# Patient Record
Sex: Male | Born: 1955 | Race: White | Hispanic: No | Smoking: Current every day smoker
Health system: Southern US, Community
[De-identification: ages and names within clinical notes are randomized; demographics above are authoritative.]

## PROBLEM LIST (undated history)

## (undated) DIAGNOSIS — N179 Acute kidney failure, unspecified: Secondary | ICD-10-CM

## (undated) DIAGNOSIS — R188 Other ascites: Secondary | ICD-10-CM

## (undated) DIAGNOSIS — F1020 Alcohol dependence, uncomplicated: Secondary | ICD-10-CM

## (undated) DIAGNOSIS — I313 Pericardial effusion (noninflammatory): Secondary | ICD-10-CM

## (undated) DIAGNOSIS — N390 Urinary tract infection, site not specified: Secondary | ICD-10-CM

## (undated) DIAGNOSIS — C3490 Malignant neoplasm of unspecified part of unspecified bronchus or lung: Secondary | ICD-10-CM

## (undated) DIAGNOSIS — B1920 Unspecified viral hepatitis C without hepatic coma: Secondary | ICD-10-CM

## (undated) DIAGNOSIS — M899 Disorder of bone, unspecified: Secondary | ICD-10-CM

## (undated) DIAGNOSIS — C229 Malignant neoplasm of liver, not specified as primary or secondary: Secondary | ICD-10-CM

## (undated) DIAGNOSIS — K7460 Unspecified cirrhosis of liver: Secondary | ICD-10-CM

## (undated) HISTORY — DX: Unspecified cirrhosis of liver: K74.60

## (undated) HISTORY — DX: Other ascites: R18.8

## (undated) HISTORY — DX: Malignant neoplasm of liver, not specified as primary or secondary: C22.9

## (undated) HISTORY — DX: Malignant neoplasm of unspecified part of unspecified bronchus or lung: C34.90

---

## 2013-02-07 ENCOUNTER — Inpatient Hospital Stay (HOSPITAL_COMMUNITY)
Admission: EM | Admit: 2013-02-07 | Discharge: 2013-02-13 | DRG: 315 | Disposition: A | Discharge status: Home / Self Care | Payer: BC Managed Care – PPO | Attending: Internal Medicine | Admitting: Internal Medicine

## 2013-02-07 ENCOUNTER — Inpatient Hospital Stay (HOSPITAL_COMMUNITY): Payer: BC Managed Care – PPO

## 2013-02-07 ENCOUNTER — Emergency Department (HOSPITAL_COMMUNITY): Payer: BC Managed Care – PPO

## 2013-02-07 ENCOUNTER — Encounter (HOSPITAL_COMMUNITY): Payer: Self-pay | Admitting: Emergency Medicine

## 2013-02-07 DIAGNOSIS — F1010 Alcohol abuse, uncomplicated: Secondary | ICD-10-CM

## 2013-02-07 DIAGNOSIS — N179 Acute kidney failure, unspecified: Secondary | ICD-10-CM

## 2013-02-07 DIAGNOSIS — K529 Noninfective gastroenteritis and colitis, unspecified: Secondary | ICD-10-CM

## 2013-02-07 DIAGNOSIS — M899 Disorder of bone, unspecified: Secondary | ICD-10-CM

## 2013-02-07 DIAGNOSIS — I313 Pericardial effusion (noninflammatory): Secondary | ICD-10-CM

## 2013-02-07 DIAGNOSIS — K920 Hematemesis: Secondary | ICD-10-CM | POA: Diagnosis present

## 2013-02-07 DIAGNOSIS — R188 Other ascites: Secondary | ICD-10-CM | POA: Diagnosis present

## 2013-02-07 DIAGNOSIS — I318 Other specified diseases of pericardium: Secondary | ICD-10-CM

## 2013-02-07 DIAGNOSIS — N390 Urinary tract infection, site not specified: Secondary | ICD-10-CM

## 2013-02-07 DIAGNOSIS — F10239 Alcohol dependence with withdrawal, unspecified: Secondary | ICD-10-CM | POA: Diagnosis present

## 2013-02-07 DIAGNOSIS — D696 Thrombocytopenia, unspecified: Secondary | ICD-10-CM | POA: Diagnosis present

## 2013-02-07 DIAGNOSIS — F17200 Nicotine dependence, unspecified, uncomplicated: Secondary | ICD-10-CM | POA: Diagnosis present

## 2013-02-07 DIAGNOSIS — R079 Chest pain, unspecified: Secondary | ICD-10-CM

## 2013-02-07 DIAGNOSIS — M898X9 Other specified disorders of bone, unspecified site: Secondary | ICD-10-CM

## 2013-02-07 DIAGNOSIS — I3139 Other pericardial effusion (noninflammatory): Secondary | ICD-10-CM

## 2013-02-07 DIAGNOSIS — B1920 Unspecified viral hepatitis C without hepatic coma: Secondary | ICD-10-CM | POA: Diagnosis present

## 2013-02-07 DIAGNOSIS — K7030 Alcoholic cirrhosis of liver without ascites: Secondary | ICD-10-CM | POA: Diagnosis present

## 2013-02-07 DIAGNOSIS — R791 Abnormal coagulation profile: Secondary | ICD-10-CM | POA: Diagnosis not present

## 2013-02-07 DIAGNOSIS — J9819 Other pulmonary collapse: Secondary | ICD-10-CM | POA: Diagnosis present

## 2013-02-07 DIAGNOSIS — R7989 Other specified abnormal findings of blood chemistry: Secondary | ICD-10-CM

## 2013-02-07 DIAGNOSIS — F1020 Alcohol dependence, uncomplicated: Secondary | ICD-10-CM | POA: Diagnosis present

## 2013-02-07 DIAGNOSIS — J90 Pleural effusion, not elsewhere classified: Secondary | ICD-10-CM | POA: Diagnosis present

## 2013-02-07 DIAGNOSIS — R700 Elevated erythrocyte sedimentation rate: Secondary | ICD-10-CM

## 2013-02-07 DIAGNOSIS — I319 Disease of pericardium, unspecified: Principal | ICD-10-CM | POA: Diagnosis present

## 2013-02-07 DIAGNOSIS — F10939 Alcohol use, unspecified with withdrawal, unspecified: Secondary | ICD-10-CM | POA: Diagnosis present

## 2013-02-07 HISTORY — DX: Disorder of bone, unspecified: M89.9

## 2013-02-07 HISTORY — DX: Pericardial effusion (noninflammatory): I31.3

## 2013-02-07 HISTORY — DX: Acute kidney failure, unspecified: N17.9

## 2013-02-07 HISTORY — DX: Other pericardial effusion (noninflammatory): I31.39

## 2013-02-07 HISTORY — DX: Other specified disorders of bone, unspecified site: M89.8X9

## 2013-02-07 HISTORY — DX: Urinary tract infection, site not specified: N39.0

## 2013-02-07 HISTORY — DX: Unspecified viral hepatitis C without hepatic coma: B19.20

## 2013-02-07 HISTORY — DX: Alcohol dependence, uncomplicated: F10.20

## 2013-02-07 LAB — CBC
MCHC: 35.1 g/dL (ref 30.0–36.0)
Platelets: 183 10*3/uL (ref 150–400)
RBC: 4.06 MIL/uL — ABNORMAL LOW (ref 4.22–5.81)
RDW: 12.7 % (ref 11.5–15.5)

## 2013-02-07 LAB — CBC WITH DIFFERENTIAL/PLATELET
Eosinophils Absolute: 0 10*3/uL (ref 0.0–0.7)
Eosinophils Relative: 0 % (ref 0–5)
HCT: 46.4 % (ref 39.0–52.0)
Hemoglobin: 15.6 g/dL (ref 13.0–17.0)
Lymphocytes Relative: 5 % — ABNORMAL LOW (ref 12–46)
Lymphs Abs: 1.1 10*3/uL (ref 0.7–4.0)
MCH: 33.8 pg (ref 26.0–34.0)
MCV: 100.7 fL — ABNORMAL HIGH (ref 78.0–100.0)
Monocytes Absolute: 1.3 10*3/uL — ABNORMAL HIGH (ref 0.1–1.0)
Platelets: 229 10*3/uL (ref 150–400)
RDW: 12.6 % (ref 11.5–15.5)
WBC: 24.2 10*3/uL — ABNORMAL HIGH (ref 4.0–10.5)

## 2013-02-07 LAB — TROPONIN I
Troponin I: <0.3 ng/mL (ref <0.30)
Troponin I: <0.3 ng/mL (ref <0.30)
Troponin I: <0.3 ng/mL (ref <0.30)

## 2013-02-07 LAB — COMPREHENSIVE METABOLIC PANEL
ALT: 152 U/L — ABNORMAL HIGH (ref 0–53)
Calcium: 9.1 mg/dL (ref 8.4–10.5)
Creatinine, Ser: 1.95 mg/dL — ABNORMAL HIGH (ref 0.50–1.35)
GFR calc Af Amer: 43 mL/min — ABNORMAL LOW (ref >90)
GFR calc non Af Amer: 37 mL/min — ABNORMAL LOW (ref >90)
Glucose, Bld: 196 mg/dL — ABNORMAL HIGH (ref 70–99)
Sodium: 133 mEq/L — ABNORMAL LOW (ref 135–145)
Total Protein: 8.2 g/dL (ref 6.0–8.3)

## 2013-02-07 LAB — LIPASE, BLOOD: Lipase: 15 U/L (ref 11–59)

## 2013-02-07 LAB — URINALYSIS, ROUTINE W REFLEX MICROSCOPIC
Bilirubin Urine: NEGATIVE
Leukocytes, UA: NEGATIVE
Nitrite: POSITIVE — AB
Protein, ur: 30 mg/dL — AB
Urobilinogen, UA: 4 mg/dL — ABNORMAL HIGH (ref 0.0–1.0)
pH: 5 (ref 5.0–8.0)

## 2013-02-07 LAB — URINE MICROSCOPIC-ADD ON

## 2013-02-07 MED ORDER — ONDANSETRON HCL 4 MG PO TABS: 4.0000 mg | ORAL_TABLET | Freq: Four times a day (QID) | ORAL | Status: DC | PRN

## 2013-02-07 MED ORDER — SODIUM CHLORIDE 0.9 % IJ SOLN
3.0000 mL | Freq: Two times a day (BID) | INTRAMUSCULAR | Status: DC
  Administered 2013-02-07: 3 mL via INTRAVENOUS
  Administered 2013-02-08: 11:00:00 via INTRAVENOUS
  Administered 2013-02-08 – 2013-02-12 (×4): 3 mL via INTRAVENOUS

## 2013-02-07 MED ORDER — HYDROCODONE-ACETAMINOPHEN 5-325 MG PO TABS: 1.0000 | ORAL_TABLET | ORAL | Status: DC | PRN

## 2013-02-07 MED ORDER — SODIUM CHLORIDE 0.9 % IV SOLN: INTRAVENOUS | Status: DC

## 2013-02-07 MED ORDER — SODIUM CHLORIDE 0.9 % IV SOLN
250.0000 mL | INTRAVENOUS | Status: DC | PRN
  Administered 2013-02-07: 250 mL via INTRAVENOUS

## 2013-02-07 MED ORDER — SODIUM CHLORIDE 0.9 % IV BOLUS (SEPSIS)
1000.0000 mL | Freq: Once | INTRAVENOUS | Status: AC
  Administered 2013-02-07: 1000 mL via INTRAVENOUS

## 2013-02-07 MED ORDER — FOLIC ACID 1 MG PO TABS
1.0000 mg | ORAL_TABLET | Freq: Every day | ORAL | Status: DC
  Administered 2013-02-07 – 2013-02-13 (×7): 1 mg via ORAL
  Filled 2013-02-07 (×7): qty 1

## 2013-02-07 MED ORDER — SODIUM CHLORIDE 0.9 % IJ SOLN
3.0000 mL | Freq: Two times a day (BID) | INTRAMUSCULAR | Status: DC
  Administered 2013-02-09 – 2013-02-12 (×3): 3 mL via INTRAVENOUS

## 2013-02-07 MED ORDER — PANTOPRAZOLE SODIUM 40 MG IV SOLR
40.0000 mg | Freq: Once | INTRAVENOUS | Status: AC
  Administered 2013-02-07: 40 mg via INTRAVENOUS
  Filled 2013-02-07: qty 40

## 2013-02-07 MED ORDER — ONDANSETRON HCL 4 MG/2ML IJ SOLN: 4.0000 mg | Freq: Four times a day (QID) | INTRAMUSCULAR | Status: DC | PRN

## 2013-02-07 MED ORDER — PANTOPRAZOLE SODIUM 40 MG IV SOLR
40.0000 mg | Freq: Two times a day (BID) | INTRAVENOUS | Status: DC
  Administered 2013-02-07 – 2013-02-09 (×4): 40 mg via INTRAVENOUS
  Filled 2013-02-07 (×5): qty 40

## 2013-02-07 MED ORDER — VITAMIN B-1 100 MG PO TABS
100.0000 mg | ORAL_TABLET | Freq: Every day | ORAL | Status: DC
  Administered 2013-02-07 – 2013-02-13 (×7): 100 mg via ORAL
  Filled 2013-02-07 (×7): qty 1

## 2013-02-07 MED ORDER — IOHEXOL 300 MG/ML  SOLN
50.0000 mL | Freq: Once | INTRAMUSCULAR | Status: AC | PRN
  Administered 2013-02-07: 50 mL via ORAL

## 2013-02-07 MED ORDER — DEXTROSE 5 % IV SOLN
1.0000 g | INTRAVENOUS | Status: DC
  Administered 2013-02-07 – 2013-02-12 (×6): 1 g via INTRAVENOUS
  Filled 2013-02-07 (×8): qty 10

## 2013-02-07 MED ORDER — HYDROMORPHONE HCL PF 1 MG/ML IJ SOLN
1.0000 mg | Freq: Once | INTRAMUSCULAR | Status: AC
  Administered 2013-02-07: 1 mg via INTRAVENOUS
  Filled 2013-02-07: qty 1

## 2013-02-07 MED ORDER — ONDANSETRON HCL 4 MG/2ML IJ SOLN
4.0000 mg | Freq: Once | INTRAMUSCULAR | Status: AC
  Administered 2013-02-07: 4 mg via INTRAVENOUS
  Filled 2013-02-07: qty 2

## 2013-02-07 MED ORDER — LORAZEPAM 2 MG/ML IJ SOLN
2.0000 mg | INTRAMUSCULAR | Status: DC | PRN
  Administered 2013-02-08: 2 mg via INTRAVENOUS
  Filled 2013-02-07: qty 1

## 2013-02-07 MED ORDER — SODIUM CHLORIDE 0.9 % IJ SOLN: 3.0000 mL | INTRAMUSCULAR | Status: DC | PRN

## 2013-02-07 MED ORDER — DOCUSATE SODIUM 100 MG PO CAPS
100.0000 mg | ORAL_CAPSULE | Freq: Two times a day (BID) | ORAL | Status: DC
  Administered 2013-02-07 – 2013-02-13 (×5): 100 mg via ORAL
  Filled 2013-02-07 (×13): qty 1

## 2013-02-07 NOTE — ED Notes (Addendum)
**Note De-Identified Strupp Obfuscation** Has had upper chest rash x 8-10 years.  Last night at about 2100 had sudden onset of sharp pain in upper chest and nausea. Saw some dark brown particles in emesis. States "feels like a bear is inside of me". Has been unable to keep anything down.  Diaphoresis and lightheadedness.

## 2013-02-07 NOTE — ED Notes (Signed)
**Note De-identified [place]an Obfuscation** Patient left with [facility] at this time 

## 2013-02-07 NOTE — ED Notes (Signed)
**Note De-identified [place] Obfuscation** [facility] at bedside 

## 2013-02-07 NOTE — H&P (Signed)
**Note De-Identified [place] Obfuscation** PCP:  No PCP Per Patient    Chief Complaint:  Chest pain  HPI: Marc Wilson is a 57 y.o. male   has a past medical history of Alcoholism.   Presented with  For the past 3-4 months he has not been feeling well. He does not go to the doctor. Reports drinking 12 beers a day but ready to quit. Stopped smoking yesterday. Yesterday his nausea got worse he attributes that to increased cough. When he vomitted he noted vomit to be black. Denies frank blood. Denies melena. This was an isolated event.  He started to have severe chest pain at night after he was doing a lot of strenuous activity at work. This was associated with some shortness of breath. Pain was non-positional. He presented to [facility] and had a CT scan done that showed no PE but was worrisome for pericardial effusion. Pain is now greatly improved. Pateint was transferred to [facility] stepdown.  He reports occasional shakes he he has not had enough alcohol. He never tried to quit. He reports last etOH was 3 days ago.   Review of Systems:     Pertinent positives include:  chest pain, shortness of breath at rest. nausea, vomiting,  Constitutional:  No weight loss, night sweats, Fevers, chills, fatigue, weight loss  HEENT:  No headaches, Difficulty swallowing,Tooth/dental problems,Sore throat,  No sneezing, itching, ear ache, nasal congestion, post nasal drip,  Cardio-vascular:  No Orthopnea, PND, anasarca, dizziness, palpitations.no Bilateral lower extremity swelling  GI:  No heartburn, indigestion, abdominal pain,  diarrhea, change in bowel habits, loss of appetite, melena, blood in stool, hematemesis Resp:  no  No dyspnea on exertion, No excess mucus, no productive cough, No non-productive cough, No coughing up of blood.No change in color of mucus.No wheezing. Skin:  no rash or lesions. No jaundice GU:  no dysuria, change in color of urine, no urgency or frequency. No straining to urinate.  No flank pain.  Musculoskeletal:  No joint pain  or no joint swelling. No decreased range of motion. No back pain.  Psych:  No change in mood or affect. No depression or anxiety. No memory loss.  Neuro: no localizing neurological complaints, no tingling, no weakness, no double vision, no gait abnormality, no slurred speech, no confusion  Otherwise ROS are negative except for above, 10 systems were reviewed  Past Medical History: Past Medical History  Diagnosis Date  . Alcoholism    History reviewed. No pertinent past surgical history.   Medications: Prior to Admission medications   Medication Sig Start Date End Date Taking? Authorizing Provider  Aspirin-Caffeine (BAYER BACK & BODY PAIN EX ST PO) Take 2 tablets by mouth 2 (two) times daily as needed (pain).   Yes Historical Provider, MD    Allergies:  No Known Allergies  Social History:  Ambulatory independently   Lives at   Home with family   reports that he quit smoking yesterday. His smoking use included Cigarettes. He smoked 2.00 packs per day. He does not have any smokeless tobacco history on file. He reports that he drinks about 7.2 ounces of alcohol per week. He reports that he uses illicit drugs (Marijuana).   Family History: family history includes Alcoholism in his father; Diabetes type II in his father and mother.    Physical Exam: Patient Vitals for the past 24 hrs:  BP Temp Temp src Pulse Resp SpO2 Height Weight  02/07/13 2000 126/94 mmHg - - 97 24 94 % - -  02/07/13 1955 - **Note De-Identified Memmott Obfuscation** 99 F (37.2 C) Oral - - - - -  02/07/13 1900 124/83 mmHg 98.8 F (37.1 C) Oral 103 19 89 % 5\' 9"  (1.753 m) 83 kg (182 lb 15.7 oz)  02/07/13 1800 129/84 mmHg - - 97 25 88 % - -  02/07/13 1700 103/90 mmHg - - 99 24 91 % - -  02/07/13 1617 116/94 mmHg - - 101 22 93 % - -  02/07/13 1303 106/90 mmHg - - 105 22 93 % - -  02/07/13 1300 110/84 mmHg - - 101 25 92 % - -  02/07/13 1027 102/77 mmHg 97.7 F (36.5 C) Oral 113 17 98 % 5\' 9"  (1.753 m) 77.111 kg (170 lb)    1. General:  in No Acute  distress 2. Psychological: Alert and Oriented 3. Head/ENT:   Moist  Mucous Membranes                          Head Non traumatic, neck supple                           Poor Dentition 4. SKIN:   decreased Skin turgor,  Skin clean Dry and intact, areas of white plaques over chest head and neck 5. Heart: Regular rate and rhythm no Murmur, Rub or gallop 6. Lungs:   no wheezes occasional crackles, somewhat distant   7. Abdomen: Soft, non-tender, Non distended 8. Lower extremities: no clubbing, cyanosis, or edema 9. Neurologically Grossly intact, moving all 4 extremities equally, no tremor 10. MSK: Normal range of motion  body mass index is 27.01 kg/(m^2).   Labs on Admission:   Recent Labs  02/07/13 1051  NA 133*  K 4.4  CL 91*  CO2 21  GLUCOSE 196*  BUN 19  CREATININE 1.95*  CALCIUM 9.1    Recent Labs  02/07/13 1051  AST 337*  ALT 152*  ALKPHOS 99  BILITOT 0.9  PROT 8.2  ALBUMIN 3.4*    Recent Labs  02/07/13 1051  LIPASE 15    Recent Labs  02/07/13 1051  WBC 24.2*  NEUTROABS 21.8*  HGB 15.6  HCT 46.4  MCV 100.7*  PLT 229    Recent Labs  02/07/13 1051 02/07/13 1457  TROPONINI <0.30 <0.30   No results found for this basename: TSH, T4TOTAL, FREET3, T3FREE, THYROIDAB,  in the last 72 hours No results found for this basename: VITAMINB12, FOLATE, FERRITIN, TIBC, IRON, RETICCTPCT,  in the last 72 hours No results found for this basename: HGBA1C    Estimated Creatinine Clearance: 43.3 ml/min (by C-G formula based on Cr of 1.95). ABG No results found for this basename: phart, pco2, po2, hco3, tco2, acidbasedef, o2sat     No results found for this basename: DDIMER     Other results:  I have pearsonaly reviewed this: ECG REPORT  Rate: 108  Rhythm: sinus tachycardia ST&T Change: flattened t waves  UA evidence of UTI   Cultures: No results found for this basename: sdes, specrequest, cult, reptstatus       Radiological Exams on  Admission: Ct Abdomen Pelvis Wo Contrast  02/07/2013   CLINICAL DATA:  Epigastric pain  EXAM: CT ABDOMEN AND PELVIS WITHOUT CONTRAST  TECHNIQUE: Multidetector CT imaging of the abdomen and pelvis was performed following the standard protocol without intravenous contrast.  COMPARISON:  None.  FINDINGS: Study is limited without IV contrast. Bilateral small pleural effusion left greater than right **Note De-Identified [place]ift Obfuscation** with bilateral lower lobe posterior atelectasis. There is mild thickening of distal esophageal wall. Gastroesophageal reflux cannot be excluded.  There is moderate pericardial effusion measures up to 2 cm in thickness anteriorly. There is some thickening of pericardial wall. Inflammatory changes cannot be excluded. Clinical correlation is necessary.  The liver shows a micronodular contour suspicious for cirrhosis. Small perihepatic and perisplenic ascites. There is significant thickening of gallbladder wall up to 8 mm. No pericholecystic fluid is noted. Further evaluation with gallbladder ultrasound is recommended.  Nonspecific bilateral mild perinephric stranding. No nephrolithiasis. No hydronephrosis or hydroureter. Small amount of stranding and fluid noted bilateral paracolic gutter. Moderate stool noted in right colon. There is no pericecal inflammation. Some ascites is noted in right lower quadrant. Normal appendix partially visualized in axial image 53. There is moderate pelvic ascites. No small bowel obstruction. Atherosclerotic calcifications of abdominal aorta and iliac arteries. No free abdominal air. Bilateral inguinal scrotal carinal small hernia containing fat without evidence of acute complication. There is thickening of urinary bladder wall. Chronic inflammation or mild cystitis cannot be excluded. Prostate gland measures 4 x 3.2 cm. No destructive bony lesions are noted within pelvis.  Sagittal images of the spine shows degenerative changes lumbar spine. There is partial bony fusion T12-L1 vertebral body.  Mild compression deformity upper endplate of L1 of indeterminate age. Clinical correlation is necessary. There is subtle lytic lesion left aspect of L3 vertebral body measures about 1.7 cm. Best visualized on sagittal image 54. Degenerative changes are noted right anterior superior acetabulum.  IMPRESSION: 1. Bilateral small pleural effusion left greater than right with bilateral lower lobe posterior atelectasis. Moderate pericardial effusion measures up to 2 cm anteriorly. There is some thickening of pericardial wall. Inflammatory changes cannot be excluded. Clinical correlation is necessary. 2. Bilateral perihepatic and perisplenic ascites. Micro nodular liver contour suspicious for cirrhosis. 3. Thickening of gallbladder wall up to 8 mm. Further correlation with gallbladder ultrasound is recommended. No calcified gallstones are noted within gallbladder. 4. Small ascites noted bilateral paracolic gutters. Small ascites noted right lower quadrant of the abdomen. Normal appendix partially visualized. 5. Moderate pelvic ascites. No small bowel or colonic obstruction. 6. Mild thickening of urinary bladder wall. Cystitis or chronic inflammatory changes cannot be excluded. 7. Nonspecific lytic appearance of L3 vertebral body. Further correlation with bone scan is suggested to exclude metastatic disease.   Electronically Signed   By: Natasha Mead M.D.   On: 02/07/2013 14:14   Dg Chest 2 View  02/07/2013   CLINICAL DATA:  Chest pain  EXAM: CHEST  2 VIEW  COMPARISON:  None.  FINDINGS: Cardiac shadow is within normal limits. Bilateral small pleural effusions and atelectatic changes are noted. No other focal abnormality is seen.  IMPRESSION: Bibasilar changes as described.   Electronically Signed   By: Alcide Clever M.D.   On: 02/07/2013 13:58   Ct Chest Wo Contrast  02/07/2013   CLINICAL DATA:  Complaints of chest pain times 15 hr, and shortness of breath, and leg swelling  EXAM: CT CHEST WITHOUT CONTRAST  TECHNIQUE:  Multidetector CT imaging of the chest was performed following the standard protocol without IV contrast.  COMPARISON:  Abdominal CT dated 02/07/2013  FINDINGS: The thoracic inlet is unremarkable. A moderate sized pericardial effusion is appreciated as described previously measuring up to 2 cm in thickness. There is no evidence of a thoracic aortic aneurysm. Very mild mural calcifications identified within the proximal descending aorta. Small bilateral pleural effusions are identified left-greater-than-right. The **Note De-Identified Hevener Obfuscation** lung parenchyma demonstrates areas of mild ground-glass density within the dependent portions of the lung bases. A focal area of bullous change projects within the posterior lateral aspect of the superior segment right lower lobe. No further focal regions of consolidation or focal infiltrates are appreciated.  The visualized upper abdominal viscera as described on recent CT abdomen and pelvis.  IMPRESSION: 1. Moderate-sized pericardial effusion. 2. Small bilateral pleural effusions left greater than right. 3. Likely dependent atelectasis within the lung bases.   Electronically Signed   By: Salome Holmes M.D.   On: 02/07/2013 15:06    Chart has been reviewed  Assessment/Plan 57 yo m w hx of ETOH abuse here with chest pain and pericardial effusion and episode of possible coffee ground emmesis.   Present on Admission:  . Chest pain - in the setting of emesis and pericardial effusion, ECG not consistent with pericarditis, will monitor on tele in step down, cycle CE, 2D echo in AM to eval the effusion, Cardiology consult has been called . Pericardial effusion - etiology unclear, hx of ETOH, given possible gi bleed will hold off on NSAIDS . Acute renal failure - not sure what is his baseline, ascites and pulmonary effusions present but peripherally fluid down and hx of nausea vomiting poor po intake. Will obtain urine electrolytes.  Renal US. Check orthostatics and if postiive can give gentle IV .  Alcohol abuse - CIWA protocol interested in quitting. Social work consult . Lytic lesion of bone on x-ray - spoke to radiology who is not very impressed, this can be followed up in the future . Coffee ground emesis - one episode with hx of EtOH abuse and vomiting. hemodynamically stable. INR 1.36, GI consult has been called [facility] will see in AM.  . Liver cirrhosis, alcoholic - interested in quitting.  UTI -  Treat with rocephin, await results of urine culture  Prophylaxis: SCD   Protonix  CODE STATUS: FULL CODE  Other plan as per orders.  I have spent a total of 70 min on this admission time taken to discuss care of with [facility] as well as [facility]  Lorielle Boehning 02/07/2013, 10:23 PM

## 2013-02-07 NOTE — ED Provider Notes (Addendum)
**Note De-Identified [place] [facility]bfuscation** CSN: [id number]     Arrival date & time 02/07/13  1021 History  This chart was scribed for Shelda Jakes, MD by Leone Payor, ED Scribe. This patient was seen in room APA01/[facility]01 and the patient's care was started 11:33 AM.    Chief Complaint  Patient presents with  . Chest Pain  . Rash    The history is provided by the patient. No language interpreter was used.    HPI Comments: Marc Wilson is a 57 y.o. male who presents to the Emergency Department complaining of constant, unchanged, chest pain that began 15 hours ago. Pt states he had the chest pain for about 3 hours before he began to have abdominal pain, vomiting and diarrhea. Pt states he had 5-6 episodes of vomiting yesterday and a couple of episodes today. He rates the chest pain as 8/10 currently, 10/10 at max. He also has associated SOB and lightheadedness last night. Pt states he had a strenuous work shift last night. He denies cough, leg swelling, HAs.   Pt also complains of a chronic rash to the chest for many years. He describes this rash as white spots that do not itch or have drainage.    History reviewed. No pertinent past medical history. History reviewed. No pertinent past surgical history. History reviewed. No pertinent family history. History  Substance Use Topics  . Smoking status: Former Smoker -- 2.00 packs/day    Types: Cigarettes    Quit date: 02/06/2013  . Smokeless tobacco: Not on file  . Alcohol Use: 7.2 oz/week    12 Cans of beer per week     Comment: beers daily x 40 years    Review of Systems  Constitutional: Negative for fever and chills.  HENT: Negative for rhinorrhea and sore throat.   Eyes: Positive for visual disturbance.  Respiratory: Positive for shortness of breath. Negative for cough.   Cardiovascular: Positive for chest pain. Negative for leg swelling.  Gastrointestinal: Positive for nausea, vomiting, abdominal pain and diarrhea.  Genitourinary: Negative for dysuria.   Musculoskeletal: Negative for back pain and neck pain.  Skin: Positive for rash.  Neurological: Positive for light-headedness. Negative for headaches.  Hematological: Does not bruise/bleed easily.  Psychiatric/Behavioral: Negative for confusion.    Allergies  Review of patient's allergies indicates no known allergies.  Home Medications   Current Outpatient Rx  Name  Route  Sig  Dispense  Refill  . Aspirin-Caffeine ([facility] BACK & BODY PAIN EX ST PO)   Oral   Take 2 tablets by mouth 2 (two) times daily as needed (pain).          BP 106/90  Pulse 105  Temp(Src) 97.7 F (36.5 C) (Oral)  Resp 22  Ht 5\' 9"  (1.753 m)  Wt 170 lb (77.111 kg)  BMI 25.09 kg/m2  SpO2 93% Physical Exam  Nursing note and vitals reviewed. Constitutional: He is oriented to person, place, and time. He appears well-developed and well-nourished.  HENT:  Head: Normocephalic and atraumatic.  Cardiovascular: Regular rhythm and normal heart sounds.  Tachycardia present.   Pulmonary/Chest: Effort normal and breath sounds normal. No respiratory distress. He has no wheezes. He has no rales. He exhibits no tenderness.  Abdominal: Soft. Bowel sounds are normal. He exhibits no distension. There is no tenderness.  Neurological: He is alert and oriented to person, place, and time. No cranial nerve deficit.  Skin: Skin is warm and dry.  Nodules measuring anywhere from  0.5 cm to 1 cm. White in **Note De-Identified [place] Obfuscation** nature. Non-erythematous and non-tender.   Psychiatric: He has a normal mood and affect.    ED Course  Procedures   DIAGNOSTIC STUDIES: Oxygen Saturation is 98% on RA, normal by my interpretation.    COORDINATION OF CARE: 11:33 AM Will order CBC, CMP, lipase, troponin. Discussed treatment plan with pt at bedside and pt agreed to plan.    Labs Review Labs Reviewed  CBC WITH DIFFERENTIAL - Abnormal; Notable for the following:    WBC 24.2 (*)    MCV 100.7 (*)    Neutrophils Relative % 90 (*)    Neutro Abs 21.8 (*)     Lymphocytes Relative 5 (*)    Monocytes Absolute 1.3 (*)    All other components within normal limits  COMPREHENSIVE METABOLIC PANEL - Abnormal; Notable for the following:    Sodium 133 (*)    Chloride 91 (*)    Glucose, Bld 196 (*)    Creatinine, Ser 1.95 (*)    Albumin 3.4 (*)    AST 337 (*)    ALT 152 (*)    GFR calc non Af Amer 37 (*)    GFR calc Af Amer 43 (*)    All other components within normal limits  PROTIME-INR - Abnormal; Notable for the following:    Prothrombin Time 16.4 (*)    All other components within normal limits  LIPASE, BLOOD  TROPONIN I  TROPONIN I  URINALYSIS, ROUTINE W REFLEX MICROSCOPIC  AMMONIA   Results for orders placed during the hospital encounter of 02/07/13  CBC WITH DIFFERENTIAL      Result Value Range   WBC 24.2 (*) 4.0 - 10.5 K/uL   RBC 4.61  4.22 - 5.81 MIL/uL   Hemoglobin 15.6  13.0 - 17.0 g/dL   HCT 16.1  09.6 - 04.5 %   MCV 100.7 (*) 78.0 - 100.0 fL   MCH 33.8  26.0 - 34.0 pg   MCHC 33.6  30.0 - 36.0 g/dL   RDW 40.9  81.1 - 91.4 %   Platelets 229  150 - 400 K/uL   Neutrophils Relative % 90 (*) 43 - 77 %   Neutro Abs 21.8 (*) 1.7 - 7.7 K/uL   Lymphocytes Relative 5 (*) 12 - 46 %   Lymphs Abs 1.1  0.7 - 4.0 K/uL   Monocytes Relative 5  3 - 12 %   Monocytes Absolute 1.3 (*) 0.1 - 1.0 K/uL   Eosinophils Relative 0  0 - 5 %   Eosinophils Absolute 0.0  0.0 - 0.7 K/uL   Basophils Relative 0  0 - 1 %   Basophils Absolute 0.0  0.0 - 0.1 K/uL  COMPREHENSIVE METABOLIC PANEL      Result Value Range   Sodium 133 (*) 135 - 145 mEq/L   Potassium 4.4  3.5 - 5.1 mEq/L   Chloride 91 (*) 96 - 112 mEq/L   CO2 21  19 - 32 mEq/L   Glucose, Bld 196 (*) 70 - 99 mg/dL   BUN 19  6 - 23 mg/dL   Creatinine, Ser 7.82 (*) 0.50 - 1.35 mg/dL   Calcium 9.1  8.4 - 95.6 mg/dL   Total Protein 8.2  6.0 - 8.3 g/dL   Albumin 3.4 (*) 3.5 - 5.2 g/dL   AST 213 (*) 0 - 37 U/L   ALT 152 (*) 0 - 53 U/L   Alkaline Phosphatase 99  39 - 117 U/L   Total Bilirubin  0.9  0.3 - **Note De-Identified [place] Obfuscation** 1.2 mg/dL   GFR calc non Af Amer 37 (*) >90 mL/min   GFR calc Af Amer 43 (*) >90 mL/min  LIPASE, BLOOD      Result Value Range   Lipase 15  11 - 59 U/L  TROPONIN I      Result Value Range   Troponin I <0.30  <0.30 ng/mL  PROTIME-INR      Result Value Range   Prothrombin Time 16.4 (*) 11.6 - 15.2 seconds   INR 1.36  0.00 - 1.49    Imaging Review Ct Abdomen Pelvis Wo Contrast  02/07/2013   CLINICAL DATA:  Epigastric pain  EXAM: CT ABDOMEN AND PELVIS WITHOUT CONTRAST  TECHNIQUE: Multidetector CT imaging of the abdomen and pelvis was performed following the standard protocol without intravenous contrast.  COMPARISON:  None.  FINDINGS: Study is limited without IV contrast. Bilateral small pleural effusion left greater than right with bilateral lower lobe posterior atelectasis. There is mild thickening of distal esophageal wall. Gastroesophageal reflux cannot be excluded.  There is moderate pericardial effusion measures up to 2 cm in thickness anteriorly. There is some thickening of pericardial wall. Inflammatory changes cannot be excluded. Clinical correlation is necessary.  The liver shows a micronodular contour suspicious for cirrhosis. Small perihepatic and perisplenic ascites. There is significant thickening of gallbladder wall up to 8 mm. No pericholecystic fluid is noted. Further evaluation with gallbladder ultrasound is recommended.  Nonspecific bilateral mild perinephric stranding. No nephrolithiasis. No hydronephrosis or hydroureter. Small amount of stranding and fluid noted bilateral paracolic gutter. Moderate stool noted in right colon. There is no pericecal inflammation. Some ascites is noted in right lower quadrant. Normal appendix partially visualized in axial image 53. There is moderate pelvic ascites. No small bowel obstruction. Atherosclerotic calcifications of abdominal aorta and iliac arteries. No free abdominal air. Bilateral inguinal scrotal carinal small hernia containing  fat without evidence of acute complication. There is thickening of urinary bladder wall. Chronic inflammation or mild cystitis cannot be excluded. Prostate gland measures 4 x 3.2 cm. No destructive bony lesions are noted within pelvis.  Sagittal images of the spine shows degenerative changes lumbar spine. There is partial bony fusion T12-L1 vertebral body. Mild compression deformity upper endplate of L1 of indeterminate age. Clinical correlation is necessary. There is subtle lytic lesion left aspect of L3 vertebral body measures about 1.7 cm. Best visualized on sagittal image 54. Degenerative changes are noted right anterior superior acetabulum.  IMPRESSION: 1. Bilateral small pleural effusion left greater than right with bilateral lower lobe posterior atelectasis. Moderate pericardial effusion measures up to 2 cm anteriorly. There is some thickening of pericardial wall. Inflammatory changes cannot be excluded. Clinical correlation is necessary. 2. Bilateral perihepatic and perisplenic ascites. Micro nodular liver contour suspicious for cirrhosis. 3. Thickening of gallbladder wall up to 8 mm. Further correlation with gallbladder ultrasound is recommended. No calcified gallstones are noted within gallbladder. 4. Small ascites noted bilateral paracolic gutters. Small ascites noted right lower quadrant of the abdomen. Normal appendix partially visualized. 5. Moderate pelvic ascites. No small bowel or colonic obstruction. 6. Mild thickening of urinary bladder wall. Cystitis or chronic inflammatory changes cannot be excluded. 7. Nonspecific lytic appearance of L3 vertebral body. Further correlation with bone scan is suggested to exclude metastatic disease.   Electronically Signed   By: Natasha Mead M.D.   On: 02/07/2013 14:14   Dg Chest 2 View  02/07/2013   CLINICAL DATA:  Chest pain  EXAM: CHEST  2 VIEW  COMPARISON: **Note De-Identified [place] Obfuscation** None.  FINDINGS: Cardiac shadow is within normal limits. Bilateral small pleural effusions and  atelectatic changes are noted. No other focal abnormality is seen.  IMPRESSION: Bibasilar changes as described.   Electronically Signed   By: Alcide Clever M.D.   On: 02/07/2013 13:58   Ct Chest Wo Contrast  02/07/2013   CLINICAL DATA:  Complaints of chest pain times 15 hr, and shortness of breath, and leg swelling  EXAM: CT CHEST WITHOUT CONTRAST  TECHNIQUE: Multidetector CT imaging of the chest was performed following the standard protocol without IV contrast.  COMPARISON:  Abdominal CT dated 02/07/2013  FINDINGS: The thoracic inlet is unremarkable. A moderate sized pericardial effusion is appreciated as described previously measuring up to 2 cm in thickness. There is no evidence of a thoracic aortic aneurysm. Very mild mural calcifications identified within the proximal descending aorta. Small bilateral pleural effusions are identified left-greater-than-right. The lung parenchyma demonstrates areas of mild ground-glass density within the dependent portions of the lung bases. A focal area of bullous change projects within the posterior lateral aspect of the superior segment right lower lobe. No further focal regions of consolidation or focal infiltrates are appreciated.  The visualized upper abdominal viscera as described on recent CT abdomen and pelvis.  IMPRESSION: 1. Moderate-sized pericardial effusion. 2. Small bilateral pleural effusions left greater than right. 3. Likely dependent atelectasis within the lung bases.   Electronically Signed   By: Salome Holmes M.D.   On: 02/07/2013 15:06    EKG Interpretation     Ventricular Rate:  108 PR Interval:  116 QRS Duration: 90 QT Interval:  354 QTC Calculation: 474 R Axis:   71 Text Interpretation:  Sinus tachycardia Nonspecific T wave abnormality Abnormal ECG No previous ECGs available           CRITICAL CARE Performed by: Shelda Jakes. Total critical care time: 35 Critical care time was exclusive of separately billable procedures and  treating other patients. Critical care was necessary to treat or prevent imminent or life-threatening deterioration. Critical care was time spent personally by me on the following activities: development of treatment plan with patient and/or surrogate as well as nursing, discussions with consultants, evaluation of patient's response to treatment, examination of patient, obtaining history from patient or surrogate, ordering and performing treatments and interventions, ordering and review of laboratory studies, ordering and review of radiographic studies, pulse oximetry and re-evaluation of patient's condition.   MDM   1. Chest pain   2. Gastroenteritis    Patient improved steadily here in the emergency department. Patient most likely probably has cirrhosis concern is for the pleural effusions and the pericardial effusion patient in no acute distress even though heart rate is around 113. Initial troponin was negative chest pain started 15 hours ago not likely an acute cardiac event. And no evidence of significant pericarditis could expect the troponin perhaps to be elevated. We'll discuss with the hospitalist team to see their recommendation.  Patient's other symptoms with the vomiting and some mild loose, but seems to be consistent with a gastroenteritis that occurred overnight. Patient does not have a primary care Dr. There is a marked leukocytosis. Specific etiology of that is not clear.  I personally performed the services described in this documentation, which was scribed in my presence. The recorded information has been reviewed and is accurate.    Shelda Jakes, MD 02/07/13 1526  Discussed with hospitalist service here they recommend admission at [facility] due to the moderate pericardial effusion. Will **Note De-Identified [place] Obfuscation** contact the hospitalist service there and arrange for transfer. Patient's second troponin was negative so no evidence of acute cardiac event. However this could be a viral  pericarditis.  Shelda Jakes, MD 02/07/13 [phone]  Discussed with hospitalist team at [facility] they will accept a step down unit. Concern is the patient's moderate pericardial effusion. Patient's second troponin was negative no evidence of acute cardiac event. Admitting team requested that we contact cardiothoracic consult put in to them awaiting their call back. Transfer paperwork and temporary middle orders completed.  Shelda Jakes, MD 02/07/13 [phone]

## 2013-02-08 DIAGNOSIS — R700 Elevated erythrocyte sedimentation rate: Secondary | ICD-10-CM

## 2013-02-08 DIAGNOSIS — K920 Hematemesis: Secondary | ICD-10-CM

## 2013-02-08 DIAGNOSIS — I319 Disease of pericardium, unspecified: Secondary | ICD-10-CM

## 2013-02-08 DIAGNOSIS — F1010 Alcohol abuse, uncomplicated: Secondary | ICD-10-CM

## 2013-02-08 DIAGNOSIS — R079 Chest pain, unspecified: Secondary | ICD-10-CM

## 2013-02-08 DIAGNOSIS — R7989 Other specified abnormal findings of blood chemistry: Secondary | ICD-10-CM

## 2013-02-08 DIAGNOSIS — K7030 Alcoholic cirrhosis of liver without ascites: Secondary | ICD-10-CM

## 2013-02-08 LAB — HEPATITIS PANEL, ACUTE
Hep A IgM: NONREACTIVE
Hep B C IgM: NONREACTIVE
Hepatitis B Surface Ag: NEGATIVE

## 2013-02-08 LAB — COMPREHENSIVE METABOLIC PANEL
ALT: 165 U/L — ABNORMAL HIGH (ref 0–53)
AST: 277 U/L — ABNORMAL HIGH (ref 0–37)
Albumin: 2.9 g/dL — ABNORMAL LOW (ref 3.5–5.2)
Alkaline Phosphatase: 92 U/L (ref 39–117)
BUN: 34 mg/dL — ABNORMAL HIGH (ref 6–23)
CO2: 23 mEq/L (ref 19–32)
Chloride: 93 mEq/L — ABNORMAL LOW (ref 96–112)
GFR calc non Af Amer: 30 mL/min — ABNORMAL LOW (ref >90)
Potassium: 4.2 mEq/L (ref 3.5–5.1)
Total Bilirubin: 0.6 mg/dL (ref 0.3–1.2)

## 2013-02-08 LAB — RAPID URINE DRUG SCREEN, HOSP PERFORMED
Amphetamines: NOT DETECTED
Barbiturates: NOT DETECTED
Tetrahydrocannabinol: NOT DETECTED

## 2013-02-08 LAB — CBC
HCT: 35.3 % — ABNORMAL LOW (ref 39.0–52.0)
Hemoglobin: 14 g/dL (ref 13.0–17.0)
Hemoglobin: 12.2 g/dL — ABNORMAL LOW (ref 13.0–17.0)
MCH: 34.3 pg — ABNORMAL HIGH (ref 26.0–34.0)
MCH: 33.9 pg (ref 26.0–34.0)
MCHC: 34.6 g/dL (ref 30.0–36.0)
MCV: 98.8 fL (ref 78.0–100.0)
Platelets: 181 10*3/uL (ref 150–400)
Platelets: 158 10*3/uL (ref 150–400)
RBC: 4.13 MIL/uL — ABNORMAL LOW (ref 4.22–5.81)
RBC: 3.64 MIL/uL — ABNORMAL LOW (ref 4.22–5.81)
RDW: 12.9 % (ref 11.5–15.5)
RDW: 12.5 % (ref 11.5–15.5)
WBC: 17 10*3/uL — ABNORMAL HIGH (ref 4.0–10.5)
WBC: 12.4 10*3/uL — ABNORMAL HIGH (ref 4.0–10.5)

## 2013-02-08 LAB — TSH: TSH: 2.493 u[IU]/mL (ref 0.350–4.500)

## 2013-02-08 LAB — SEDIMENTATION RATE: Sed Rate: 38 mm/hr — ABNORMAL HIGH (ref 0–16)

## 2013-02-08 LAB — CREATININE, URINE, RANDOM: Creatinine, Urine: 181.57 mg/dL

## 2013-02-08 LAB — SODIUM, URINE, RANDOM: Sodium, Ur: 19 mEq/L

## 2013-02-08 LAB — PHOSPHORUS: Phosphorus: 4.8 mg/dL — ABNORMAL HIGH (ref 2.3–4.6)

## 2013-02-08 MED ORDER — SODIUM CHLORIDE 0.9 % IV SOLN: INTRAVENOUS | Status: AC

## 2013-02-08 MED ORDER — SODIUM CHLORIDE 0.9 % IV SOLN
INTRAVENOUS | Status: DC
  Administered 2013-02-09: 20:00:00 via INTRAVENOUS

## 2013-02-08 NOTE — Progress Notes (Signed)
**Note De-Identified [place] Obfuscation** SUBJECTIVE: Pt denies any chest pain and shortness of breath. Says he feels a lot better than 2 nights ago when he had significant chest pain and shortness of breath. Echocardiogram hasn't been performed yet.   Intake/Output Summary (Last 24 hours) at 02/08/13 0849 Last data filed at 02/08/13 0800  Gross per 24 hour  Intake  807.5 ml  Output    200 ml  Net  607.5 ml    Current Facility-Administered Medications  Medication Dose Route Frequency Provider Last Rate Last Dose  . 0.9 %  sodium chloride infusion  250 mL Intravenous PRN Therisa Doyne, MD   250 mL at 02/07/13 2300  . cefTRIAXone (ROCEPHIN) 1 g in dextrose 5 % 50 mL IVPB  1 g Intravenous Q24H Therisa Doyne, MD   1 g at 02/07/13 2300  . docusate sodium (COLACE) capsule 100 mg  100 mg Oral BID Therisa Doyne, MD   100 mg at 02/07/13 2259  . folic acid (FOLVITE) tablet 1 mg  1 mg Oral Daily Therisa Doyne, MD   1 mg at 02/07/13 2259  . HYDROcodone-acetaminophen (NORCO/VICODIN) 5-325 MG per tablet 1-2 tablet  1-2 tablet Oral Q4H PRN Therisa Doyne, MD      . LORazepam (ATIVAN) injection 2-3 mg  2-3 mg Intravenous Q1H PRN Therisa Doyne, MD      . ondansetron (ZOFRAN) tablet 4 mg  4 mg Oral Q6H PRN Therisa Doyne, MD       Or  . ondansetron (ZOFRAN) injection 4 mg  4 mg Intravenous Q6H PRN Therisa Doyne, MD      . pantoprazole (PROTONIX) injection 40 mg  40 mg Intravenous Q12H Therisa Doyne, MD   40 mg at 02/07/13 2300  . sodium chloride 0.9 % injection 3 mL  3 mL Intravenous Q12H Anastassia Doutova, MD      . sodium chloride 0.9 % injection 3 mL  3 mL Intravenous Q12H Therisa Doyne, MD   3 mL at 02/07/13 2300  . sodium chloride 0.9 % injection 3 mL  3 mL Intravenous PRN Therisa Doyne, MD      . thiamine (VITAMIN B-1) tablet 100 mg  100 mg Oral Daily Therisa Doyne, MD   100 mg at 02/07/13 2259    Filed Vitals:   02/08/13 0200 02/08/13 0400 02/08/13 0600 02/08/13 0800    BP: 125/80 121/83 131/87 136/91  Pulse: 87 90 94 95  Temp:  98 F (36.7 C)    TempSrc:  Oral    Resp: 20 21 24 18   Height:      Weight:      SpO2: 95% 94% 96% 95%    PHYSICAL EXAM General: NAD Neck: +JVD to angle of jaw, no thyromegaly or thyroid nodule.  Lungs: Clear to auscultation bilaterally with normal respiratory effort. CV: Nondisplaced PMI.  Regular rhythm, normal S1/S2, no S3/S4, no murmur.  No pretibial edema.  No carotid bruit.  Normal pedal pulses.  Abdomen: Soft, nontender, no hepatosplenomegaly, no distention.  Neurologic: Alert and oriented x 3.  Psych: Normal affect. Extremities: No clubbing or cyanosis.   TELEMETRY: Reviewed telemetry pt in sinus rhythm, 94 bpm.  LABS: Basic Metabolic Panel:  Recent Labs  16/10/96 1051 02/08/13 0435  NA 133* 130*  K 4.4 4.2  CL 91* 93*  CO2 21 23  GLUCOSE 196* 91  BUN 19 34*  CREATININE 1.95* 2.32*  CALCIUM 9.1 8.3*  MG  --  2.0  PHOS  --  4.8* **Note De-Identified [place] Obfuscation** Liver Function Tests:  Recent Labs  02/07/13 1051 02/08/13 0435  AST 337* 277*  ALT 152* 165*  ALKPHOS 99 92  BILITOT 0.9 0.6  PROT 8.2 7.1  ALBUMIN 3.4* 2.9*    Recent Labs  02/07/13 1051  LIPASE 15   CBC:  Recent Labs  02/07/13 1051 02/07/13 2106 02/08/13 0435  WBC 24.2* 22.8* 19.7*  NEUTROABS 21.8*  --   --   HGB 15.6 14.0 14.1  HCT 46.4 39.9 40.6  MCV 100.7* 98.3 98.8  PLT 229 183 181   Cardiac Enzymes:  Recent Labs  02/07/13 1457 02/07/13 2101 02/08/13 0435  TROPONINI <0.30 <0.30 <0.30   BNP: No components found with this basename: POCBNP,  D-Dimer: No results found for this basename: DDIMER,  in the last 72 hours Hemoglobin A1C: No results found for this basename: HGBA1C,  in the last 72 hours Fasting Lipid Panel: No results found for this basename: CHOL, HDL, LDLCALC, TRIG, CHOLHDL, LDLDIRECT,  in the last 72 hours Thyroid Function Tests: No results found for this basename: TSH, T4TOTAL, FREET3, T3FREE, THYROIDAB,  in the  last 72 hours Anemia Panel: No results found for this basename: VITAMINB12, FOLATE, FERRITIN, TIBC, IRON, RETICCTPCT,  in the last 72 hours  RADIOLOGY: Ct Abdomen Pelvis Wo Contrast  02/07/2013   CLINICAL DATA:  Epigastric pain  EXAM: CT ABDOMEN AND PELVIS WITHOUT CONTRAST  TECHNIQUE: Multidetector CT imaging of the abdomen and pelvis was performed following the standard protocol without intravenous contrast.  COMPARISON:  None.  FINDINGS: Study is limited without IV contrast. Bilateral small pleural effusion left greater than right with bilateral lower lobe posterior atelectasis. There is mild thickening of distal esophageal wall. Gastroesophageal reflux cannot be excluded.  There is moderate pericardial effusion measures up to 2 cm in thickness anteriorly. There is some thickening of pericardial wall. Inflammatory changes cannot be excluded. Clinical correlation is necessary.  The liver shows a micronodular contour suspicious for cirrhosis. Small perihepatic and perisplenic ascites. There is significant thickening of gallbladder wall up to 8 mm. No pericholecystic fluid is noted. Further evaluation with gallbladder ultrasound is recommended.  Nonspecific bilateral mild perinephric stranding. No nephrolithiasis. No hydronephrosis or hydroureter. Small amount of stranding and fluid noted bilateral paracolic gutter. Moderate stool noted in right colon. There is no pericecal inflammation. Some ascites is noted in right lower quadrant. Normal appendix partially visualized in axial image 53. There is moderate pelvic ascites. No small bowel obstruction. Atherosclerotic calcifications of abdominal aorta and iliac arteries. No free abdominal air. Bilateral inguinal scrotal carinal small hernia containing fat without evidence of acute complication. There is thickening of urinary bladder wall. Chronic inflammation or mild cystitis cannot be excluded. Prostate gland measures 4 x 3.2 cm. No destructive bony lesions are  noted within pelvis.  Sagittal images of the spine shows degenerative changes lumbar spine. There is partial bony fusion T12-L1 vertebral body. Mild compression deformity upper endplate of L1 of indeterminate age. Clinical correlation is necessary. There is subtle lytic lesion left aspect of L3 vertebral body measures about 1.7 cm. Best visualized on sagittal image 54. Degenerative changes are noted right anterior superior acetabulum.  IMPRESSION: 1. Bilateral small pleural effusion left greater than right with bilateral lower lobe posterior atelectasis. Moderate pericardial effusion measures up to 2 cm anteriorly. There is some thickening of pericardial wall. Inflammatory changes cannot be excluded. Clinical correlation is necessary. 2. Bilateral perihepatic and perisplenic ascites. Micro nodular liver contour suspicious for cirrhosis. 3. Thickening of gallbladder wall **Note De-Identified [place] Obfuscation** up to 8 mm. Further correlation with gallbladder ultrasound is recommended. No calcified gallstones are noted within gallbladder. 4. Small ascites noted bilateral paracolic gutters. Small ascites noted right lower quadrant of the abdomen. Normal appendix partially visualized. 5. Moderate pelvic ascites. No small bowel or colonic obstruction. 6. Mild thickening of urinary bladder wall. Cystitis or chronic inflammatory changes cannot be excluded. 7. Nonspecific lytic appearance of L3 vertebral body. Further correlation with bone scan is suggested to exclude metastatic disease.   Electronically Signed   By: Natasha Mead M.D.   On: 02/07/2013 14:14   Dg Chest 2 View  02/07/2013   CLINICAL DATA:  Chest pain  EXAM: CHEST  2 VIEW  COMPARISON:  None.  FINDINGS: Cardiac shadow is within normal limits. Bilateral small pleural effusions and atelectatic changes are noted. No other focal abnormality is seen.  IMPRESSION: Bibasilar changes as described.   Electronically Signed   By: Alcide Clever M.D.   On: 02/07/2013 13:58   Ct Chest Wo Contrast  02/07/2013    CLINICAL DATA:  Complaints of chest pain times 15 hr, and shortness of breath, and leg swelling  EXAM: CT CHEST WITHOUT CONTRAST  TECHNIQUE: Multidetector CT imaging of the chest was performed following the standard protocol without IV contrast.  COMPARISON:  Abdominal CT dated 02/07/2013  FINDINGS: The thoracic inlet is unremarkable. A moderate sized pericardial effusion is appreciated as described previously measuring up to 2 cm in thickness. There is no evidence of a thoracic aortic aneurysm. Very mild mural calcifications identified within the proximal descending aorta. Small bilateral pleural effusions are identified left-greater-than-right. The lung parenchyma demonstrates areas of mild ground-glass density within the dependent portions of the lung bases. A focal area of bullous change projects within the posterior lateral aspect of the superior segment right lower lobe. No further focal regions of consolidation or focal infiltrates are appreciated.  The visualized upper abdominal viscera as described on recent CT abdomen and pelvis.  IMPRESSION: 1. Moderate-sized pericardial effusion. 2. Small bilateral pleural effusions left greater than right. 3. Likely dependent atelectasis within the lung bases.   Electronically Signed   By: Salome Holmes M.D.   On: 02/07/2013 15:06   US Renal  02/08/2013   CLINICAL DATA:  Renal failure; elevated creatinine.  EXAM: RENAL/URINARY TRACT ULTRASOUND COMPLETE  COMPARISON:  CT of the abdomen and pelvis performed earlier today at 1:57 p.m.  FINDINGS: Right Kidney  Length: 12.4 cm. Mildly increased parenchymal echogenicity. No mass or hydronephrosis visualized.  Left Kidney  Length: 13.2 cm. Mildly increased parenchymal echogenicity. No mass or hydronephrosis visualized.  Bladder  Appears normal for degree of bladder distention.  Note is made of small volume abdominopelvic ascites. Small bilateral pleural effusions are seen, left greater than right.  IMPRESSION: 1. Mildly  increased renal parenchymal echogenicity likely reflects medical renal disease. No evidence of hydronephrosis. 2. Small volume abdominopelvic ascites again noted; small bilateral pleural effusions, left greater than right.   Electronically Signed   By: Roanna Raider M.D.   On: 02/08/2013 04:14      ASSESSMENT AND PLAN: 57 y.o. male w/ PMHx significant for alcoholism, possibly hep C who presented to [facility] on 02/08/2013 with complaints of chest discomfort, and found to have large pericardial effusion by CT and increased pulsus by exam.   1. Pericardial effusion: echocardiogram is pending and will be reviewed to determine if a pericardial drain vs window is needed. He is hemodynamically stable at present, and asymptomatic. Continue **Note De-Identified [place] Obfuscation** to keep NPO (reinforced with patient and nurse). With regards to the etiology, history and workup suggestive of several possibilities including infectious (HIV, hep C). CT imaging mentioned lytic lesion in L3 raising the possibility of malignancy. Screening for inflammatory causes and metabolic causes is ongoing with TSH and CRP, and ESR is found to be elevated. Idiopathic is a final diagnosis of exclusion. If tap performed, further analysis of the fluid will also help with narrowing down etiology.     Prentice Docker, M.D., F.A.C.C.

## 2013-02-08 NOTE — Consult Note (Signed)
**Note De-Identified [place] Obfuscation** CARDIOLOGY CONSULT NOTE  Patient ID: Marc Wilson, MRN: [id number], DOB/AGE: 1955-12-14 57 y.o. Admit date: 02/07/2013 Date of Consult: 02/08/2013  Primary Physician: No PCP Per Patient Primary Cardiologist: none  Chief Complaint: chest discomfort Reason for Consultation: pericardial effusion  HPI: 57 y.o. male w/ PMHx significant for alcoholism, possibly hep C who presented to [facility] on 02/08/2013 with complaints of chest discomfort. Describes it as a sharp pain in his upper chest. Associated with nausea and had  emesis that had flecks of brown. Felt sweaty and lightheaded. Also with abdominal pain and diarrhea. Pain worse with strenous activity.  Workup by the ER included a noncontrast chest CT which demonstrated a 2 cm anterior pericardial effusion, small pleural effusions, cirrhotic liver, gallbladder thickening, mod pelvic ascites, ?lytic lesion of L3?  Admitted to the ICU.   Currently, he is chest pain free. He denies any cardiac history previously. Denies any recent illness or fevers. Unsure about positional nature of symptoms. No syncope.  Past Medical History  Diagnosis Date  . Alcoholism     H/o back fracture  Surgical History: History reviewed. No pertinent past surgical history.   Home Meds: Prior to Admission medications   Medication Sig Start Date End Date Taking? Authorizing Provider  Aspirin-Caffeine (BAYER BACK & BODY PAIN EX ST PO) Take 2 tablets by mouth 2 (two) times daily as needed (pain).   Yes Historical Provider, MD    Inpatient Medications:  . cefTRIAXone (ROCEPHIN) IVPB 1 gram/50 mL D5W  1 g Intravenous Q24H  . docusate sodium  100 mg Oral BID  . folic acid  1 mg Oral Daily  . pantoprazole (PROTONIX) IV  40 mg Intravenous Q12H  . sodium chloride  3 mL Intravenous Q12H  . sodium chloride  3 mL Intravenous Q12H  . thiamine  100 mg Oral Daily   . sodium chloride      Allergies: No Known Allergies  History   Social History  . Marital  Status: Legally Separated    Spouse Name: N/A    Number of Children: N/A  . Years of Education: N/A   Occupational History  . Not on file.   Social History Main Topics  . Smoking status: Former Smoker -- 2.00 packs/day    Types: Cigarettes    Quit date: 02/06/2013  . Smokeless tobacco: Not on file  . Alcohol Use: 7.2 oz/week    12 Cans of beer per week     Comment: 12 beers daily x 40 years  . Drug Use: Yes    Special: Marijuana  . Sexual Activity: Not on file   Other Topics Concern  . Not on file   Social History Narrative  . No narrative on file     Family History  Problem Relation Age of Onset  . Diabetes type II Mother   . Diabetes type II Father   . Alcoholism Father      Review of Systems: General: negative for chills, fever, night sweats or weight changes.  Cardiovascular: see HPI Dermatological: negative for rash Respiratory: negative for cough or wheezing Urologic: negative for hematuria Abdominal: negative for nausea, vomiting, diarrhea, bright red blood per rectum, melena, or hematemesis Neurologic: negative for visual changes, syncope, or dizziness All other systems reviewed and are otherwise negative except as noted above.  Labs:  Recent Labs  02/07/13 1051 02/07/13 1457 02/07/13 2101  TROPONINI <0.30 <0.30 <0.30   Lab Results  Component Value Date   WBC 22.8* 02/07/2013 **Note De-Identified [place] Obfuscation** HGB 14.0 02/07/2013   HCT 39.9 02/07/2013   MCV 98.3 02/07/2013   PLT 183 02/07/2013    Recent Labs Lab 02/07/13 1051  NA 133*  K 4.4  CL 91*  CO2 21  BUN 19  CREATININE 1.95*  CALCIUM 9.1  PROT 8.2  BILITOT 0.9  ALKPHOS 99  ALT 152*  AST 337*  GLUCOSE 196*   No results found for this basename: CHOL, HDL, LDLCALC, TRIG   No results found for this basename: DDIMER    Radiology/Studies:  Ct Abdomen Pelvis Wo Contrast  02/07/2013   CLINICAL DATA:  Epigastric pain  EXAM: CT ABDOMEN AND PELVIS WITHOUT CONTRAST  TECHNIQUE: Multidetector CT imaging of the abdomen  and pelvis was performed following the standard protocol without intravenous contrast.  COMPARISON:  None.  FINDINGS: Study is limited without IV contrast. Bilateral small pleural effusion left greater than right with bilateral lower lobe posterior atelectasis. There is mild thickening of distal esophageal wall. Gastroesophageal reflux cannot be excluded.  There is moderate pericardial effusion measures up to 2 cm in thickness anteriorly. There is some thickening of pericardial wall. Inflammatory changes cannot be excluded. Clinical correlation is necessary.  The liver shows a micronodular contour suspicious for cirrhosis. Small perihepatic and perisplenic ascites. There is significant thickening of gallbladder wall up to 8 mm. No pericholecystic fluid is noted. Further evaluation with gallbladder ultrasound is recommended.  Nonspecific bilateral mild perinephric stranding. No nephrolithiasis. No hydronephrosis or hydroureter. Small amount of stranding and fluid noted bilateral paracolic gutter. Moderate stool noted in right colon. There is no pericecal inflammation. Some ascites is noted in right lower quadrant. Normal appendix partially visualized in axial image 53. There is moderate pelvic ascites. No small bowel obstruction. Atherosclerotic calcifications of abdominal aorta and iliac arteries. No free abdominal air. Bilateral inguinal scrotal carinal small hernia containing fat without evidence of acute complication. There is thickening of urinary bladder wall. Chronic inflammation or mild cystitis cannot be excluded. Prostate gland measures 4 x 3.2 cm. No destructive bony lesions are noted within pelvis.  Sagittal images of the spine shows degenerative changes lumbar spine. There is partial bony fusion T12-L1 vertebral body. Mild compression deformity upper endplate of L1 of indeterminate age. Clinical correlation is necessary. There is subtle lytic lesion left aspect of L3 vertebral body measures about 1.7 cm.  Best visualized on sagittal image 54. Degenerative changes are noted right anterior superior acetabulum.  IMPRESSION: 1. Bilateral small pleural effusion left greater than right with bilateral lower lobe posterior atelectasis. Moderate pericardial effusion measures up to 2 cm anteriorly. There is some thickening of pericardial wall. Inflammatory changes cannot be excluded. Clinical correlation is necessary. 2. Bilateral perihepatic and perisplenic ascites. Micro nodular liver contour suspicious for cirrhosis. 3. Thickening of gallbladder wall up to 8 mm. Further correlation with gallbladder ultrasound is recommended. No calcified gallstones are noted within gallbladder. 4. Small ascites noted bilateral paracolic gutters. Small ascites noted right lower quadrant of the abdomen. Normal appendix partially visualized. 5. Moderate pelvic ascites. No small bowel or colonic obstruction. 6. Mild thickening of urinary bladder wall. Cystitis or chronic inflammatory changes cannot be excluded. 7. Nonspecific lytic appearance of L3 vertebral body. Further correlation with bone scan is suggested to exclude metastatic disease.   Electronically Signed   By: Natasha Mead M.D.   On: 02/07/2013 14:14   Dg Chest 2 View  02/07/2013   CLINICAL DATA:  Chest pain  EXAM: CHEST  2 VIEW  COMPARISON:  None. **Note De-Identified [place] Obfuscation** FINDINGS: Cardiac shadow is within normal limits. Bilateral small pleural effusions and atelectatic changes are noted. No other focal abnormality is seen.  IMPRESSION: Bibasilar changes as described.   Electronically Signed   By: Alcide Clever M.D.   On: 02/07/2013 13:58   Ct Chest Wo Contrast  02/07/2013   CLINICAL DATA:  Complaints of chest pain times 15 hr, and shortness of breath, and leg swelling  EXAM: CT CHEST WITHOUT CONTRAST  TECHNIQUE: Multidetector CT imaging of the chest was performed following the standard protocol without IV contrast.  COMPARISON:  Abdominal CT dated 02/07/2013  FINDINGS: The thoracic inlet is  unremarkable. A moderate sized pericardial effusion is appreciated as described previously measuring up to 2 cm in thickness. There is no evidence of a thoracic aortic aneurysm. Very mild mural calcifications identified within the proximal descending aorta. Small bilateral pleural effusions are identified left-greater-than-right. The lung parenchyma demonstrates areas of mild ground-glass density within the dependent portions of the lung bases. A focal area of bullous change projects within the posterior lateral aspect of the superior segment right lower lobe. No further focal regions of consolidation or focal infiltrates are appreciated.  The visualized upper abdominal viscera as described on recent CT abdomen and pelvis.  IMPRESSION: 1. Moderate-sized pericardial effusion. 2. Small bilateral pleural effusions left greater than right. 3. Likely dependent atelectasis within the lung bases.   Electronically Signed   By: Salome Holmes M.D.   On: 02/07/2013 15:06    EKG: sinus tach with nonspec TW changes, no comparison  Physical Exam: Blood pressure 125/80, pulse 87, temperature 97.8 F (36.6 C), temperature source Oral, resp. rate 20, height 5\' 9"  (1.753 m), weight 83 kg (182 lb 15.7 oz), SpO2 95.00%. pulsus paradoxus of approx 10-15 mmHg, possible palpable pulsus? General: no acute distress, chronically ill appearing Head: Normocephalic, atraumatic, , no xanthomas, nares are without discharge.  Neck: Supple. Negative for carotid bruits. JVD to jawline Lungs: crackles at bases Heart: RRR with S1 S2. No murmurs, rubs, or gallops appreciated. Abdomen: Soft, non-tender, non-distended with normoactive bowel sounds. No hepatomegaly. No rebound/guarding. No obvious abdominal masses. Msk:  Strength and tone appear normal for age. Extremities: No clubbing or cyanosis. No edema.  Distal pedal pulses are 2+ and equal bilaterally. Neuro: Alert and oriented X 3. Moves all extremities spontaneously. Psych:   Responds to questions appropriately with a normal affect.   Problem List 1. Pericardial effusion with possible impending tamponade physiology, pulsus paradoxus of 10-15 2. Alcoholism 3. Liver disease 4. Possible h/o hep C, testing pending 5. Renal failure, chronicity unknown 6. Possible upper GI bleed 7. Remote history of IV drug use  Assessment and Plan:  57 y.o. male w/ PMHx significant for alcoholism, possibly hep C who presented to [facility] on 02/08/2013 with complaints of chest discomfort --> found to have large pericardial effusion by CT and increased pulsus by exam.  Physical exam is suggestive that the effusion is hemodynamically significant and further evaluation with echo to determine if pericardial drainage is needed. Encouragingly, his blood pressure and pulse are reasonable and he is asymptomatic. In anticipation of possible urgent procedure would keep NPO. Furthermore, would give IV fluids to maintain RV volume though his elevated JVP suggests that he already has significant volume on board- would limit to 500 ml at 100 ml/hr.  Regarding the etiology, history and workup suggestive of several possibilities including infectious (HIV, hep C). CT imaging mentioned lytic lesion in L3 raising the possibility of malignancy. **Note De-Identified [place]isen Obfuscation** Screening for inflammatory causes and metabolic causes reasonable with TSH, CRP and sed rate. Idiopathic is a final diagnosis of exclusion. If tap performed, further analysis of the fluid will also help with narrowing down etiology.  Remo Summary of recs: -echocardiogram to assess size and hemodynamics of effusion (will assist with expediting in the AM). -keep NPO -500 ml IV fluid -avoid heparin -pericarditis screening labs of TSH, HIV, CRP, sed rate, hepatitis panel -consider malignancy workup due to ?lytic L3 lesion  Thank you for this interesting consult. We will follow up. Please call with questions. Signed, Adolm Joseph, Jarrod Bodkins C. MD 02/08/2013,  3:11 AM

## 2013-02-08 NOTE — Consult Note (Signed)
**Note De-Identified [place] Obfuscation** Patient seen, examined, and I agree with the above documentation, including the assessment and plan Pericardial effusion with dilation of IVC indicating elevated central pressures, no evidence for tamponade at present. Cards following closely should a pericardial window be necessary Unclear if he had hematemesis, but with hx (liver disease, chronic ETOH) EGD before d/c should be considered Elevated LFTs, pattern of AST:ALT could be consistent with alc hepatitis, but passive congestion could also be contributing. R/O viral hepatitis. Cover for ETOH withdrawal

## 2013-02-08 NOTE — Progress Notes (Signed)
**Note De-Identified [place] Obfuscation** Echocardiogram 2D Echocardiogram has been performed.  Marc Wilson 02/08/2013, 8:06 AM

## 2013-02-08 NOTE — Progress Notes (Signed)
**Note De-Identified Flavell Obfuscation** [facility] Progress Note [place] TEAM 1 - Stepdown/ICU TEAM   Marc Wilson ZOX:[id number] DOB: 15-May-1955 DOA: 02/07/2013 PCP: No PCP Per Patient  Brief narrative: 57 y/o alcoholic presenting to the ER for chest pain described as continuous for 15 yrs. He also had abd pain, vomiting (once episode was black) and diarrhea. Work up revealed a moderate sized pericardial effusion. A cardio consult was requested for both chest discomfort and the pericardial effusion. He is currently not in tamponade and chest pain has since resolved. He is an alcoholic who cut back on drinking 3-4 days ago.   Assessment/Plan: Principal Problem:   Chest pain - likely related to vomiting rather than a cardiac cause - cont Protonix -GI following and has placed him on a regular diet today  Active Problems:     Coffee ground emesis - Gi eval adn Protonix as above    Pericardial effusion - moderate - will order ANA -ESR pending     Acute renal failure - Cr rising- cont aggressive dehydration- sp gravity significantly elevated - renal ultrasound is not remarkable  UTI - cont Rocephin and f/u cultures    Alcohol abuse - follow for withdrawal    Lytic lesion of bone on x-ray - check urine and plasma electrophoresis for myeloma - bone scan ordered  - GI eval as above    Liver cirrhosis, alcoholic   Code Status: full code Family Communication: with wife Disposition Plan: follow in sDU today  Consultants: GI  CArdio  Procedures: none  Antibiotics: Rocephin  DVT prophylaxis: sCDs  HPI/Subjective: Pt feels well- no nausea or chest pain. No symptoms of withdrawal.    Objective: Blood pressure 130/81, pulse 95, temperature 99 F (37.2 C), temperature source Oral, resp. rate 23, height 5\' 9"  (1.753 m), weight 83 kg (182 lb 15.7 oz), SpO2 92.00%.  Intake/Output Summary (Last 24 hours) at 02/08/13 1523 Last data filed at 02/08/13 1200  Gross per 24 hour  Intake 1027.5 ml   Output    800 ml  Net  227.5 ml     Exam: General: No acute respiratory distress Lungs: Clear to auscultation bilaterally without wheezes or crackles Cardiovascular: Regular rate and rhythm without murmur gallop or rub normal S1 and S2 Abdomen: Nontender, nondistended, soft, bowel sounds positive, no rebound, no ascites, no appreciable mass Extremities: No significant cyanosis, clubbing, or edema bilateral lower extremities  Data Reviewed: Basic Metabolic Panel:  Recent Labs Lab 02/07/13 1051 02/08/13 0435  NA 133* 130*  K 4.4 4.2  CL 91* 93*  CO2 21 23  GLUCOSE 196* 91  BUN 19 34*  CREATININE 1.95* 2.32*  CALCIUM 9.1 8.3*  MG  --  2.0  PHOS  --  4.8*   Liver Function Tests:  Recent Labs Lab 02/07/13 1051 02/08/13 0435  AST 337* 277*  ALT 152* 165*  ALKPHOS 99 92  BILITOT 0.9 0.6  PROT 8.2 7.1  ALBUMIN 3.4* 2.9*    Recent Labs Lab 02/07/13 1051  LIPASE 15    Recent Labs Lab 02/07/13 1458  AMMONIA 34   CBC:  Recent Labs Lab 02/07/13 1051 02/07/13 2106 02/08/13 0435 02/08/13 1145  WBC 24.2* 22.8* 19.7* 17.0*  NEUTROABS 21.8*  --   --   --   HGB 15.6 14.0 14.1 14.0  HCT 46.4 39.9 40.6 40.3  MCV 100.7* 98.3 98.8 97.6  PLT 229 183 181 158   Cardiac Enzymes:  Recent Labs Lab 02/07/13 1051 02/07/13 1457 02/07/13 2101 02/08/13 0435 **Note De-Identified [name] Obfuscation** 02/08/13 1145  TROPONINI <0.30 <0.30 <0.30 <0.30 <0.30   BNP (last 3 results) No results found for this basename: PROBNP,  in the last 8760 hours CBG: No results found for this basename: GLUCAP,  in the last 168 hours  No results found for this or any previous visit (from the past 240 hour(s)).   Studies:  Recent x-ray studies have been reviewed in detail by the Attending Physician  Scheduled Meds:  Scheduled Meds: . cefTRIAXone (ROCEPHIN) IVPB 1 gram/50 mL D5W  1 g Intravenous Q24H  . docusate sodium  100 mg Oral BID  . folic acid  1 mg Oral Daily  . pantoprazole (PROTONIX) IV  40 mg Intravenous  Q12H  . sodium chloride  3 mL Intravenous Q12H  . sodium chloride  3 mL Intravenous Q12H  . thiamine  100 mg Oral Daily   Continuous Infusions:   Time spent on care of this patient: 35 min   Marc Schauer, MD  [facility] Office  [phone] Pager - Text Page per Loretha Stapler as per below:  On-Call/Text Page:      [email or web address]      password [email or web address]  If 7PM-7AM, please contact night-coverage [email or web address] Password [email or web address] 02/08/2013, 3:23 PM   LOS: 1 day

## 2013-02-08 NOTE — Consult Note (Signed)
**Note De-Identified Dyke Obfuscation** Referring Provider: [facility] Primary Care Physician:  No PCP Per Patient Primary Gastroenterologist: none-  Reason for Consultation: Possible coffee ground emesis   HPI: Marc Wilson is a 57 y.o. male  admitted last evening through the emergency room after presenting with complaints of generally feeling poorly over the past 3-4 weeks then onset of chest pain which was described as sharp and intermittent over the previous 24 hours associated with shortness of breath . Patient has history of chronic EtOH abuse and no regular medical care. He did admit to taking a couple of aspirin every day in addition to drinking at least 12 beers per day. He stated that he had vomited and noticed that this was quite dark. This was one isolated incident. He had no complaints of abdominal pain and had not noted any melena or hematochezia.   workup in the emergency room with CT of the chest and abdomen shows bilateral pleural effusions and a moderate paracardial effusion in addition to abdominal ascites probable cirrhosis with a nodular liver and a somewhat thickwalled gallbladder.   he has been evaluated by cardiology and is felt to have a significant paracardial effusion was some concern for impending tamponade. Echo has just been done and he may need a pericardial window procedure. He has not manifested any evidence of GI bleeding and in fact his hemoglobin is 14 today. MCV is 97 platelet count within normal range at 158, WBC of 17,000. He does have  elevated LFTs with an ALT of 152 and AST of 337. Pt relates one episode of dark emesis Thursday night, but had been drinking coke- no c/o abdominal pain, melena or heme. Denies GERD sxs, no dysphagia. No prior  GI eval .Takes a couple ASA daily.   Past Medical History  Diagnosis Date  . Alcoholism     History reviewed. No pertinent past surgical history.  Prior to Admission medications   Medication Sig Start Date End Date Taking? Authorizing Provider   Aspirin-Caffeine (BAYER BACK & BODY PAIN EX ST PO) Take 2 tablets by mouth 2 (two) times daily as needed (pain).   Yes Historical Provider, MD    Current Facility-Administered Medications  Medication Dose Route Frequency Provider Last Rate Last Dose  . 0.9 %  sodium chloride infusion  250 mL Intravenous PRN Therisa Doyne, MD   250 mL at 02/07/13 2300  . cefTRIAXone (ROCEPHIN) 1 g in dextrose 5 % 50 mL IVPB  1 g Intravenous Q24H Therisa Doyne, MD   1 g at 02/07/13 2300  . docusate sodium (COLACE) capsule 100 mg  100 mg Oral BID Therisa Doyne, MD   100 mg at 02/08/13 1100  . folic acid (FOLVITE) tablet 1 mg  1 mg Oral Daily Therisa Doyne, MD   1 mg at 02/08/13 1100  . HYDROcodone-acetaminophen (NORCO/VICODIN) 5-325 MG per tablet 1-2 tablet  1-2 tablet Oral Q4H PRN Therisa Doyne, MD      . LORazepam (ATIVAN) injection 2-3 mg  2-3 mg Intravenous Q1H PRN Therisa Doyne, MD      . ondansetron (ZOFRAN) tablet 4 mg  4 mg Oral Q6H PRN Therisa Doyne, MD       Or  . ondansetron (ZOFRAN) injection 4 mg  4 mg Intravenous Q6H PRN Therisa Doyne, MD      . pantoprazole (PROTONIX) injection 40 mg  40 mg Intravenous Q12H Therisa Doyne, MD   40 mg at 02/08/13 1100  . sodium chloride 0.9 % injection 3 mL  3 mL Intravenous **Note De-Identified Carfagno Obfuscation** Q12H Therisa Doyne, MD      . sodium chloride 0.9 % injection 3 mL  3 mL Intravenous Q12H Anastassia Doutova, MD      . sodium chloride 0.9 % injection 3 mL  3 mL Intravenous PRN Therisa Doyne, MD      . thiamine (VITAMIN B-1) tablet 100 mg  100 mg Oral Daily Therisa Doyne, MD   100 mg at 02/08/13 1100    Allergies as of 02/07/2013  . (No Known Allergies)    Family History  Problem Relation Age of Onset  . Diabetes type II Mother   . Diabetes type II Father   . Alcoholism Father     History   Social History  . Marital Status: Legally Separated    Spouse Name: N/A    Number of Children: N/A  . Years of Education: N/A    Occupational History  . Not on file.   Social History Main Topics  . Smoking status: Former Smoker -- 2.00 packs/day    Types: Cigarettes    Quit date: 02/06/2013  . Smokeless tobacco: Not on file  . Alcohol Use: 7.2 oz/week    12 Cans of beer per week     Comment: 12 beers daily x 40 years  . Drug Use: Yes    Special: Marijuana  . Sexual Activity: Not on file   Other Topics Concern  . Not on file   Social History Narrative  . No narrative on file    Review of Systems: Pertinent positive and negative review of systems were noted in the above HPI section.  All other review of systems was otherwise negative.  Physical Exam: Vital signs in last 24 hours: Temp:  [97.8 F (36.6 C)-99 F (37.2 C)] 98 F (36.7 C) (11/08 0800) Pulse Rate:  [87-103] 95 (11/08 1200) Resp:  [17-25] 23 (11/08 1200) BP: (103-136)/(80-95) 130/81 mmHg (11/08 1200) SpO2:  [87 %-96 %] 87 % (11/08 1200) Weight:  [182 lb 15.7 oz (83 kg)] 182 lb 15.7 oz (83 kg) (11/07 1900) Last BM Date: 02/08/13 General:   Alert,  Well-developed, well-nourished,WM  pleasant and cooperative in NAD Head:  Normocephalic and atraumatic. Eyes:  Sclera clear, no icterus.   Conjunctiva pink. Ears:  Normal auditory acuity. Nose:  No deformity, discharge,  or lesions. Mouth:  No deformity or lesions.   Neck:  Supple; no masses or thyromegaly. Lungs:  Clear throughout to auscultation.   Decreased BS bases. Heart:  Regular rate and rhythm; no murmurs, clicks, rubs,  or gallops. Abdomen:  Soft,nontender, BS active,nonpalp mass or hsm.   Rectal:  Deferred  Msk:  Symmetrical without gross deformities. . Pulses:  Normal pulses noted. Extremities:  Without clubbing or edema. Neurologic:  Alert and  oriented x4;  grossly normal neurologically. Skin:  Intact without significant lesions or rashes.. Psych:  Alert and cooperative. Normal mood and affect.  Intake/Output from previous day: 11/07 0701 - 11/08 0700 In: 707.5  [P.O.:240; I.V.:417.5; IV Piggyback:50] Out: -  Intake/Output this shift: Total I/O In: 320 [P.O.:220; I.V.:100] Out: 800 [Urine:800]  Lab Results:  Recent Labs  02/07/13 2106 02/08/13 0435 02/08/13 1145  WBC 22.8* 19.7* 17.0*  HGB 14.0 14.1 14.0  HCT 39.9 40.6 40.3  PLT 183 181 158   BMET  Recent Labs  02/07/13 1051 02/08/13 0435  NA 133* 130*  K 4.4 4.2  CL 91* 93*  CO2 21 23  GLUCOSE 196* 91  BUN 19 34*  CREATININE 1.95* 2.32* **Note De-Identified Bors Obfuscation** CALCIUM 9.1 8.3*   LFT  Recent Labs  02/08/13 0435  PROT 7.1  ALBUMIN 2.9*  AST 277*  ALT 165*  ALKPHOS 92  BILITOT 0.6   PT/INR  Recent Labs  02/07/13 1457  LABPROT 16.4*  INR 1.36    IMPRESSION:  #57  57 year old white male alcoholic with single isolated episode of emesis of brown material. It is possible that this was small amount of old blood however he has not manifested any evidence of persistent vomiting or any GI blood loss. Given history of alcoholism and finding of cirrhosis on imaging he will likely need EGD once he is cleared from a cardiac standpoint. #2 significant pericardial effusion, symptomatic-cardiology workup in progress possible pericardial window #3 small bilateral pleural effusions #4 new diagnosis of probable cirrhosis with ascites , no varices seen on CT. #5 transaminitis-rule out EtOH hepatitis, viral hepatitis, or congestive hepatopathy (note dilated IVC)  Plan; #1 no EGD planned this weekend, will consider EGD once he is cleared from a cardiac standpoint #2 serial hemoglobins, and trend LFTs #3 IV Protonix as doing #4 diet as tolerated from GI standpoint #5 need to have discussion regarding need for EtOH abstinence #6 check hepatitis serologies #7 cover for EtOH withdrawal            Amy Esterwood  02/08/2013, 1:13 PM

## 2013-02-09 DIAGNOSIS — M899 Disorder of bone, unspecified: Secondary | ICD-10-CM

## 2013-02-09 LAB — CBC
MCH: 33.8 pg (ref 26.0–34.0)
MCHC: 34 g/dL (ref 30.0–36.0)
MCV: 99.2 fL (ref 78.0–100.0)
Platelets: 146 10*3/uL — ABNORMAL LOW (ref 150–400)
RBC: 3.85 MIL/uL — ABNORMAL LOW (ref 4.22–5.81)
RDW: 12.9 % (ref 11.5–15.5)

## 2013-02-09 LAB — RHEUMATOID FACTOR: Rhuematoid fact SerPl-aCnc: <10 IU/mL (ref <=14)

## 2013-02-09 LAB — COMPREHENSIVE METABOLIC PANEL
ALT: 141 U/L — ABNORMAL HIGH (ref 0–53)
AST: 182 U/L — ABNORMAL HIGH (ref 0–37)
Albumin: 2.6 g/dL — ABNORMAL LOW (ref 3.5–5.2)
CO2: 23 mEq/L (ref 19–32)
Calcium: 7.9 mg/dL — ABNORMAL LOW (ref 8.4–10.5)
Potassium: 3.5 mEq/L (ref 3.5–5.1)
Sodium: 134 mEq/L — ABNORMAL LOW (ref 135–145)
Total Protein: 6.8 g/dL (ref 6.0–8.3)

## 2013-02-09 LAB — URINE CULTURE
Colony Count: NO GROWTH
Culture: NO GROWTH

## 2013-02-09 MED ORDER — PANTOPRAZOLE SODIUM 40 MG PO TBEC
40.0000 mg | DELAYED_RELEASE_TABLET | Freq: Every day | ORAL | Status: DC
  Administered 2013-02-09 – 2013-02-13 (×5): 40 mg via ORAL
  Filled 2013-02-09 (×5): qty 1

## 2013-02-09 NOTE — Progress Notes (Signed)
**Note De-Identified Daher Obfuscation** Subjective:  No overnight events. GI plans to perform EGD this admission once cleared by Cardiology. Pt denies any chest pain, difficulty breathing, abdominal pain, nausea, or vomiting. He states that he is feeling pretty good.   Objective:  Vital Signs in the last 24 hours: Temp:  [98 F (36.7 C)-99 F (37.2 C)] 98.4 F (36.9 C) (11/09 0453) Pulse Rate:  [74-110] 77 (11/09 0600) Resp:  [18-26] 20 (11/09 0600) BP: (108-137)/(65-95) 118/67 mmHg (11/09 0600) SpO2:  [87 %-95 %] 92 % (11/09 0600)  Intake/Output from previous day: 11/08 0701 - 11/09 0700 In: 2200 [P.O.:1000; I.V.:1150; IV Piggyback:50] Out: 2825 [Urine:2825]  Physical Exam: Pt is alert and oriented, NAD HEENT: NCAT Neck: +JVD to angle of jaw Lungs: CTA bilaterally CV: RRR without murmur or gallop Abd: soft, NT, Positive BS, no hepatomegaly Ext: no C/C/E, distal pulses intact and equal Skin: Scattered white nodules present, primarily on the chest   Lab Results:  Recent Labs  02/08/13 2200 02/09/13 0420  WBC 12.4* 11.9*  HGB 12.2* 13.0  PLT 147* 146*    Recent Labs  02/08/13 0435 02/09/13 0420  NA 130* 134*  K 4.2 3.5  CL 93* 98  CO2 23 23  GLUCOSE 91 80  BUN 34* 28*  CREATININE 2.32* 1.55*    Recent Labs  02/08/13 0435 02/08/13 1145  TROPONINI <0.30 <0.30    Cardiac Studies: 02/08/13 2D ECHO Study Conclusions  - Procedure narrative: Technically difficult study, withsuboptimal sound transmission. - Left ventricle: Mild septal and moderate posterior wall hypertrophy. Systolic function was normal. The estimated ejection fraction was in the range of 55% to 60%. Images were inadequate for LV wall motion assessment, but there are no gross regional abnormalities noted. There was an increased relative contribution of atrial contraction to ventricular filling. Doppler parameters are consistent with abnormal left ventricular relaxation (grade 1 diastolic dysfunction). - Aortic valve:  Trileaflet; mildly thickened leaflets. No stenosis or regurgitation. - Left atrium: The atrium was mildly dilated. - Right atrium: Central venous pressure: 10mm Hg (est). - Systemic veins: IVC is dilated. - Pericardium, extracardiac: A small pericardial effusion is seen, although is poorly visualized. There is a very mild degree of right atrial free wall impingement, but no frank collapse of either the right atrium or ventricle (no evidence of tamponade). Would recommend a repeat limited echo to assess effusion in 3 days. Impressions:  - A moderate size pericardial effusion is appreciated, with no current evidence of frank tamponade. Would recommend reassessment in three days with a limited echocardiogram.   Tele: Sinus rhythm, rate 81-83.   Assessment/Plan:  57 y.o. male w/ PMHx significant for alcoholism, possibly hep C who presented to [facility] on 02/08/2013 with complaints of chest discomfort, and found to have large pericardial effusion by CT and increased pulsus by exam.   1. Pericardial effusion: ECHO revealed moderate pericardial effusion w/o tamponade. The etiology could be infectious (Hep C). CT imaging mentioned lytic lesion in L3 raising the possibility of malignancy. TSH was normal. ESR and CRP were both elevated, checking ANA. If tap is required, further analysis of the fluid will also help with narrowing down etiology. He remains hemodynamically stable at present, and is asymptomatic. Will repeat the ECHO in 2 days. Transfer to telemetry today.   2. Hepatitis C: Pt with reported history and Hep C Ab positive this admission. This is a likely cause of his effusions. AST:ALT could also be consistent with EtOH hepatitis, and the pt **Note De-Identified Benito Obfuscation** is a know drinker. GI was consulted for questionable coffee ground emesis and plans for an EGD at some point this admission.   3. EtOH abuse: Pt drinks 12 beers a day.  CIWA protocol in place.   Genelle Gather, MD Internal Medicine  Resident, PGY II [facility] Internal Medicine Program 02/09/2013 7:16 AM

## 2013-02-09 NOTE — Progress Notes (Addendum)
**Note De-Identified Prust Obfuscation** [facility] Progress Note [place] TEAM 1 - Stepdown/ICU TEAM   Cauy Perham ZOX:[id number] DOB: Nov 21, 1955 DOA: 02/07/2013 PCP: No PCP Per Patient  Brief narrative: 57 y/o alcoholic presenting to the ER for chest pain described as continuous for 15 yrs. He also had abd pain, vomiting (once episode was black) and diarrhea. Work up revealed a moderate sized pericardial effusion. A cardio consult was requested for both chest discomfort and the pericardial effusion. He is currently not in tamponade and chest pain has since resolved. He is an alcoholic who cut back on drinking 3-4 days ago.   Assessment/Plan: Principal Problem:   Chest pain - likely related to vomiting rather than a cardiac cause - cont Protonix - tolerating diet without pain or vomiting -GI following  Active Problems:   Coffee ground emesis - Gi eval and Protonix as above    Pericardial effusion - moderate - f/u ANA, quantiferon and Rheum factor -ESR mildly elevated    Acute renal failure - Cr better today -- slow down on hydration from 75 to 50 cc/hr - sp gravity significantly elevated- U sodium 19  - renal ultrasound is not remarkable  UTI - cont Rocephin  - culture negative but will complete a course of antibiotics for 7 days    Alcohol abuse - follow for withdrawal    Lytic lesion of bone on x-ray - check urine and plasma electrophoresis for myeloma - bone scan ordered    Liver cirrhosis, alcoholic and Hep C with Ascites (perhepatic and perisplenic) - Gi work up in process  Code Status: full code Family Communication: with wife on 11/8 Disposition Plan: transfer to tele  Consultants: GI  CArdio  Procedures: none  Antibiotics: Rocephin  DVT prophylaxis: sCDs  HPI/Subjective: Pt feels well- eating well- still no symptoms of withdrawal and no back pain- discussed the lesion on L3 - he tells me he fell and injured his back many years ago.    Objective: Blood pressure 114/76,  pulse 90, temperature 98.8 F (37.1 C), temperature source Oral, resp. rate 20, height 5\' 9"  (1.753 m), weight 83 kg (182 lb 15.7 oz), SpO2 90.00%.  Intake/Output Summary (Last 24 hours) at 02/09/13 1332 Last data filed at 02/09/13 1030  Gross per 24 hour  Intake   2123 ml  Output   2025 ml  Net     98 ml     Exam: General: No acute respiratory distress Lungs: Clear to auscultation bilaterally without wheezes or crackles Cardiovascular: Regular rate and rhythm without murmur gallop or rub normal S1 and S2 Abdomen: Nontender, nondistended, soft, bowel sounds positive, no rebound, no ascites, no appreciable mass Extremities: No significant cyanosis, clubbing, or edema bilateral lower extremities  Data Reviewed: Basic Metabolic Panel:  Recent Labs Lab 02/07/13 1051 02/08/13 0435 02/09/13 0420  NA 133* 130* 134*  K 4.4 4.2 3.5  CL 91* 93* 98  CO2 21 23 23   GLUCOSE 196* 91 80  BUN 19 34* 28*  CREATININE 1.95* 2.32* 1.55*  CALCIUM 9.1 8.3* 7.9*  MG  --  2.0  --   PHOS  --  4.8*  --    Liver Function Tests:  Recent Labs Lab 02/07/13 1051 02/08/13 0435 02/09/13 0420  AST 337* 277* 182*  ALT 152* 165* 141*  ALKPHOS 99 92 111  BILITOT 0.9 0.6 0.4  PROT 8.2 7.1 6.8  ALBUMIN 3.4* 2.9* 2.6*    Recent Labs Lab 02/07/13 1051  LIPASE 15    Recent Labs **Note De-Identified Appenzeller Obfuscation** Lab 02/07/13 1458  AMMONIA 34   CBC:  Recent Labs Lab 02/07/13 1051 02/07/13 2106 02/08/13 0435 02/08/13 1145 02/08/13 2200 02/09/13 0420  WBC 24.2* 22.8* 19.7* 17.0* 12.4* 11.9*  NEUTROABS 21.8*  --   --   --   --   --   HGB 15.6 14.0 14.1 14.0 12.2* 13.0  HCT 46.4 39.9 40.6 40.3 35.3* 38.2*  MCV 100.7* 98.3 98.8 97.6 97.0 99.2  PLT 229 183 181 158 147* 146*   Cardiac Enzymes:  Recent Labs Lab 02/07/13 1051 02/07/13 1457 02/07/13 2101 02/08/13 0435 02/08/13 1145  TROPONINI <0.30 <0.30 <0.30 <0.30 <0.30   BNP (last 3 results) No results found for this basename: PROBNP,  in the last 8760  hours CBG: No results found for this basename: GLUCAP,  in the last 168 hours  No results found for this or any previous visit (from the past 240 hour(s)).   Studies:  Recent x-ray studies have been reviewed in detail by the Attending Physician  Scheduled Meds:  Scheduled Meds: . cefTRIAXone (ROCEPHIN) IVPB 1 gram/50 mL D5W  1 g Intravenous Q24H  . docusate sodium  100 mg Oral BID  . folic acid  1 mg Oral Daily  . pantoprazole  40 mg Oral Daily  . sodium chloride  3 mL Intravenous Q12H  . sodium chloride  3 mL Intravenous Q12H  . thiamine  100 mg Oral Daily   Continuous Infusions: . sodium chloride Stopped (02/09/13 0700)    Time spent on care of this patient: 35 min   Nguyen Butler, MD  [facility] Office  [phone] Pager - Text Page per Loretha Stapler as per below:  On-Call/Text Page:      [email or web address]      password [email or web address]  If 7PM-7AM, please contact night-coverage [email or web address] Password [email or web address] 02/09/2013, 1:32 PM   LOS: 2 days

## 2013-02-09 NOTE — Progress Notes (Signed)
**Note De-Identified Gabrielsen Obfuscation** Progress Note   Subjective  Pt reports he feels "a whole lot better" No complaints today. No n/v, hungry. States cardiology has plans to repeat the ECHO and "there is something on my spine, but I think it is from an old injury"  Re: ETOH.  Reports "been drunk for 40 years".  Knows about Hep C, has never been treated   Objective  Vital signs in last 24 hours: Temp:  [98 F (36.7 C)-99 F (37.2 C)] 98.4 F (36.9 C) (11/09 0800) Pulse Rate:  [74-110] 90 (11/09 0800) Resp:  [19-26] 20 (11/09 0800) BP: (108-137)/(65-95) 114/76 mmHg (11/09 0800) SpO2:  [87 %-95 %] 90 % (11/09 0800) Last BM Date: 02/08/13 Gen: awake, alert, NAD HEENT: anicteric, op clear CV: RRR, no mrg Pulm: CTA b/l Abd: soft, NT/ND, +BS throughout Ext: no c/c/e Neuro: nonfocal   Intake/Output from previous day: 11/08 0701 - 11/09 0700 In: 2200 [P.O.:1000; I.V.:1150; IV Piggyback:50] Out: 2825 [Urine:2825] Intake/Output this shift: Total I/O In: 240 [P.O.:240] Out: -   Lab Results:  Recent Labs  02/08/13 1145 02/08/13 2200 02/09/13 0420  WBC 17.0* 12.4* 11.9*  HGB 14.0 12.2* 13.0  HCT 40.3 35.3* 38.2*  PLT 158 147* 146*   BMET  Recent Labs  02/07/13 1051 02/08/13 0435 02/09/13 0420  NA 133* 130* 134*  K 4.4 4.2 3.5  CL 91* 93* 98  CO2 21 23 23   GLUCOSE 196* 91 80  BUN 19 34* 28*  CREATININE 1.95* 2.32* 1.55*  CALCIUM 9.1 8.3* 7.9*   LFT  Recent Labs  02/09/13 0420  PROT 6.8  ALBUMIN 2.6*  AST 182*  ALT 141*  ALKPHOS 111  BILITOT 0.4   PT/INR  Recent Labs  02/07/13 1457  LABPROT 16.4*  INR 1.36   Hepatitis Panel  Recent Labs  02/08/13 0330  HEPBSAG NEGATIVE  HCVAB Reactive*  HEPAIGM NON REACTIVE  HEPBIGM NON REACTIVE    Studies/Results: Ct Abdomen Pelvis Wo Contrast  02/07/2013   CLINICAL DATA:  Epigastric pain  EXAM: CT ABDOMEN AND PELVIS WITHOUT CONTRAST  TECHNIQUE: Multidetector CT imaging of the abdomen and pelvis was performed following the  standard protocol without intravenous contrast.  COMPARISON:  None.  FINDINGS: Study is limited without IV contrast. Bilateral small pleural effusion left greater than right with bilateral lower lobe posterior atelectasis. There is mild thickening of distal esophageal wall. Gastroesophageal reflux cannot be excluded.  There is moderate pericardial effusion measures up to 2 cm in thickness anteriorly. There is some thickening of pericardial wall. Inflammatory changes cannot be excluded. Clinical correlation is necessary.  The liver shows a micronodular contour suspicious for cirrhosis. Small perihepatic and perisplenic ascites. There is significant thickening of gallbladder wall up to 8 mm. No pericholecystic fluid is noted. Further evaluation with gallbladder ultrasound is recommended.  Nonspecific bilateral mild perinephric stranding. No nephrolithiasis. No hydronephrosis or hydroureter. Small amount of stranding and fluid noted bilateral paracolic gutter. Moderate stool noted in right colon. There is no pericecal inflammation. Some ascites is noted in right lower quadrant. Normal appendix partially visualized in axial image 53. There is moderate pelvic ascites. No small bowel obstruction. Atherosclerotic calcifications of abdominal aorta and iliac arteries. No free abdominal air. Bilateral inguinal scrotal carinal small hernia containing fat without evidence of acute complication. There is thickening of urinary bladder wall. Chronic inflammation or mild cystitis cannot be excluded. Prostate gland measures 4 x 3.2 cm. No destructive bony lesions are noted within pelvis.  Sagittal images of **Note De-Identified Pelaez Obfuscation** the spine shows degenerative changes lumbar spine. There is partial bony fusion T12-L1 vertebral body. Mild compression deformity upper endplate of L1 of indeterminate age. Clinical correlation is necessary. There is subtle lytic lesion left aspect of L3 vertebral body measures about 1.7 cm. Best visualized on sagittal image 54.  Degenerative changes are noted right anterior superior acetabulum.  IMPRESSION: 1. Bilateral small pleural effusion left greater than right with bilateral lower lobe posterior atelectasis. Moderate pericardial effusion measures up to 2 cm anteriorly. There is some thickening of pericardial wall. Inflammatory changes cannot be excluded. Clinical correlation is necessary. 2. Bilateral perihepatic and perisplenic ascites. Micro nodular liver contour suspicious for cirrhosis. 3. Thickening of gallbladder wall up to 8 mm. Further correlation with gallbladder ultrasound is recommended. No calcified gallstones are noted within gallbladder. 4. Small ascites noted bilateral paracolic gutters. Small ascites noted right lower quadrant of the abdomen. Normal appendix partially visualized. 5. Moderate pelvic ascites. No small bowel or colonic obstruction. 6. Mild thickening of urinary bladder wall. Cystitis or chronic inflammatory changes cannot be excluded. 7. Nonspecific lytic appearance of L3 vertebral body. Further correlation with bone scan is suggested to exclude metastatic disease.   Electronically Signed   By: Natasha Mead M.D.   On: 02/07/2013 14:14   Dg Chest 2 View  02/07/2013   CLINICAL DATA:  Chest pain  EXAM: CHEST  2 VIEW  COMPARISON:  None.  FINDINGS: Cardiac shadow is within normal limits. Bilateral small pleural effusions and atelectatic changes are noted. No other focal abnormality is seen.  IMPRESSION: Bibasilar changes as described.   Electronically Signed   By: Alcide Clever M.D.   On: 02/07/2013 13:58   Ct Chest Wo Contrast  02/07/2013   CLINICAL DATA:  Complaints of chest pain times 15 hr, and shortness of breath, and leg swelling  EXAM: CT CHEST WITHOUT CONTRAST  TECHNIQUE: Multidetector CT imaging of the chest was performed following the standard protocol without IV contrast.  COMPARISON:  Abdominal CT dated 02/07/2013  FINDINGS: The thoracic inlet is unremarkable. A moderate sized pericardial  effusion is appreciated as described previously measuring up to 2 cm in thickness. There is no evidence of a thoracic aortic aneurysm. Very mild mural calcifications identified within the proximal descending aorta. Small bilateral pleural effusions are identified left-greater-than-right. The lung parenchyma demonstrates areas of mild ground-glass density within the dependent portions of the lung bases. A focal area of bullous change projects within the posterior lateral aspect of the superior segment right lower lobe. No further focal regions of consolidation or focal infiltrates are appreciated.  The visualized upper abdominal viscera as described on recent CT abdomen and pelvis.  IMPRESSION: 1. Moderate-sized pericardial effusion. 2. Small bilateral pleural effusions left greater than right. 3. Likely dependent atelectasis within the lung bases.   Electronically Signed   By: Salome Holmes M.D.   On: 02/07/2013 15:06   US Renal  02/08/2013   CLINICAL DATA:  Renal failure; elevated creatinine.  EXAM: RENAL/URINARY TRACT ULTRASOUND COMPLETE  COMPARISON:  CT of the abdomen and pelvis performed earlier today at 1:57 p.m.  FINDINGS: Right Kidney  Length: 12.4 cm. Mildly increased parenchymal echogenicity. No mass or hydronephrosis visualized.  Left Kidney  Length: 13.2 cm. Mildly increased parenchymal echogenicity. No mass or hydronephrosis visualized.  Bladder  Appears normal for degree of bladder distention.  Note is made of small volume abdominopelvic ascites. Small bilateral pleural effusions are seen, left greater than right.  IMPRESSION: 1. Mildly increased renal parenchymal **Note De-Identified Devincenzi Obfuscation** echogenicity likely reflects medical renal disease. No evidence of hydronephrosis. 2. Small volume abdominopelvic ascites again noted; small bilateral pleural effusions, left greater than right.   Electronically Signed   By: Roanna Raider M.D.   On: 02/08/2013 04:14      Assessment & Plan  IMPRESSION:  #48 57 year old white male  alcoholic with Hep C and cirrhosis with single isolated episode of emesis of brown material. None since.  Hgb stable #2 Cirrhosis - I discussed this with him today.  1st and foremost, ETOH cessation is paramount.  Has Hep C (will check viral load, I assume it is chronic, active), which could be treated in the future, but not with active ETOH use/abuse.  ANA pending, but I expect AST:ALT elevation are secondary to Hep C and ETOH #3 Pericardial effusion with elevated CVP, cardiology repeat ECHO on day #3 #4 Lytic L3 bone lesion   Plan: #1 EGD recommended prior to d/c; I do not think there is active bleeding; can be performed when cleared for procedure by cardiology team  #2 Daily CBC #3 One daily PPI for now  #4 I discussed cirrhosis and Hep C with him today at length.  I made him aware that his liver disease is advanced and that I recommend no more ETOH.  I have also asked that he consider inpatient or outpatient ETOH rehab. We discussed that Hep C treatment would only be possible if he was completely alcohol abstinent.  #5 Will check Hep B core total and Surface Ab (will assess for exposure to this virus, if neg surf ab, would vaccination)   #6 cover for EtOH withdrawal #7 repeat ECHO per cardiology #8 Further eval of bone lesion planned by primary team    Principal Problem:   Chest pain Active Problems:   Pericardial effusion   Acute renal failure   Alcohol abuse   Lytic lesion of bone on x-ray   Coffee ground emesis   Liver cirrhosis, alcoholic   UTI (urinary tract infection)     LOS: 2 days   Umberto Pavek M  02/09/2013, 10:28 AM

## 2013-02-09 NOTE — Progress Notes (Signed)
**Note De-Identified Poucher Obfuscation** The patient was seen and examined, and I agree with the assessment and plan as documented above, with modifications as noted below. Pt with moderate size pericardial effusion with a a very mild degree of right atrial free wall impingement and dilated IVC (indicative of elevated central venous pressures), but no frank collapse of either the right atrium or ventricle (no evidence of tamponade). Would recommend a repeat limited echo to assess effusion in 3 days.  He currently denies chest pain and shortness of breath, and remains hemodynamically stable (HR 80's). Workup as to the etiology is ongoing.

## 2013-02-10 ENCOUNTER — Inpatient Hospital Stay (HOSPITAL_COMMUNITY): Payer: BC Managed Care – PPO

## 2013-02-10 ENCOUNTER — Encounter (HOSPITAL_COMMUNITY): Payer: Self-pay | Admitting: Internal Medicine

## 2013-02-10 DIAGNOSIS — B1920 Unspecified viral hepatitis C without hepatic coma: Secondary | ICD-10-CM

## 2013-02-10 DIAGNOSIS — R188 Other ascites: Secondary | ICD-10-CM

## 2013-02-10 HISTORY — DX: Unspecified viral hepatitis C without hepatic coma: B19.20

## 2013-02-10 LAB — UIFE/LIGHT CHAINS/TP QN, 24-HR UR
Albumin, U: DETECTED
Alpha 1, Urine: DETECTED — AB
Alpha 2, Urine: DETECTED — AB
Beta, Urine: DETECTED — AB
Free Kappa Lt Chains,Ur: 9.99 mg/dL — ABNORMAL HIGH (ref 0.14–2.42)
Free Lambda Lt Chains,Ur: 1.56 mg/dL — ABNORMAL HIGH (ref 0.02–0.67)

## 2013-02-10 LAB — CBC
HCT: 37.2 % — ABNORMAL LOW (ref 39.0–52.0)
Hemoglobin: 12.8 g/dL — ABNORMAL LOW (ref 13.0–17.0)
MCH: 33.8 pg (ref 26.0–34.0)
MCV: 98.2 fL (ref 78.0–100.0)
Platelets: 151 10*3/uL (ref 150–400)
RDW: 12.7 % (ref 11.5–15.5)
WBC: 9.9 10*3/uL (ref 4.0–10.5)

## 2013-02-10 LAB — COMPREHENSIVE METABOLIC PANEL
AST: 100 U/L — ABNORMAL HIGH (ref 0–37)
Albumin: 2.5 g/dL — ABNORMAL LOW (ref 3.5–5.2)
Alkaline Phosphatase: 104 U/L (ref 39–117)
BUN: 20 mg/dL (ref 6–23)
Calcium: 8.1 mg/dL — ABNORMAL LOW (ref 8.4–10.5)
Chloride: 99 mEq/L (ref 96–112)
Potassium: 3.2 mEq/L — ABNORMAL LOW (ref 3.5–5.1)
Total Bilirubin: 0.4 mg/dL (ref 0.3–1.2)
Total Protein: 6.6 g/dL (ref 6.0–8.3)

## 2013-02-10 LAB — ANA: Anti Nuclear Antibody(ANA): POSITIVE — AB

## 2013-02-10 MED ORDER — PNEUMOCOCCAL VAC POLYVALENT 25 MCG/0.5ML IJ INJ: 0.5000 mL | INJECTION | INTRAMUSCULAR | Status: DC

## 2013-02-10 MED ORDER — INFLUENZA VAC SPLIT QUAD 0.5 ML IM SUSP: 0.5000 mL | INTRAMUSCULAR | Status: DC

## 2013-02-10 MED ORDER — TECHNETIUM TC 99M MEDRONATE IV KIT
25.0000 | PACK | Freq: Once | INTRAVENOUS | Status: AC | PRN
  Administered 2013-02-10: 25 via INTRAVENOUS

## 2013-02-10 MED ORDER — NICOTINE 21 MG/24HR TD PT24
21.0000 mg | MEDICATED_PATCH | Freq: Every day | TRANSDERMAL | Status: DC
  Administered 2013-02-10 – 2013-02-13 (×4): 21 mg via TRANSDERMAL
  Filled 2013-02-10 (×4): qty 1

## 2013-02-10 NOTE — Progress Notes (Signed)
**Note De-Identified Asquith Obfuscation** Patient ID: Marc Wilson  male  ION:[id number]    DOB: 10/15/1955    DOA: 02/07/2013  PCP: No PCP Per Patient  Assessment/Plan: Principal Problem:   Chest pain Active Problems:   Pericardial effusion   Acute renal failure   Alcohol abuse   Lytic lesion of bone on x-ray   Coffee ground emesis   Liver cirrhosis, alcoholic   UTI (urinary tract infection)   Ascites   Chest pain : likely related to vomiting rather than a cardiac cause, resolved - 2-D echo shows LV function normal, EF 55-60%, grade 1 diastolic dysfunction, no gross regional abnormalities, pericardial effusion no evidence of tamponade. Repeat echo tomorrow  Pericardial effusion: No evidence of tamponade or tear on the 2-D echo on 02/08/2013 - Limited 2-D echo ordered tomorrow -Cardiology following  - f/u ANA, quantiferon and Rheum factor  - ESR mildly elevated   Coffee-ground emesis: History of chronic alcoholism and hep C - GI following, possible EGD tomorrow - Continue PPI, tolerating diet  Acute renal failure : Improving  - on gentle hydration - renal ultrasound is not remarkable   UTI  - cont Rocephin  - culture negative but will complete a course of antibiotics for 7 days   Alcohol abuse  - follow for withdrawal , counseled for alcohol cessation   Lytic lesion of bone on x-ray  - check urine and plasma electrophoresis for myeloma  - bone scan ordered   Liver cirrhosis, alcoholic and Hep C with Ascites (perhepatic and perisplenic)  - Gi work up in process   DVT Prophylaxis:  Code Status:  Disposition: not medically ready     Subjective: Denies any specific complaints   Objective: Weight change:   Intake/Output Summary (Last 24 hours) at 02/10/13 1414 Last data filed at 02/10/13 1009  Gross per 24 hour  Intake    320 ml  Output   1250 ml  Net   -930 ml   Blood pressure 119/73, pulse 79, temperature 98.3 F (36.8 C), temperature source Oral, resp. rate 19, height 5\' 9"  (1.753 m), weight  83 kg (182 lb 15.7 oz), SpO2 93.00%.  Physical Exam: General: Alert and awake, oriented x3, not in any acute distress. CVS: S1-S2 clear, no murmur rubs or gallops Chest: clear to auscultation bilaterally, no wheezing, rales or rhonchi Abdomen: soft nontender, nondistended, normal bowel sounds  Extremities: no cyanosis, clubbing or edema noted bilaterally Neuro: Cranial nerves II-XII intact, no focal neurological deficits  Lab Results: Basic Metabolic Panel:  Recent Labs Lab 02/08/13 0435 02/09/13 0420 02/10/13 0545  NA 130* 134* 134*  K 4.2 3.5 3.2*  CL 93* 98 99  CO2 23 23 24   GLUCOSE 91 80 100*  BUN 34* 28* 20  CREATININE 2.32* 1.55* 1.28  CALCIUM 8.3* 7.9* 8.1*  MG 2.0  --   --   PHOS 4.8*  --   --    Liver Function Tests:  Recent Labs Lab 02/09/13 0420 02/10/13 0545  AST 182* 100*  ALT 141* 113*  ALKPHOS 111 104  BILITOT 0.4 0.4  PROT 6.8 6.6  ALBUMIN 2.6* 2.5*    Recent Labs Lab 02/07/13 1051  LIPASE 15    Recent Labs Lab 02/07/13 1458  AMMONIA 34   CBC:  Recent Labs Lab 02/07/13 1051  02/09/13 0420 02/10/13 0545  WBC 24.2*  < > 11.9* 9.9  NEUTROABS 21.8*  --   --   --   HGB 15.6  < > 13.0 12.8*  HCT **Note De-Identified Cosper Obfuscation** 46.4  < > 38.2* 37.2*  MCV 100.7*  < > 99.2 98.2  PLT 229  < > 146* 151  < > = values in this interval not displayed. Cardiac Enzymes:  Recent Labs Lab 02/07/13 2101 02/08/13 0435 02/08/13 1145  TROPONINI <0.30 <0.30 <0.30   BNP: No components found with this basename: POCBNP,  CBG: No results found for this basename: GLUCAP,  in the last 168 hours   Micro Results: Recent Results (from the past 240 hour(s))  URINE CULTURE     Status: None   Collection Time    02/07/13  5:30 PM      Result Value Range Status   Specimen Description URINE, CLEAN CATCH   Final   Special Requests NONE   Final   Culture  Setup Time     Final   Value: 02/08/2013 01:35     Performed at [facility] Count     Final   Value: NO  GROWTH     Performed at [facility]   Culture     Final   Value: NO GROWTH     Performed at [facility]   Report Status 02/09/2013 FINAL   Final    Studies/Results: Ct Abdomen Pelvis Wo Contrast  02/07/2013   CLINICAL DATA:  Epigastric pain  EXAM: CT ABDOMEN AND PELVIS WITHOUT CONTRAST  TECHNIQUE: Multidetector CT imaging of the abdomen and pelvis was performed following the standard protocol without intravenous contrast.  COMPARISON:  None.  FINDINGS: Study is limited without IV contrast. Bilateral small pleural effusion left greater than right with bilateral lower lobe posterior atelectasis. There is mild thickening of distal esophageal wall. Gastroesophageal reflux cannot be excluded.  There is moderate pericardial effusion measures up to 2 cm in thickness anteriorly. There is some thickening of pericardial wall. Inflammatory changes cannot be excluded. Clinical correlation is necessary.  The liver shows a micronodular contour suspicious for cirrhosis. Small perihepatic and perisplenic ascites. There is significant thickening of gallbladder wall up to 8 mm. No pericholecystic fluid is noted. Further evaluation with gallbladder ultrasound is recommended.  Nonspecific bilateral mild perinephric stranding. No nephrolithiasis. No hydronephrosis or hydroureter. Small amount of stranding and fluid noted bilateral paracolic gutter. Moderate stool noted in right colon. There is no pericecal inflammation. Some ascites is noted in right lower quadrant. Normal appendix partially visualized in axial image 53. There is moderate pelvic ascites. No small bowel obstruction. Atherosclerotic calcifications of abdominal aorta and iliac arteries. No free abdominal air. Bilateral inguinal scrotal carinal small hernia containing fat without evidence of acute complication. There is thickening of urinary bladder wall. Chronic inflammation or mild cystitis cannot be excluded. Prostate gland measures 4 x 3.2  cm. No destructive bony lesions are noted within pelvis.  Sagittal images of the spine shows degenerative changes lumbar spine. There is partial bony fusion T12-L1 vertebral body. Mild compression deformity upper endplate of L1 of indeterminate age. Clinical correlation is necessary. There is subtle lytic lesion left aspect of L3 vertebral body measures about 1.7 cm. Best visualized on sagittal image 54. Degenerative changes are noted right anterior superior acetabulum.  IMPRESSION: 1. Bilateral small pleural effusion left greater than right with bilateral lower lobe posterior atelectasis. Moderate pericardial effusion measures up to 2 cm anteriorly. There is some thickening of pericardial wall. Inflammatory changes cannot be excluded. Clinical correlation is necessary. 2. Bilateral perihepatic and perisplenic ascites. Micro nodular liver contour suspicious for cirrhosis. 3. Thickening of gallbladder wall **Note De-Identified Feister Obfuscation** up to 8 mm. Further correlation with gallbladder ultrasound is recommended. No calcified gallstones are noted within gallbladder. 4. Small ascites noted bilateral paracolic gutters. Small ascites noted right lower quadrant of the abdomen. Normal appendix partially visualized. 5. Moderate pelvic ascites. No small bowel or colonic obstruction. 6. Mild thickening of urinary bladder wall. Cystitis or chronic inflammatory changes cannot be excluded. 7. Nonspecific lytic appearance of L3 vertebral body. Further correlation with bone scan is suggested to exclude metastatic disease.   Electronically Signed   By: Natasha Mead M.D.   On: 02/07/2013 14:14   Dg Chest 2 View  02/07/2013   CLINICAL DATA:  Chest pain  EXAM: CHEST  2 VIEW  COMPARISON:  None.  FINDINGS: Cardiac shadow is within normal limits. Bilateral small pleural effusions and atelectatic changes are noted. No other focal abnormality is seen.  IMPRESSION: Bibasilar changes as described.   Electronically Signed   By: Alcide Clever M.D.   On: 02/07/2013 13:58    Ct Chest Wo Contrast  02/07/2013   CLINICAL DATA:  Complaints of chest pain times 15 hr, and shortness of breath, and leg swelling  EXAM: CT CHEST WITHOUT CONTRAST  TECHNIQUE: Multidetector CT imaging of the chest was performed following the standard protocol without IV contrast.  COMPARISON:  Abdominal CT dated 02/07/2013  FINDINGS: The thoracic inlet is unremarkable. A moderate sized pericardial effusion is appreciated as described previously measuring up to 2 cm in thickness. There is no evidence of a thoracic aortic aneurysm. Very mild mural calcifications identified within the proximal descending aorta. Small bilateral pleural effusions are identified left-greater-than-right. The lung parenchyma demonstrates areas of mild ground-glass density within the dependent portions of the lung bases. A focal area of bullous change projects within the posterior lateral aspect of the superior segment right lower lobe. No further focal regions of consolidation or focal infiltrates are appreciated.  The visualized upper abdominal viscera as described on recent CT abdomen and pelvis.  IMPRESSION: 1. Moderate-sized pericardial effusion. 2. Small bilateral pleural effusions left greater than right. 3. Likely dependent atelectasis within the lung bases.   Electronically Signed   By: Salome Holmes M.D.   On: 02/07/2013 15:06   US Renal  02/08/2013   CLINICAL DATA:  Renal failure; elevated creatinine.  EXAM: RENAL/URINARY TRACT ULTRASOUND COMPLETE  COMPARISON:  CT of the abdomen and pelvis performed earlier today at 1:57 p.m.  FINDINGS: Right Kidney  Length: 12.4 cm. Mildly increased parenchymal echogenicity. No mass or hydronephrosis visualized.  Left Kidney  Length: 13.2 cm. Mildly increased parenchymal echogenicity. No mass or hydronephrosis visualized.  Bladder  Appears normal for degree of bladder distention.  Note is made of small volume abdominopelvic ascites. Small bilateral pleural effusions are seen, left  greater than right.  IMPRESSION: 1. Mildly increased renal parenchymal echogenicity likely reflects medical renal disease. No evidence of hydronephrosis. 2. Small volume abdominopelvic ascites again noted; small bilateral pleural effusions, left greater than right.   Electronically Signed   By: Roanna Raider M.D.   On: 02/08/2013 04:14    Medications: Scheduled Meds: . cefTRIAXone (ROCEPHIN) IVPB 1 gram/50 mL D5W  1 g Intravenous Q24H  . docusate sodium  100 mg Oral BID  . folic acid  1 mg Oral Daily  . nicotine  21 mg Transdermal Daily  . pantoprazole  40 mg Oral Daily  . sodium chloride  3 mL Intravenous Q12H  . sodium chloride  3 mL Intravenous Q12H  . thiamine  100 mg **Note De-Identified Dorian Obfuscation** Oral Daily      LOS: 3 days   Tameya Kuznia M.D. [facility] 02/10/2013, 2:14 PM Pager: [phone]  If 7PM-7AM, please contact night-coverage [email or web address] Password [email or web address]

## 2013-02-10 NOTE — Progress Notes (Signed)
**Note De-Identified Silverio Obfuscation** I visited with Marc Wilson today and our time together was very short but very interesting in the same vein. In my time with this patient he began to explain to me how his life has been a waste for the last 40 years because of his drug addictions and reckless living. The patient also mentioned to me that he wants to go back to work and support his family and start doing the right things again in his life. The mood of this patient seemed very reflective as he recalled to mind a few things he did wrong in his life and he was very remorseful for where he is currently in his life. I offered to pray with him but he declined and he told me he will not allow his current situation to deter him from the life he believes he still can live in the future.

## 2013-02-10 NOTE — Progress Notes (Signed)
**Note De-Identified Smith Obfuscation** Patient ID: Marc Wilson, male   DOB: 07/30/1955, 57 y.o.   MRN: [id number]    SUBJECTIVE: The patient is not having significant chest pain this morning.   Filed Vitals:   02/09/13 1200 02/09/13 2101 02/10/13 0000 02/10/13 0400  BP:  124/80 120/72 115/71  Pulse:  102 101 83  Temp: 98.8 F (37.1 C) 99.4 F (37.4 C) 98.1 F (36.7 C) 98.8 F (37.1 C)  TempSrc: Oral Oral Oral Oral  Resp:  20 20 20   Height:      Weight:      SpO2:  96% 95% 95%     Intake/Output Summary (Last 24 hours) at 02/10/13 0820 Last data filed at 02/10/13 0528  Gross per 24 hour  Intake      3 ml  Output    250 ml  Net   -247 ml    LABS: Basic Metabolic Panel:  Recent Labs  40/98/11 1051 02/08/13 0435 02/09/13 0420 02/10/13 0545  NA 133* 130* 134* 134*  K 4.4 4.2 3.5 3.2*  CL 91* 93* 98 99  CO2 21 23 23 24   GLUCOSE 196* 91 80 100*  BUN 19 34* 28* 20  CREATININE 1.95* 2.32* 1.55* 1.28  CALCIUM 9.1 8.3* 7.9* 8.1*  MG  --  2.0  --   --   PHOS  --  4.8*  --   --    Liver Function Tests:  Recent Labs  02/09/13 0420 02/10/13 0545  AST 182* 100*  ALT 141* 113*  ALKPHOS 111 104  BILITOT 0.4 0.4  PROT 6.8 6.6  ALBUMIN 2.6* 2.5*    Recent Labs  02/07/13 1051  LIPASE 15   CBC:  Recent Labs  02/07/13 1051  02/09/13 0420 02/10/13 0545  WBC 24.2*  < > 11.9* 9.9  NEUTROABS 21.8*  --   --   --   HGB 15.6  < > 13.0 12.8*  HCT 46.4  < > 38.2* 37.2*  MCV 100.7*  < > 99.2 98.2  PLT 229  < > 146* 151  < > = values in this interval not displayed. Cardiac Enzymes:  Recent Labs  02/07/13 2101 02/08/13 0435 02/08/13 1145  TROPONINI <0.30 <0.30 <0.30   BNP: No components found with this basename: POCBNP,  D-Dimer: No results found for this basename: DDIMER,  in the last 72 hours Hemoglobin A1C: No results found for this basename: HGBA1C,  in the last 72 hours Fasting Lipid Panel: No results found for this basename: CHOL, HDL, LDLCALC, TRIG, CHOLHDL, LDLDIRECT,  in the last 72  hours Thyroid Function Tests:  Recent Labs  02/08/13 0435  TSH 2.493    RADIOLOGY: Ct Abdomen Pelvis Wo Contrast  02/07/2013   CLINICAL DATA:  Epigastric pain  EXAM: CT ABDOMEN AND PELVIS WITHOUT CONTRAST  TECHNIQUE: Multidetector CT imaging of the abdomen and pelvis was performed following the standard protocol without intravenous contrast.  COMPARISON:  None.  FINDINGS: Study is limited without IV contrast. Bilateral small pleural effusion left greater than right with bilateral lower lobe posterior atelectasis. There is mild thickening of distal esophageal wall. Gastroesophageal reflux cannot be excluded.  There is moderate pericardial effusion measures up to 2 cm in thickness anteriorly. There is some thickening of pericardial wall. Inflammatory changes cannot be excluded. Clinical correlation is necessary.  The liver shows a micronodular contour suspicious for cirrhosis. Small perihepatic and perisplenic ascites. There is significant thickening of gallbladder wall up to 8 mm. No pericholecystic fluid is noted. Further **Note De-Identified [place] Obfuscation** evaluation with gallbladder ultrasound is recommended.  Nonspecific bilateral mild perinephric stranding. No nephrolithiasis. No hydronephrosis or hydroureter. Small amount of stranding and fluid noted bilateral paracolic gutter. Moderate stool noted in right colon. There is no pericecal inflammation. Some ascites is noted in right lower quadrant. Normal appendix partially visualized in axial image 53. There is moderate pelvic ascites. No small bowel obstruction. Atherosclerotic calcifications of abdominal aorta and iliac arteries. No free abdominal air. Bilateral inguinal scrotal carinal small hernia containing fat without evidence of acute complication. There is thickening of urinary bladder wall. Chronic inflammation or mild cystitis cannot be excluded. Prostate gland measures 4 x 3.2 cm. No destructive bony lesions are noted within pelvis.  Sagittal images of the spine shows  degenerative changes lumbar spine. There is partial bony fusion T12-L1 vertebral body. Mild compression deformity upper endplate of L1 of indeterminate age. Clinical correlation is necessary. There is subtle lytic lesion left aspect of L3 vertebral body measures about 1.7 cm. Best visualized on sagittal image 54. Degenerative changes are noted right anterior superior acetabulum.  IMPRESSION: 1. Bilateral small pleural effusion left greater than right with bilateral lower lobe posterior atelectasis. Moderate pericardial effusion measures up to 2 cm anteriorly. There is some thickening of pericardial wall. Inflammatory changes cannot be excluded. Clinical correlation is necessary. 2. Bilateral perihepatic and perisplenic ascites. Micro nodular liver contour suspicious for cirrhosis. 3. Thickening of gallbladder wall up to 8 mm. Further correlation with gallbladder ultrasound is recommended. No calcified gallstones are noted within gallbladder. 4. Small ascites noted bilateral paracolic gutters. Small ascites noted right lower quadrant of the abdomen. Normal appendix partially visualized. 5. Moderate pelvic ascites. No small bowel or colonic obstruction. 6. Mild thickening of urinary bladder wall. Cystitis or chronic inflammatory changes cannot be excluded. 7. Nonspecific lytic appearance of L3 vertebral body. Further correlation with bone scan is suggested to exclude metastatic disease.   Electronically Signed   By: Natasha Mead M.D.   On: 02/07/2013 14:14   Dg Chest 2 View  02/07/2013   CLINICAL DATA:  Chest pain  EXAM: CHEST  2 VIEW  COMPARISON:  None.  FINDINGS: Cardiac shadow is within normal limits. Bilateral small pleural effusions and atelectatic changes are noted. No other focal abnormality is seen.  IMPRESSION: Bibasilar changes as described.   Electronically Signed   By: Alcide Clever M.D.   On: 02/07/2013 13:58   Ct Chest Wo Contrast  02/07/2013   CLINICAL DATA:  Complaints of chest pain times 15 hr, and  shortness of breath, and leg swelling  EXAM: CT CHEST WITHOUT CONTRAST  TECHNIQUE: Multidetector CT imaging of the chest was performed following the standard protocol without IV contrast.  COMPARISON:  Abdominal CT dated 02/07/2013  FINDINGS: The thoracic inlet is unremarkable. A moderate sized pericardial effusion is appreciated as described previously measuring up to 2 cm in thickness. There is no evidence of a thoracic aortic aneurysm. Very mild mural calcifications identified within the proximal descending aorta. Small bilateral pleural effusions are identified left-greater-than-right. The lung parenchyma demonstrates areas of mild ground-glass density within the dependent portions of the lung bases. A focal area of bullous change projects within the posterior lateral aspect of the superior segment right lower lobe. No further focal regions of consolidation or focal infiltrates are appreciated.  The visualized upper abdominal viscera as described on recent CT abdomen and pelvis.  IMPRESSION: 1. Moderate-sized pericardial effusion. 2. Small bilateral pleural effusions left greater than right. 3. Likely dependent atelectasis within the **Note De-Identified [place] Obfuscation** lung bases.   Electronically Signed   By: Salome Holmes M.D.   On: 02/07/2013 15:06   US Renal  02/08/2013   CLINICAL DATA:  Renal failure; elevated creatinine.  EXAM: RENAL/URINARY TRACT ULTRASOUND COMPLETE  COMPARISON:  CT of the abdomen and pelvis performed earlier today at 1:57 p.m.  FINDINGS: Right Kidney  Length: 12.4 cm. Mildly increased parenchymal echogenicity. No mass or hydronephrosis visualized.  Left Kidney  Length: 13.2 cm. Mildly increased parenchymal echogenicity. No mass or hydronephrosis visualized.  Bladder  Appears normal for degree of bladder distention.  Note is made of small volume abdominopelvic ascites. Small bilateral pleural effusions are seen, left greater than right.  IMPRESSION: 1. Mildly increased renal parenchymal echogenicity likely reflects  medical renal disease. No evidence of hydronephrosis. 2. Small volume abdominopelvic ascites again noted; small bilateral pleural effusions, left greater than right.   Electronically Signed   By: Roanna Raider M.D.   On: 02/08/2013 04:14    PHYSICAL EXAM  the patient is oriented to person time and place. Affect is normal. He does have elevated neck veins compatible with his increased central venous pressure. Lungs reveal scattered rhonchi. Cardiac exam reveals S1 and S2. There no clicks or significant murmurs. The abdomen is soft. There is no peripheral edema.   TELEMETRY: I have reviewed telemetry today February 10, 2013. There is normal sinus rhythm.  ASSESSMENT AND PLAN:    Chest pain    It is thought that his chest pain was related to his initial vomiting. LV function appears to be normal. The echo is technically limited but the ejection fraction is thought to be normal.    Pericardial effusion     There is a significant pericardial effusion. However there was no evidence of tear but not. It will be time to do his followup limited echo to reassess his effusion tomorrow.    Acute renal failure     Renal function continues to improve.    Alcohol abuse     I spoke with him about the importance of discontinuing his alcohol.    Lytic lesion of bone on x-ray    Coffee ground emesis    Liver cirrhosis, alcoholic   UTI (urinary tract infection)   Ascites   Willa Rough 02/10/2013 8:20 AM

## 2013-02-10 NOTE — Progress Notes (Signed)
**Note De-Identified [place] Obfuscation**  Gi Daily Rounding Note 02/10/2013, 8:47 AM  LOS: 3 days   SUBJECTIVE:       Brown stool.  No abdominal or chest pain.  Tolerating solid diet.  Feels better overall.  OBJECTIVE:         Vital signs in last 24 hours:    Temp:  [98.1 F (36.7 C)-99.4 F (37.4 C)] 98.3 F (36.8 C) (11/10 0700) Pulse Rate:  [79-102] 79 (11/10 0700) Resp:  [19-20] 19 (11/10 0700) BP: (115-124)/(71-80) 119/73 mmHg (11/10 0700) SpO2:  [93 %-96 %] 93 % (11/10 0700) Last BM Date: 02/09/13 General: looks comfortable, not ill looking   Heart: RRR.  No MRG Chest: diminished BS in bases.  No dyspnea Abdomen: soft, NT, ND.  No mass, no bruits.  Not tender.   Extremities: no CCE Neuro/Psych:  Cooperative, fully alert, no confusion, relaxed.   Lab Results:  Recent Labs  02/08/13 2200 02/09/13 0420 02/10/13 0545  WBC 12.4* 11.9* 9.9  HGB 12.2* 13.0 12.8*  HCT 35.3* 38.2* 37.2*  PLT 147* 146* 151   BMET  Recent Labs  02/08/13 0435 02/09/13 0420 02/10/13 0545  NA 130* 134* 134*  K 4.2 3.5 3.2*  CL 93* 98 99  CO2 23 23 24   GLUCOSE 91 80 100*  BUN 34* 28* 20  CREATININE 2.32* 1.55* 1.28  CALCIUM 8.3* 7.9* 8.1*   LFT  Recent Labs  02/08/13 0435 02/09/13 0420 02/10/13 0545  PROT 7.1 6.8 6.6  ALBUMIN 2.9* 2.6* 2.5*  AST 277* 182* 100*  ALT 165* 141* 113*  ALKPHOS 92 111 104  BILITOT 0.6 0.4 0.4   PT/INR  Recent Labs  02/07/13 1457 02/10/13 0545  LABPROT 16.4* 15.4*  INR 1.36 1.25   Hepatitis Panel  Recent Labs  02/08/13 0330  HEPBSAG NEGATIVE  HCVAB Reactive*  HEPAIGM NON REACTIVE  HEPBIGM NON REACTIVE    Studies/Results: 02/07/2013  CT ABDOMEN AND PELVIS WITHOUT CONTRAST FINDINGS:  Study is limited without IV contrast. Bilateral small pleural  effusion left greater than right with bilateral lower lobe posterior  atelectasis. There is mild thickening of distal esophageal wall.  Gastroesophageal reflux cannot be excluded.  There is moderate pericardial  effusion measures up to 2 cm in  thickness anteriorly. There is some thickening of pericardial wall.  Inflammatory changes cannot be excluded. Clinical correlation is  necessary.  The liver shows a micronodular contour suspicious for cirrhosis.  Small perihepatic and perisplenic ascites. There is significant  thickening of gallbladder wall up to 8 mm. No pericholecystic fluid  is noted. Further evaluation with gallbladder ultrasound is  recommended.  Nonspecific bilateral mild perinephric stranding. No  nephrolithiasis. No hydronephrosis or hydroureter. Small amount of  stranding and fluid noted bilateral paracolic gutter. Moderate stool  noted in right colon. There is no pericecal inflammation. Some  ascites is noted in right lower quadrant. Normal appendix partially  visualized in axial image 53. There is moderate pelvic ascites. No  small bowel obstruction. Atherosclerotic calcifications of abdominal  aorta and iliac arteries. No free abdominal air. Bilateral inguinal  scrotal carinal small hernia containing fat without evidence of  acute complication. There is thickening of urinary bladder wall.  Chronic inflammation or mild cystitis cannot be excluded. Prostate  gland measures 4 x 3.2 cm. No destructive bony lesions are noted  within pelvis.  Sagittal images of the spine shows degenerative changes lumbar  spine. There is partial bony fusion T12-L1 vertebral body. Mild  compression deformity **Note De-Identified Baus Obfuscation** upper endplate of L1 of indeterminate age.  Clinical correlation is necessary. There is subtle lytic lesion left  aspect of L3 vertebral body measures about 1.7 cm. Best visualized  on sagittal image 54. Degenerative changes are noted right anterior  superior acetabulum.  IMPRESSION:  1. Bilateral small pleural effusion left greater than right with  bilateral lower lobe posterior atelectasis. Moderate pericardial  effusion measures up to 2 cm anteriorly. There is some thickening of   pericardial wall. Inflammatory changes cannot be excluded. Clinical  correlation is necessary.  2. Bilateral perihepatic and perisplenic ascites. Micro nodular  liver contour suspicious for cirrhosis.  3. Thickening of gallbladder wall up to 8 mm. Further correlation  with gallbladder ultrasound is recommended. No calcified gallstones  are noted within gallbladder.  4. Small ascites noted bilateral paracolic gutters. Small ascites  noted right lower quadrant of the abdomen. Normal appendix partially  visualized.  5. Moderate pelvic ascites. No small bowel or colonic obstruction.  6. Mild thickening of urinary bladder wall. Cystitis or chronic  inflammatory changes cannot be excluded.  7. Nonspecific lytic appearance of L3 vertebral body. Further  correlation with bone scan is suggested to exclude metastatic  disease.    ASSESMENT:   *  New dx cirrhosis, this admission.  Hx of chronic alcoholism and hep C (aware of the Hep C PTA.  Risk factors: incarceration, IV and snorted street drugs, non-professional tatoos) Hep B surface AB negative. Hep B core Ab negative. Hep C RNA pending. Perihepatic/perisplenic/paracolic gutter/pelvic/RLQ ascites on CT.  *  Minor, limited report of dark emesis PTA.   *  Pericardial effusion. For repeat ECHO, so far has not required pericardial window.  ANA is positive. No chest pain or SOB  *  Minor coagulopathy.   *  Lesion of L3 spine, ? Malignant. No prostate lesions on CT.   *  ARF:  Resolved.   *  Minor thrombocytopenia: resolved.    PLAN   *  EGD tomorrow?    Jennye Moccasin  02/10/2013, 8:47 AM Pager: [phone]   [facility] Attending  I have also seen and assessed the patient and agree with the above note.  I will wait until we know status and plans for pericardial effusion prior to endoscopy.  He appears to be naive to Hep B. Needs Hep A ab total also - ordered. Will have vaccination implications.  He should have influenza  vaccination and pneumococcal vaccinations if not already done - will check and order.  Will need further characterization of the L3 lesion also - radiology recommends bone scan.  Iva Boop, MD, [facility] [phone] (pager) 02/10/2013 6:33 PM

## 2013-02-11 DIAGNOSIS — I319 Disease of pericardium, unspecified: Secondary | ICD-10-CM

## 2013-02-11 LAB — PROTEIN ELECTROPHORESIS, SERUM
Alpha-1-Globulin: 7.8 % — ABNORMAL HIGH (ref 2.9–4.9)
Alpha-2-Globulin: 14.2 % — ABNORMAL HIGH (ref 7.1–11.8)
Beta 2: 5.9 % (ref 3.2–6.5)
Beta Globulin: 11.4 % — ABNORMAL HIGH (ref 4.7–7.2)
Gamma Globulin: 17.7 % (ref 11.1–18.8)
Total Protein ELP: 5.7 g/dL — ABNORMAL LOW (ref 6.0–8.3)

## 2013-02-11 LAB — HEPATITIS A ANTIBODY, TOTAL: Hep A Total Ab: NONREACTIVE

## 2013-02-11 MED ORDER — PREDNISONE 20 MG PO TABS
30.0000 mg | ORAL_TABLET | Freq: Every day | ORAL | Status: DC
  Filled 2013-02-11: qty 1

## 2013-02-11 MED ORDER — PREDNISONE 20 MG PO TABS
40.0000 mg | ORAL_TABLET | Freq: Every day | ORAL | Status: AC
  Administered 2013-02-11 – 2013-02-13 (×3): 40 mg via ORAL
  Filled 2013-02-11 (×3): qty 2

## 2013-02-11 MED ORDER — COLCHICINE 0.6 MG PO TABS
0.6000 mg | ORAL_TABLET | Freq: Two times a day (BID) | ORAL | Status: DC
  Administered 2013-02-11 – 2013-02-13 (×5): 0.6 mg via ORAL
  Filled 2013-02-11 (×6): qty 1

## 2013-02-11 NOTE — Progress Notes (Addendum)
**Note De-Identified [place] Obfuscation** Repeat echocardiogram results reviewed with MD. 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  Recommendations:  Prednisone taper, start at 40 mg x 3 days, 30 mg x 3 days, 20 mg x 3 days, 10 mg x 3 days and stop. Have ordered. Continue colchicine as well.  No NSAIDs with recent AKI.  Follow up in the office with repeat echo in 2-3 weeks - appointments made for 12/1. No further inpatient cardiac workup indicated at this time. OK to have EGD in am. OK for d/c from a cardiac standpoint.   [name], PA 02/11/13  Agree with the above note.  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  Will treat with colchicine and steroids (no NSAIDs) with followup echo and office visit in 2-3 weeks.  If still looks constrictive on followup echo, would plan R/LHC with possible pericardial stripping, especially if symptomatic.   [name] 02/11/2013 5:31 PM

## 2013-02-11 NOTE — Care Management Note (Unsigned)
**Note De-identified [place] Obfuscation**    **Note De-Identified [place] Obfuscation** Page 1 of 1   02/11/2013     11:51:50 AM   CARE MANAGEMENT NOTE 02/11/2013  Patient:  Marc Wilson,Marc Wilson   Account Number:  [id number]  Date Initiated:  02/11/2013  Documentation initiated by:  GRAVES-BIGELOW,Starkisha Tullis  Subjective/Objective Assessment:   Pt admitted for cp and Pericardial effusion plan for repeat echo today. Continues on IV rocephin for UTI. Pt had Coffee-ground emesis: History of chronic alcoholism and hep C  - GI following- possible EGD.     Action/Plan:   CM will continue to monitor for disposition needs.   Anticipated DC Date:  02/11/2013   Anticipated DC Plan:  HOME/SELF CARE      DC Planning Services  CM consult      Choice offered to / List presented to:             Status of service:  In process, will continue to follow Medicare Important Message given?   (If response is "NO", the following Medicare IM given date fields will be blank) Date Medicare IM given:   Date Additional Medicare IM given:    Discharge Disposition:    Per UR Regulation:  Reviewed for med. necessity/level of care/duration of stay  If discussed at Long Length of Stay Meetings, dates discussed:    Comments:

## 2013-02-11 NOTE — Progress Notes (Signed)
**Note De-Identified [place] Obfuscation** [facility] Daily Rounding Note 02/11/2013, 8:29 AM  LOS: 4 days   SUBJECTIVE:       No complaints.  Feels better.  Tolerating solids and eating well, stools brown.  No nausea. No chest pain and no SOB   OBJECTIVE:         Vital signs in last 24 hours:    Temp:  [98.1 F (36.7 C)-99 F (37.2 C)] 98.5 F (36.9 C) (11/11 0430) Pulse Rate:  [79-95] 80 (11/11 0430) Resp:  [18] 18 (11/11 0430) BP: (118-143)/(72-99) 127/78 mmHg (11/11 0430) SpO2:  [95 %-99 %] 96 % (11/11 0430) Last BM Date: 02/10/13 Filed Weights   02/07/13 1027 02/07/13 1900  Weight: 77.111 kg (170 lb) 83 kg (182 lb 15.7 oz)   General: looks a bit pale but comfortable and not ill looking   Heart: RRR with soft rub vs systolic murmer Chest: clear bil.  No labored breathing Abdomen: soft, NT, ND. Active BS  Extremities: No CCE Neuro/Psych:  Pleasant, no deficits, appropriate and relaxed.   Intake/Output from previous day: 11/10 0701 - 11/11 0700 In: 560 [P.O.:560] Out: 2950 [Urine:2950]  Intake/Output this shift:    Lab Results:  Recent Labs  02/08/13 2200 02/09/13 0420 02/10/13 0545  WBC 12.4* 11.9* 9.9  HGB 12.2* 13.0 12.8*  HCT 35.3* 38.2* 37.2*  PLT 147* 146* 151   BMET  Recent Labs  02/09/13 0420 02/10/13 0545  NA 134* 134*  K 3.5 3.2*  CL 98 99  CO2 23 24  GLUCOSE 80 100*  BUN 28* 20  CREATININE 1.55* 1.28  CALCIUM 7.9* 8.1*   LFT  Recent Labs  02/09/13 0420 02/10/13 0545  PROT 6.8 6.6  ALBUMIN 2.6* 2.5*  AST 182* 100*  ALT 141* 113*  ALKPHOS 111 104  BILITOT 0.4 0.4   PT/INR  Recent Labs  02/10/13 0545  LABPROT 15.4*  INR 1.25   Hepatitis Panel No results found for this basename: HEPBSAG, HCVAB, HEPAIGM, HEPBIGM,  in the last 72 hours  Studies/Results: Nm Bone Scan Whole Body 02/10/2013 FINDINGS: There is no abnormal radiotracer within the L3 vertebral body to correspond to the indeterminate lesion on comparison CT. No abnormal uptake within the axillary  or appendicular skeleton to suggest metastasis or trauma.  IMPRESSION: No evidence of skeletal metastasis. No abnormal uptake at L3.   Electronically Signed   By: [name] M.D.   On: 02/10/2013 15:25   2D Echo Study Conclusions - Left ventricle: The cavity size was normal. Wall thickness was increased in a pattern of mild LVH. Systolic function was normal. The estimated ejection fraction was in the range of 55% to 60%. Wall motion was normal; there were no regional wall motion abnormalities. Doppler parameters are consistent with abnormal left ventricular relaxation (grade 1 diastolic dysfunction). - Left atrium: The atrium was mildly dilated. - Right atrium: The atrium was mildly dilated. - Pericardium, extracardiac: A small pericardial effusionwas identified. The appearance is consistent with aneffusive-constrictive pathology. 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 Compared to study of 02/08/13, effusion is smaller with more prominent organization.   ASSESMENT:  * New dx cirrhosis, this admission, noted on CT scan. Hx of chronic alcoholism and hep C (aware of the Hep C PTA). Risk factors: incarceration, IV and snorted street drugs, non-professional tatoos)  LFTs improved.  Hep B surface AB **Note De-Identified [place] Obfuscation** negative. Hep B core Ab negative.  Hep C RNA, Hep A Ab, pending.  Perihepatic/perisplenic/paracolic gutter/pelvic/RLQ ascites on CT.  * Minor, limited report of dark emesis PTA. No recurrence. Thickened distal esophagus on CT abdomen. On once daily PO Protonix.  * Pericardial effusion. 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  Await plans re:? pericardial window. ANA is positive. No chest pain or SOB.  * Minor coagulopathy.  * Lytic spinal lesion L3.  Unremarkable bone scan.  No  prostate lesions on CT.  * Hyponatremia, hypokalemia.  *  ? UTI.  U/A + but concomitant culture from same specimen (pre-Rocephin) has no growth. He continues on Rocephin    PLAN   *  Await cardiology plans before setting pt up for EGD.   Addendum:  4:45 PM:  Plan medical rx for pericarditis per cards.   EGD set for 12:445 PM tomorrow.     Jennye Moccasin  02/11/2013, 8:29 AM Pager: [phone]  [facility] Attending  I have also seen and assessed the patient and agree with the above note. EGD tomorrow then should be able to go home Will need outpt GI F/U EGD will need to be later than 1245 - I spoke to him  Iva Boop, MD, [facility] [phone] (pager) 02/11/2013 6:30 PM

## 2013-02-11 NOTE — Progress Notes (Signed)
**Note De-identified [place] Obfuscation**  **Note De-Identified Goza Obfuscation** Echocardiogram 2D Echocardiogram has been performed.  Keiji Melland 02/11/2013, 10:29 AM

## 2013-02-11 NOTE — Progress Notes (Signed)
**Note De-Identified [place] Obfuscation** Patient ID: Marc Wilson  male  JYN:[id number]    DOB: 1955-12-01    DOA: 02/07/2013  PCP: No PCP Per Patient  Assessment/Plan: Principal Problem:   Chest pain Active Problems:   Pericardial effusion   Acute renal failure   Alcohol abuse   Lytic lesion of bone on x-ray   Coffee ground emesis   Liver cirrhosis, alcoholic   UTI (urinary tract infection)   Ascites   Chest pain : likely related to vomiting rather than a cardiac cause, resolved - 2-D echo shows LV function normal, EF 55-60%, grade 1 diastolic dysfunction, no gross regional abnormalities, pericardial effusion no evidence of tamponade.  - Repeat echo today  Pericardial effusion: No evidence of tamponade or tear on the 2-D echo on 02/08/2013 - 2D Echo today, Cardiology following  - f/u ANA, quantiferon and Rheum factor  - ESR mildly elevated   Coffee-ground emesis: History of chronic alcoholism and hep C - GI following, possible EGD, per GI after Echo  - Continue PPI, tolerating diet  Acute renal failure : Improving  - on gentle hydration - renal ultrasound is not remarkable   UTI  - cont Rocephin  - culture negative but will complete a course of antibiotics for 7 days   Alcohol abuse  - follow for withdrawal , counseled for alcohol cessation   Lytic lesion of bone on x-ray  - check urine and plasma electrophoresis for myeloma  - bone scan negative for skeletal metastasis, no abnormal uptake at L3  Liver cirrhosis, alcoholic and Hep C with Ascites (perhepatic and perisplenic)  - Gi work up in process   DVT Prophylaxis:  Code Status:  Disposition: not medically ready,      Subjective: Denies any specific complaints awaiting echo at time of my encounter,    Objective: Weight change:   Intake/Output Summary (Last 24 hours) at 02/11/13 1128 Last data filed at 02/11/13 0900  Gross per 24 hour  Intake    880 ml  Output   2750 ml  Net  -1870 ml   Blood pressure 123/76, pulse 82, temperature 98.6 F  (37 C), temperature source Oral, resp. rate 18, height 5\' 9"  (1.753 m), weight 83 kg (182 lb 15.7 oz), SpO2 96.00%.  Physical Exam: General: A x O x3, NAD. CVS: S1-S2 clear, no mrg Chest: CTAB Abdomen: soft nontender, nondistended, normal bowel sounds  Extremities: no c/c/e bilaterally   Lab Results: Basic Metabolic Panel:  Recent Labs Lab 02/08/13 0435 02/09/13 0420 02/10/13 0545  NA 130* 134* 134*  K 4.2 3.5 3.2*  CL 93* 98 99  CO2 23 23 24   GLUCOSE 91 80 100*  BUN 34* 28* 20  CREATININE 2.32* 1.55* 1.28  CALCIUM 8.3* 7.9* 8.1*  MG 2.0  --   --   PHOS 4.8*  --   --    Liver Function Tests:  Recent Labs Lab 02/09/13 0420 02/10/13 0545  AST 182* 100*  ALT 141* 113*  ALKPHOS 111 104  BILITOT 0.4 0.4  PROT 6.8 6.6  ALBUMIN 2.6* 2.5*    Recent Labs Lab 02/07/13 1051  LIPASE 15    Recent Labs Lab 02/07/13 1458  AMMONIA 34   CBC:  Recent Labs Lab 02/07/13 1051  02/09/13 0420 02/10/13 0545  WBC 24.2*  < > 11.9* 9.9  NEUTROABS 21.8*  --   --   --   HGB 15.6  < > 13.0 12.8*  HCT 46.4  < > 38.2* 37.2*  MCV 100.7*  < > **Note De-Identified [place] Obfuscation** 99.2 98.2  PLT 229  < > 146* 151  < > = values in this interval not displayed. Cardiac Enzymes:  Recent Labs Lab 02/07/13 2101 02/08/13 0435 02/08/13 1145  TROPONINI <0.30 <0.30 <0.30   BNP: No components found with this basename: POCBNP,  CBG: No results found for this basename: GLUCAP,  in the last 168 hours   Micro Results: Recent Results (from the past 240 hour(s))  URINE CULTURE     Status: None   Collection Time    02/07/13  5:30 PM      Result Value Range Status   Specimen Description URINE, CLEAN CATCH   Final   Special Requests NONE   Final   Culture  Setup Time     Final   Value: 02/08/2013 01:35     Performed at [facility] Count     Final   Value: NO GROWTH     Performed at [facility]   Culture     Final   Value: NO GROWTH     Performed at [facility]   Report  Status 02/09/2013 FINAL   Final    Studies/Results: Ct Abdomen Pelvis Wo Contrast  02/07/2013   CLINICAL DATA:  Epigastric pain  EXAM: CT ABDOMEN AND PELVIS WITHOUT CONTRAST  TECHNIQUE: Multidetector CT imaging of the abdomen and pelvis was performed following the standard protocol without intravenous contrast.  COMPARISON:  None.  FINDINGS: Study is limited without IV contrast. Bilateral small pleural effusion left greater than right with bilateral lower lobe posterior atelectasis. There is mild thickening of distal esophageal wall. Gastroesophageal reflux cannot be excluded.  There is moderate pericardial effusion measures up to 2 cm in thickness anteriorly. There is some thickening of pericardial wall. Inflammatory changes cannot be excluded. Clinical correlation is necessary.  The liver shows a micronodular contour suspicious for cirrhosis. Small perihepatic and perisplenic ascites. There is significant thickening of gallbladder wall up to 8 mm. No pericholecystic fluid is noted. Further evaluation with gallbladder ultrasound is recommended.  Nonspecific bilateral mild perinephric stranding. No nephrolithiasis. No hydronephrosis or hydroureter. Small amount of stranding and fluid noted bilateral paracolic gutter. Moderate stool noted in right colon. There is no pericecal inflammation. Some ascites is noted in right lower quadrant. Normal appendix partially visualized in axial image 53. There is moderate pelvic ascites. No small bowel obstruction. Atherosclerotic calcifications of abdominal aorta and iliac arteries. No free abdominal air. Bilateral inguinal scrotal carinal small hernia containing fat without evidence of acute complication. There is thickening of urinary bladder wall. Chronic inflammation or mild cystitis cannot be excluded. Prostate gland measures 4 x 3.2 cm. No destructive bony lesions are noted within pelvis.  Sagittal images of the spine shows degenerative changes lumbar spine. There is  partial bony fusion T12-L1 vertebral body. Mild compression deformity upper endplate of L1 of indeterminate age. Clinical correlation is necessary. There is subtle lytic lesion left aspect of L3 vertebral body measures about 1.7 cm. Best visualized on sagittal image 54. Degenerative changes are noted right anterior superior acetabulum.  IMPRESSION: 1. Bilateral small pleural effusion left greater than right with bilateral lower lobe posterior atelectasis. Moderate pericardial effusion measures up to 2 cm anteriorly. There is some thickening of pericardial wall. Inflammatory changes cannot be excluded. Clinical correlation is necessary. 2. Bilateral perihepatic and perisplenic ascites. Micro nodular liver contour suspicious for cirrhosis. 3. Thickening of gallbladder wall up to 8 mm. Further correlation with gallbladder ultrasound is recommended. No **Note De-Identified [place] Obfuscation** calcified gallstones are noted within gallbladder. 4. Small ascites noted bilateral paracolic gutters. Small ascites noted right lower quadrant of the abdomen. Normal appendix partially visualized. 5. Moderate pelvic ascites. No small bowel or colonic obstruction. 6. Mild thickening of urinary bladder wall. Cystitis or chronic inflammatory changes cannot be excluded. 7. Nonspecific lytic appearance of L3 vertebral body. Further correlation with bone scan is suggested to exclude metastatic disease.   Electronically Signed   By: Natasha Mead M.D.   On: 02/07/2013 14:14   Dg Chest 2 View  02/07/2013   CLINICAL DATA:  Chest pain  EXAM: CHEST  2 VIEW  COMPARISON:  None.  FINDINGS: Cardiac shadow is within normal limits. Bilateral small pleural effusions and atelectatic changes are noted. No other focal abnormality is seen.  IMPRESSION: Bibasilar changes as described.   Electronically Signed   By: Alcide Clever M.D.   On: 02/07/2013 13:58   Ct Chest Wo Contrast  02/07/2013   CLINICAL DATA:  Complaints of chest pain times 15 hr, and shortness of breath, and leg swelling   EXAM: CT CHEST WITHOUT CONTRAST  TECHNIQUE: Multidetector CT imaging of the chest was performed following the standard protocol without IV contrast.  COMPARISON:  Abdominal CT dated 02/07/2013  FINDINGS: The thoracic inlet is unremarkable. A moderate sized pericardial effusion is appreciated as described previously measuring up to 2 cm in thickness. There is no evidence of a thoracic aortic aneurysm. Very mild mural calcifications identified within the proximal descending aorta. Small bilateral pleural effusions are identified left-greater-than-right. The lung parenchyma demonstrates areas of mild ground-glass density within the dependent portions of the lung bases. A focal area of bullous change projects within the posterior lateral aspect of the superior segment right lower lobe. No further focal regions of consolidation or focal infiltrates are appreciated.  The visualized upper abdominal viscera as described on recent CT abdomen and pelvis.  IMPRESSION: 1. Moderate-sized pericardial effusion. 2. Small bilateral pleural effusions left greater than right. 3. Likely dependent atelectasis within the lung bases.   Electronically Signed   By: Salome Holmes M.D.   On: 02/07/2013 15:06   US Renal  02/08/2013   CLINICAL DATA:  Renal failure; elevated creatinine.  EXAM: RENAL/URINARY TRACT ULTRASOUND COMPLETE  COMPARISON:  CT of the abdomen and pelvis performed earlier today at 1:57 p.m.  FINDINGS: Right Kidney  Length: 12.4 cm. Mildly increased parenchymal echogenicity. No mass or hydronephrosis visualized.  Left Kidney  Length: 13.2 cm. Mildly increased parenchymal echogenicity. No mass or hydronephrosis visualized.  Bladder  Appears normal for degree of bladder distention.  Note is made of small volume abdominopelvic ascites. Small bilateral pleural effusions are seen, left greater than right.  IMPRESSION: 1. Mildly increased renal parenchymal echogenicity likely reflects medical renal disease. No evidence of  hydronephrosis. 2. Small volume abdominopelvic ascites again noted; small bilateral pleural effusions, left greater than right.   Electronically Signed   By: Roanna Raider M.D.   On: 02/08/2013 04:14    Medications: Scheduled Meds: . cefTRIAXone (ROCEPHIN) IVPB 1 gram/50 mL D5W  1 g Intravenous Q24H  . colchicine  0.6 mg Oral BID  . docusate sodium  100 mg Oral BID  . folic acid  1 mg Oral Daily  . nicotine  21 mg Transdermal Daily  . pantoprazole  40 mg Oral Daily  . sodium chloride  3 mL Intravenous Q12H  . sodium chloride  3 mL Intravenous Q12H  . thiamine  100 mg Oral Daily **Note De-Identified [place] Obfuscation** LOS: 4 days   RAI,RIPUDEEP M.D. [facility] 02/11/2013, 11:28 AM Pager: [phone]  If 7PM-7AM, please contact night-coverage [email or web address] Password [email or web address]

## 2013-02-11 NOTE — Progress Notes (Signed)
**Note De-Identified [place] Obfuscation** Patient Name: Marc Wilson Date of Encounter: 02/11/2013  Principal Problem:   Chest pain Active Problems:   Pericardial effusion   Acute renal failure   Alcohol abuse   Lytic lesion of bone on x-ray   Coffee ground emesis   Liver cirrhosis, alcoholic   UTI (urinary tract infection)   Ascites   Hepatitis C    SUBJECTIVE: Breathing well, no chest pain. Wants to go home. No problems with ambulation.  OBJECTIVE Filed Vitals:   02/10/13 1415 02/10/13 2202 02/11/13 0033 02/11/13 0430  BP: 118/72 143/99 135/80 127/78  Pulse: 79 85 83 80  Temp: 98.1 F (36.7 C) 99 F (37.2 C) 98.4 F (36.9 C) 98.5 F (36.9 C)  TempSrc: Oral Oral Oral Oral  Resp: 18 18 18 18   Height:      Weight:      SpO2: 95% 99% 97% 96%    Intake/Output Summary (Last 24 hours) at 02/11/13 0819 Last data filed at 02/11/13 0517  Gross per 24 hour  Intake    560 ml  Output   2950 ml  Net  -2390 ml   Filed Weights   02/07/13 1027 02/07/13 1900  Weight: 170 lb (77.111 kg) 182 lb 15.7 oz (83 kg)    PHYSICAL EXAM General: Well developed, well nourished, male in no acute distress. Head: Normocephalic, atraumatic.  Neck: Supple without bruits, JVD 10 cm. Lungs:  Resp regular and unlabored, bibasilar rales, good air exchange. Heart: RRR, S1, S2, no S3, S4, loud friction rub noted. Abdomen: Soft, non-tender, non-distended, BS + x 4.  Extremities: No clubbing, cyanosis, no edema.  Neuro: Alert and oriented X 3. Moves all extremities spontaneously. Psych: Normal affect.  LABS: CBC: Recent Labs  02/09/13 0420 02/10/13 0545  WBC 11.9* 9.9  HGB 13.0 12.8*  HCT 38.2* 37.2*  MCV 99.2 98.2  PLT 146* 151   INR: Recent Labs  02/10/13 0545  INR 1.25   Basic Metabolic Panel: Recent Labs  02/09/13 0420 02/10/13 0545  NA 134* 134*  K 3.5 3.2*  CL 98 99  CO2 23 24  GLUCOSE 80 100*  BUN 28* 20  CREATININE 1.55* 1.28  CALCIUM 7.9* 8.1*   Liver Function Tests: Recent Labs   02/09/13 0420 02/10/13 0545  AST 182* 100*  ALT 141* 113*  ALKPHOS 111 104  BILITOT 0.4 0.4  PROT 6.8 6.6  ALBUMIN 2.6* 2.5*   Cardiac Enzymes: Recent Labs  02/08/13 1145  TROPONINI <0.30    TELE:    SR    Radiology/Studies: Nm Bone Scan Whole Body  02/10/2013   CLINICAL DATA:  Lytic lesion seen on CT  EXAM: NUCLEAR MEDICINE WHOLE BODY BONE SCAN  TECHNIQUE: Whole body anterior and posterior images were obtained approximately 3 hours after intravenous injection of radiopharmaceutical.  COMPARISON:  CT 02/07/2013  RADIOPHARMACEUTICALS:  Twenty-five mCi Technetium-99 MDP  FINDINGS: There is no abnormal radiotracer within the L3 vertebral body to correspond to the indeterminate lesion on comparison CT. No abnormal uptake within the axillary or appendicular skeleton to suggest metastasis or trauma.  IMPRESSION: No evidence of skeletal metastasis. No abnormal uptake at L3.   Electronically Signed   By: [name] M.D.   On: 02/10/2013 15:25     Current Medications:  . cefTRIAXone (ROCEPHIN) IVPB 1 gram/50 mL D5W  1 g Intravenous Q24H  . docusate sodium  100 mg Oral BID  . folic acid  1 mg Oral Daily  . nicotine **Note De-Identified [place] Obfuscation** 21 mg Transdermal Daily  . pantoprazole  40 mg Oral Daily  . sodium chloride  3 mL Intravenous Q12H  . sodium chloride  3 mL Intravenous Q12H  . thiamine  100 mg Oral Daily   . sodium chloride 50 mL/hr at 02/09/13 1952    ASSESSMENT AND PLAN: Principal Problem:   Chest pain Active Problems:   Pericardial effusion - f/u echo today.   Acute renal failure   Alcohol abuse   Lytic lesion of bone on x-ray   Coffee ground emesis   Liver cirrhosis, alcoholic   UTI (urinary tract infection)   Ascites   Hepatitis C   Signed, [name] , PA-C 8:19 AM 02/11/2013  Patient seen with PA, agree with the above note.  Patient has cirrhosis by imaging with only mild ascites.  Last echo showed mild to moderate pericardial effusion.  He has a prominent friction rub  on exam today though chest pain has resolved. Elevated JVP.  Would not expect such a loud friction rub with large pericardial effusion.  ? Development of effusive/constrictive pericarditis given elevated JVP.  Will get repeat echo today to assess for pericardial effusion size and for pericardial constriction physiology.  Would treat for acute pericarditis.  Avoid NSAIDs with h/o coffee grounds emesis and elevated creatinine.  Would use colchicine.    [name] 02/11/2013 8:28 AM

## 2013-02-12 ENCOUNTER — Inpatient Hospital Stay (HOSPITAL_COMMUNITY): Payer: BC Managed Care – PPO

## 2013-02-12 ENCOUNTER — Encounter (HOSPITAL_COMMUNITY)
Admission: EM | Disposition: A | Discharge status: Home / Self Care | Payer: Self-pay | Source: Home / Self Care | Attending: Internal Medicine

## 2013-02-12 ENCOUNTER — Encounter (HOSPITAL_COMMUNITY): Payer: Self-pay | Admitting: Internal Medicine

## 2013-02-12 DIAGNOSIS — I311 Chronic constrictive pericarditis: Secondary | ICD-10-CM

## 2013-02-12 HISTORY — PX: ESOPHAGOGASTRODUODENOSCOPY: SHX5428

## 2013-02-12 LAB — CBC
HCT: 37.4 % — ABNORMAL LOW (ref 39.0–52.0)
Hemoglobin: 12.9 g/dL — ABNORMAL LOW (ref 13.0–17.0)
MCH: 33.6 pg (ref 26.0–34.0)
Platelets: 179 10*3/uL (ref 150–400)
RBC: 3.84 MIL/uL — ABNORMAL LOW (ref 4.22–5.81)
WBC: 10.6 10*3/uL — ABNORMAL HIGH (ref 4.0–10.5)

## 2013-02-12 LAB — BASIC METABOLIC PANEL
BUN: 12 mg/dL (ref 6–23)
CO2: 23 mEq/L (ref 19–32)
Glucose, Bld: 154 mg/dL — ABNORMAL HIGH (ref 70–99)
Potassium: 4 mEq/L (ref 3.5–5.1)
Sodium: 134 mEq/L — ABNORMAL LOW (ref 135–145)

## 2013-02-12 LAB — COMPREHENSIVE METABOLIC PANEL
AST: 41 U/L — ABNORMAL HIGH (ref 0–37)
BUN: 12 mg/dL (ref 6–23)
CO2: 24 mEq/L (ref 19–32)
Calcium: 8.4 mg/dL (ref 8.4–10.5)
Creatinine, Ser: 1.05 mg/dL (ref 0.50–1.35)
GFR calc Af Amer: >90 mL/min (ref >90)
GFR calc non Af Amer: 79 mL/min — ABNORMAL LOW (ref >90)
Glucose, Bld: 112 mg/dL — ABNORMAL HIGH (ref 70–99)
Sodium: 136 mEq/L (ref 135–145)
Total Bilirubin: 0.4 mg/dL (ref 0.3–1.2)

## 2013-02-12 LAB — QUANTIFERON TB GOLD ASSAY (BLOOD)
Mitogen value: 0.32 IU/mL
Quantiferon Nil Value: 0.02 IU/mL

## 2013-02-12 LAB — HEPATITIS C VRS RNA DETECT BY PCR-QUAL: Hepatitis C Vrs RNA by PCR-Qual: POSITIVE — AB

## 2013-02-12 SURGERY — EGD (ESOPHAGOGASTRODUODENOSCOPY)
Anesthesia: Moderate Sedation

## 2013-02-12 MED ORDER — MIDAZOLAM HCL 10 MG/2ML IJ SOLN
INTRAMUSCULAR | Status: DC | PRN
  Administered 2013-02-12 (×3): 2 mg via INTRAVENOUS

## 2013-02-12 MED ORDER — FENTANYL CITRATE 0.05 MG/ML IJ SOLN
INTRAMUSCULAR | Status: AC
  Filled 2013-02-12: qty 2

## 2013-02-12 MED ORDER — FENTANYL CITRATE 0.05 MG/ML IJ SOLN
INTRAMUSCULAR | Status: DC | PRN
  Administered 2013-02-12 (×3): 25 ug via INTRAVENOUS

## 2013-02-12 MED ORDER — DIPHENHYDRAMINE HCL 50 MG/ML IJ SOLN
INTRAMUSCULAR | Status: AC
  Filled 2013-02-12: qty 1

## 2013-02-12 MED ORDER — BUTAMBEN-TETRACAINE-BENZOCAINE 2-2-14 % EX AERO
INHALATION_SPRAY | CUTANEOUS | Status: DC | PRN
  Administered 2013-02-12: 2 via TOPICAL

## 2013-02-12 MED ORDER — SODIUM CHLORIDE 0.9 % IV SOLN
INTRAVENOUS | Status: DC
  Administered 2013-02-12: 09:00:00 via INTRAVENOUS

## 2013-02-12 MED ORDER — GADOBENATE DIMEGLUMINE 529 MG/ML IV SOLN
30.0000 mL | Freq: Once | INTRAVENOUS | Status: AC
  Administered 2013-02-12: 30 mL via INTRAVENOUS

## 2013-02-12 MED ORDER — MIDAZOLAM HCL 5 MG/ML IJ SOLN
INTRAMUSCULAR | Status: AC
  Filled 2013-02-12: qty 2

## 2013-02-12 NOTE — Progress Notes (Addendum)
**Note De-Identified [place] Obfuscation** Patient ID: Marc Wilson, male   DOB: [birth year], 57 y.o.   MRN: [id number]   SUBJECTIVE: No complaints today, no chest pain.  Plans to go for EGD later today.   . cefTRIAXone (ROCEPHIN) IVPB 1 gram/50 mL D5W  1 g Intravenous Q24H  . colchicine  0.6 mg Oral BID  . docusate sodium  100 mg Oral BID  . folic acid  1 mg Oral Daily  . nicotine  21 mg Transdermal Daily  . pantoprazole  40 mg Oral Daily  . [START ON 02/14/2013] predniSONE  30 mg Oral Q breakfast  . predniSONE  40 mg Oral Q breakfast  . sodium chloride  3 mL Intravenous Q12H  . sodium chloride  3 mL Intravenous Q12H  . thiamine  100 mg Oral Daily     Filed Vitals:   02/11/13 1210 02/11/13 2130 02/12/13 0524 02/12/13 1345  BP: 122/77 136/87 134/92 145/98  Pulse: 81 85 87 85  Temp: 98.4 F (36.9 C) 98.1 F (36.7 C) 98.7 F (37.1 C) 98 F (36.7 C)  TempSrc: Oral Axillary Oral Oral  Resp: 18   16  Height:      Weight:      SpO2: 97% 94% 94% 96%    Intake/Output Summary (Last 24 hours) at 02/12/13 1353 Last data filed at 02/12/13 1100  Gross per 24 hour  Intake      0 ml  Output   1300 ml  Net  -1300 ml    LABS: Basic Metabolic Panel:  Recent Labs  11/91/47 0705 02/12/13 1048  NA 136 134*  K 4.0 4.0  CL 102 101  CO2 24 23  GLUCOSE 112* 154*  BUN 12 12  CREATININE 1.05 1.03  CALCIUM 8.4 8.4   Liver Function Tests:  Recent Labs  02/10/13 0545 02/12/13 0705  AST 100* 41*  ALT 113* 67*  ALKPHOS 104 80  BILITOT 0.4 0.4  PROT 6.6 6.8  ALBUMIN 2.5* 2.6*   No results found for this basename: LIPASE, AMYLASE,  in the last 72 hours CBC:  Recent Labs  02/10/13 0545 02/12/13 1048  WBC 9.9 10.6*  HGB 12.8* 12.9*  HCT 37.2* 37.4*  MCV 98.2 97.4  PLT 151 179   Cardiac Enzymes: No results found for this basename: CKTOTAL, CKMB, CKMBINDEX, TROPONINI,  in the last 72 hours BNP: No components found with this basename: POCBNP,  D-Dimer: No results found for this basename: DDIMER,  in the last 72  hours Hemoglobin A1C: No results found for this basename: HGBA1C,  in the last 72 hours Fasting Lipid Panel: No results found for this basename: CHOL, HDL, LDLCALC, TRIG, CHOLHDL, LDLDIRECT,  in the last 72 hours Thyroid Function Tests: No results found for this basename: TSH, T4TOTAL, FREET3, T3FREE, THYROIDAB,  in the last 72 hours Anemia Panel: No results found for this basename: VITAMINB12, FOLATE, FERRITIN, TIBC, IRON, RETICCTPCT,  in the last 72 hours  RADIOLOGY: Ct Abdomen Pelvis Wo Contrast  02/07/2013   CLINICAL DATA:  Epigastric pain  EXAM: CT ABDOMEN AND PELVIS WITHOUT CONTRAST  TECHNIQUE: Multidetector CT imaging of the abdomen and pelvis was performed following the standard protocol without intravenous contrast.  COMPARISON:  None.  FINDINGS: Study is limited without IV contrast. Bilateral small pleural effusion left greater than right with bilateral lower lobe posterior atelectasis. There is mild thickening of distal esophageal wall. Gastroesophageal reflux cannot be excluded.  There is moderate pericardial effusion measures up to 2 cm in thickness anteriorly. There **Note De-Identified [place] Obfuscation** is some thickening of pericardial wall. Inflammatory changes cannot be excluded. Clinical correlation is necessary.  The liver shows a micronodular contour suspicious for cirrhosis. Small perihepatic and perisplenic ascites. There is significant thickening of gallbladder wall up to 8 mm. No pericholecystic fluid is noted. Further evaluation with gallbladder ultrasound is recommended.  Nonspecific bilateral mild perinephric stranding. No nephrolithiasis. No hydronephrosis or hydroureter. Small amount of stranding and fluid noted bilateral paracolic gutter. Moderate stool noted in right colon. There is no pericecal inflammation. Some ascites is noted in right lower quadrant. Normal appendix partially visualized in axial image 53. There is moderate pelvic ascites. No small bowel obstruction. Atherosclerotic calcifications of  abdominal aorta and iliac arteries. No free abdominal air. Bilateral inguinal scrotal carinal small hernia containing fat without evidence of acute complication. There is thickening of urinary bladder wall. Chronic inflammation or mild cystitis cannot be excluded. Prostate gland measures 4 x 3.2 cm. No destructive bony lesions are noted within pelvis.  Sagittal images of the spine shows degenerative changes lumbar spine. There is partial bony fusion T12-L1 vertebral body. Mild compression deformity upper endplate of L1 of indeterminate age. Clinical correlation is necessary. There is subtle lytic lesion left aspect of L3 vertebral body measures about 1.7 cm. Best visualized on sagittal image 54. Degenerative changes are noted right anterior superior acetabulum.  IMPRESSION: 1. Bilateral small pleural effusion left greater than right with bilateral lower lobe posterior atelectasis. Moderate pericardial effusion measures up to 2 cm anteriorly. There is some thickening of pericardial wall. Inflammatory changes cannot be excluded. Clinical correlation is necessary. 2. Bilateral perihepatic and perisplenic ascites. Micro nodular liver contour suspicious for cirrhosis. 3. Thickening of gallbladder wall up to 8 mm. Further correlation with gallbladder ultrasound is recommended. No calcified gallstones are noted within gallbladder. 4. Small ascites noted bilateral paracolic gutters. Small ascites noted right lower quadrant of the abdomen. Normal appendix partially visualized. 5. Moderate pelvic ascites. No small bowel or colonic obstruction. 6. Mild thickening of urinary bladder wall. Cystitis or chronic inflammatory changes cannot be excluded. 7. Nonspecific lytic appearance of L3 vertebral body. Further correlation with bone scan is suggested to exclude metastatic disease.   Electronically Signed   By: Natasha Mead M.D.   On: 02/07/2013 14:14   Dg Chest 2 View  02/07/2013   CLINICAL DATA:  Chest pain  EXAM: CHEST  2 VIEW   COMPARISON:  None.  FINDINGS: Cardiac shadow is within normal limits. Bilateral small pleural effusions and atelectatic changes are noted. No other focal abnormality is seen.  IMPRESSION: Bibasilar changes as described.   Electronically Signed   By: Alcide Clever M.D.   On: 02/07/2013 13:58   Ct Chest Wo Contrast  02/07/2013   CLINICAL DATA:  Complaints of chest pain times 15 hr, and shortness of breath, and leg swelling  EXAM: CT CHEST WITHOUT CONTRAST  TECHNIQUE: Multidetector CT imaging of the chest was performed following the standard protocol without IV contrast.  COMPARISON:  Abdominal CT dated 02/07/2013  FINDINGS: The thoracic inlet is unremarkable. A moderate sized pericardial effusion is appreciated as described previously measuring up to 2 cm in thickness. There is no evidence of a thoracic aortic aneurysm. Very mild mural calcifications identified within the proximal descending aorta. Small bilateral pleural effusions are identified left-greater-than-right. The lung parenchyma demonstrates areas of mild ground-glass density within the dependent portions of the lung bases. A focal area of bullous change projects within the posterior lateral aspect of the superior segment right lower **Note De-Identified [place] Obfuscation** lobe. No further focal regions of consolidation or focal infiltrates are appreciated.  The visualized upper abdominal viscera as described on recent CT abdomen and pelvis.  IMPRESSION: 1. Moderate-sized pericardial effusion. 2. Small bilateral pleural effusions left greater than right. 3. Likely dependent atelectasis within the lung bases.   Electronically Signed   By: Salome Holmes M.D.   On: 02/07/2013 15:06   Nm Bone Scan Whole Body  02/10/2013   CLINICAL DATA:  Lytic lesion seen on CT  EXAM: NUCLEAR MEDICINE WHOLE BODY BONE SCAN  TECHNIQUE: Whole body anterior and posterior images were obtained approximately 3 hours after intravenous injection of radiopharmaceutical.  COMPARISON:  CT 02/07/2013   RADIOPHARMACEUTICALS:  Twenty-five mCi Technetium-99 MDP  FINDINGS: There is no abnormal radiotracer within the L3 vertebral body to correspond to the indeterminate lesion on comparison CT. No abnormal uptake within the axillary or appendicular skeleton to suggest metastasis or trauma.  IMPRESSION: No evidence of skeletal metastasis. No abnormal uptake at L3.   Electronically Signed   By: [name] M.D.   On: 02/10/2013 15:25   US Renal  02/08/2013   CLINICAL DATA:  Renal failure; elevated creatinine.  EXAM: RENAL/URINARY TRACT ULTRASOUND COMPLETE  COMPARISON:  CT of the abdomen and pelvis performed earlier today at 1:57 p.m.  FINDINGS: Right Kidney  Length: 12.4 cm. Mildly increased parenchymal echogenicity. No mass or hydronephrosis visualized.  Left Kidney  Length: 13.2 cm. Mildly increased parenchymal echogenicity. No mass or hydronephrosis visualized.  Bladder  Appears normal for degree of bladder distention.  Note is made of small volume abdominopelvic ascites. Small bilateral pleural effusions are seen, left greater than right.  IMPRESSION: 1. Mildly increased renal parenchymal echogenicity likely reflects medical renal disease. No evidence of hydronephrosis. 2. Small volume abdominopelvic ascites again noted; small bilateral pleural effusions, left greater than right.   Electronically Signed   By: Roanna Raider M.D.   On: 02/08/2013 04:14    PHYSICAL EXAM General: NAD Neck: JVP 10-12 cm, no thyromegaly or thyroid nodule.  Lungs: Decreased breath sounds at bases.  CV: Nondisplaced PMI.  Heart regular S1/S2, no S3/S4, there is a pericardial friction rub.  1+ ankle edema.  No carotid bruit.  Normal pedal pulses.  Abdomen: Soft, nontender, no hepatosplenomegaly, no distention.  Neurologic: Alert and oriented x 3.  Psych: Normal affect. Extremities: No clubbing or cyanosis.   TELEMETRY: Reviewed telemetry pt in NSR  ASSESSMENT AND PLAN: 57 yo with history of ETOH abuse and HCV presented  with chest pain.   1. Chest pain: Patient was found to have a pericardial effusion at admission.  Repeat echo yesterday showed that the effusion is beginning to organize. 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  Will confirm diagnosis with cardiac MRI.  Will treat with colchicine and steroids (no NSAIDs given recent AKI) with followup echo and office visit in 2-3 weeks. If still looks constrictive on followup echo, would plan R/LHC with possible pericardial stripping, especially if symptomatic.  2. Cirrhosis: New diagnosis.  ETOH and HCV.  Has ascites.  GI following.  3. Coffee grounds emesis: EGD today.  4. AKI: Resolved.  However, would hold off on NSAIDs.   [name] 02/12/2013 1:58 PM

## 2013-02-12 NOTE — Op Note (Signed)
**Note De-Identified [place] Obfuscation** [name] [address] [place], [address]   ENDOSCOPY PROCEDURE REPORT  PATIENT: Marc Wilson, Marc Wilson  MR#: [id number] BIRTHDATE: [birth year] , 56  yrs. old GENDER: Male ENDOSCOPIST: Iva Boop, MD, [facility] REFERRED BY: PROCEDURE DATE:  02/12/2013 PROCEDURE:  EGD, diagnostic ASA CLASS:     Class III INDICATIONS:  Hematemesis.  Cirrhosis MEDICATIONS: Fentanyl 75 mcg IV and Versed-Detailed 6 mg IV TOPICAL ANESTHETIC: Cetacaine Spray  DESCRIPTION OF PROCEDURE: After the risks benefits and alternatives of the procedure were thoroughly explained, informed consent was obtained.  The Pentax Gastroscope [id number] endoscope was introduced through the mouth and advanced to the second portion of the duodenum. Without limitations.  The instrument was slowly withdrawn as the mucosa was fully examined.      The upper, middle and distal third of the esophagus were carefully inspected and no abnormalities were noted.  The z-line was well seen at the GEJ.  The endoscope was pushed into the fundus which was normal including a retroflexed view.  The antrum, gastric body, first and second part of the duodenum were unremarkable. Retroflexed views revealed no abnormalities.     The scope was then withdrawn from the patient and the procedure completed.  COMPLICATIONS: There were no complications. ENDOSCOPIC IMPRESSION: Normal EGD  RECOMMENDATIONS: Stay on daily PPI for now as will be treated w/ prednisone for pericarditis.  May not need forever. Outpatient GI f/u Dr.  Rhea Belton. Would wait until sedation wears off before you do a cardiac MRI  REPEAT EXAM:  eSigned:  Iva Boop, MD, [facility] 02/12/2013 3:29 PM

## 2013-02-12 NOTE — Progress Notes (Signed)
**Note De-Identified [place] Obfuscation** Patient ID: Marc Wilson  male  ZOX:[id number]    DOB: 1956-03-16    DOA: 02/07/2013  PCP: No PCP Per Patient  Assessment/Plan: Principal Problem:   Chest pain Active Problems:   Pericardial effusion   Acute renal failure   Alcohol abuse   Lytic lesion of bone on x-ray   Coffee ground emesis   Liver cirrhosis, alcoholic   UTI (urinary tract infection)   Ascites   Chest pain  - likely related to vomiting rather than a cardiac cause, resolved - 2-D echo shows LV function normal, EF 55-60%, grade 1 diastolic dysfunction, no gross regional abnormalities, pericardial effusion no evidence of tamponade.   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 - Cardiology consultation and followup appreciated. - Awaiting cardiac MRI requested by cardiology. In the interim continue prednisone taper and colchicine. Followup echo at outpatient cardiology office visit 2-3 weeks and if it still looks constrictive then plans for right heart and left heart cath and possible pericardial stripping - f/u ANA + but ANA 1 titre neg, quantiferon and Rheum factor neg  - ESR mildly elevated   Coffee-ground emesis:  - History of chronic alcoholism and hep C - GI consultation and followup appreciated. EGD negative. - Continue PPI while patient on steroids and then consider DC.  Acute renal failure - Resolved  UTI  - cont Rocephin  - culture negative but will complete a course of antibiotics for 7 days  - UA on admission unimpressive except for many bacteria. NO fevers. Had leukocytosis -? etiology  Alcohol abuse  - follow for withdrawal , counseled for alcohol cessation  - No features of withdrawal  Lytic lesion of bone on x-ray  - UPEP: Area of slightly restricted mobility in the IgA and Lambda lanes. Suggest repeat in 6-8 months, if clinically  indicated. -  SPEP: The possibility of a faint restricted band(s) cannot be completely excluded in the BETA-1 region. Suggest serum IFE if clinically indicated. - bone scan negative for skeletal metastasis, no abnormal uptake at L3 - Consider OP Oncology consultation.  Liver cirrhosis, alcoholic and Hep C with Ascites (perhepatic and perisplenic)  - Gi work up in process  - Recommend OP ID consultation   DVT Prophylaxis: SCDs  Code Status: Full Disposition: not medically ready, possible DC 11/13      Subjective: Denies any specific complaints awaiting echo at time of my encounter. Patient was seen prior to EGD today   Objective: Weight change:   Intake/Output Summary (Last 24 hours) at 02/12/13 1656 Last data filed at 02/12/13 1100  Gross per 24 hour  Intake      0 ml  Output   1300 ml  Net  -1300 ml   Blood pressure 152/89, pulse 90, temperature 98 F (36.7 C), temperature source Oral, resp. rate 20, height 5\' 9"  (1.753 m), weight 83 kg (182 lb 15.7 oz), SpO2 92.00%.  Physical Exam: General: A x O x3, NAD. CVS: S1-S2 clear, no mrg Chest: CTAB Abdomen: soft nontender, nondistended, normal bowel sounds  Extremities: no c/c/e bilaterally   Lab Results: Basic Metabolic Panel:  Recent Labs Lab 02/08/13 0435  02/12/13 0705 02/12/13 1048  NA 130*  < > 136 134*  K 4.2  < > 4.0 4.0  CL 93*  < > 102 101  CO2 23  < > 24 23  GLUCOSE **Note De-Identified [place] Obfuscation** 91  < > 112* 154*  BUN 34*  < > 12 12  CREATININE 2.32*  < > 1.05 1.03  CALCIUM 8.3*  < > 8.4 8.4  MG 2.0  --   --   --   PHOS 4.8*  --   --   --   < > = values in this interval not displayed. Liver Function Tests:  Recent Labs Lab 02/10/13 0545 02/12/13 0705  AST 100* 41*  ALT 113* 67*  ALKPHOS 104 80  BILITOT 0.4 0.4  PROT 6.6 6.8  ALBUMIN 2.5* 2.6*    Recent Labs Lab 02/07/13 1051  LIPASE 15    Recent Labs Lab 02/07/13 1458  AMMONIA 34   CBC:  Recent Labs Lab 02/07/13 1051  02/10/13 0545 02/12/13 1048  WBC  24.2*  < > 9.9 10.6*  NEUTROABS 21.8*  --   --   --   HGB 15.6  < > 12.8* 12.9*  HCT 46.4  < > 37.2* 37.4*  MCV 100.7*  < > 98.2 97.4  PLT 229  < > 151 179  < > = values in this interval not displayed. Cardiac Enzymes:  Recent Labs Lab 02/07/13 2101 02/08/13 0435 02/08/13 1145  TROPONINI <0.30 <0.30 <0.30   BNP: No components found with this basename: POCBNP,  CBG: No results found for this basename: GLUCAP,  in the last 168 hours   Micro Results: Recent Results (from the past 240 hour(s))  URINE CULTURE     Status: None   Collection Time    02/07/13  5:30 PM      Result Value Range Status   Specimen Description URINE, CLEAN CATCH   Final   Special Requests NONE   Final   Culture  Setup Time     Final   Value: 02/08/2013 01:35     Performed at [facility] Count     Final   Value: NO GROWTH     Performed at [facility]   Culture     Final   Value: NO GROWTH     Performed at [facility]   Report Status 02/09/2013 FINAL   Final    Studies/Results: Ct Abdomen Pelvis Wo Contrast  02/07/2013   CLINICAL DATA:  Epigastric pain  EXAM: CT ABDOMEN AND PELVIS WITHOUT CONTRAST  TECHNIQUE: Multidetector CT imaging of the abdomen and pelvis was performed following the standard protocol without intravenous contrast.  COMPARISON:  None.  FINDINGS: Study is limited without IV contrast. Bilateral small pleural effusion left greater than right with bilateral lower lobe posterior atelectasis. There is mild thickening of distal esophageal wall. Gastroesophageal reflux cannot be excluded.  There is moderate pericardial effusion measures up to 2 cm in thickness anteriorly. There is some thickening of pericardial wall. Inflammatory changes cannot be excluded. Clinical correlation is necessary.  The liver shows a micronodular contour suspicious for cirrhosis. Small perihepatic and perisplenic ascites. There is significant thickening of gallbladder wall up to 8  mm. No pericholecystic fluid is noted. Further evaluation with gallbladder ultrasound is recommended.  Nonspecific bilateral mild perinephric stranding. No nephrolithiasis. No hydronephrosis or hydroureter. Small amount of stranding and fluid noted bilateral paracolic gutter. Moderate stool noted in right colon. There is no pericecal inflammation. Some ascites is noted in right lower quadrant. Normal appendix partially visualized in axial image 53. There is moderate pelvic ascites. No small bowel obstruction. Atherosclerotic calcifications of abdominal aorta and iliac arteries. No free abdominal air. **Note De-Identified [place] Obfuscation** Bilateral inguinal scrotal carinal small hernia containing fat without evidence of acute complication. There is thickening of urinary bladder wall. Chronic inflammation or mild cystitis cannot be excluded. Prostate gland measures 4 x 3.2 cm. No destructive bony lesions are noted within pelvis.  Sagittal images of the spine shows degenerative changes lumbar spine. There is partial bony fusion T12-L1 vertebral body. Mild compression deformity upper endplate of L1 of indeterminate age. Clinical correlation is necessary. There is subtle lytic lesion left aspect of L3 vertebral body measures about 1.7 cm. Best visualized on sagittal image 54. Degenerative changes are noted right anterior superior acetabulum.  IMPRESSION: 1. Bilateral small pleural effusion left greater than right with bilateral lower lobe posterior atelectasis. Moderate pericardial effusion measures up to 2 cm anteriorly. There is some thickening of pericardial wall. Inflammatory changes cannot be excluded. Clinical correlation is necessary. 2. Bilateral perihepatic and perisplenic ascites. Micro nodular liver contour suspicious for cirrhosis. 3. Thickening of gallbladder wall up to 8 mm. Further correlation with gallbladder ultrasound is recommended. No calcified gallstones are noted within gallbladder. 4. Small ascites noted bilateral paracolic gutters.  Small ascites noted right lower quadrant of the abdomen. Normal appendix partially visualized. 5. Moderate pelvic ascites. No small bowel or colonic obstruction. 6. Mild thickening of urinary bladder wall. Cystitis or chronic inflammatory changes cannot be excluded. 7. Nonspecific lytic appearance of L3 vertebral body. Further correlation with bone scan is suggested to exclude metastatic disease.   Electronically Signed   By: Natasha Mead M.D.   On: 02/07/2013 14:14   Dg Chest 2 View  02/07/2013   CLINICAL DATA:  Chest pain  EXAM: CHEST  2 VIEW  COMPARISON:  None.  FINDINGS: Cardiac shadow is within normal limits. Bilateral small pleural effusions and atelectatic changes are noted. No other focal abnormality is seen.  IMPRESSION: Bibasilar changes as described.   Electronically Signed   By: Alcide Clever M.D.   On: 02/07/2013 13:58   Ct Chest Wo Contrast  02/07/2013   CLINICAL DATA:  Complaints of chest pain times 15 hr, and shortness of breath, and leg swelling  EXAM: CT CHEST WITHOUT CONTRAST  TECHNIQUE: Multidetector CT imaging of the chest was performed following the standard protocol without IV contrast.  COMPARISON:  Abdominal CT dated 02/07/2013  FINDINGS: The thoracic inlet is unremarkable. A moderate sized pericardial effusion is appreciated as described previously measuring up to 2 cm in thickness. There is no evidence of a thoracic aortic aneurysm. Very mild mural calcifications identified within the proximal descending aorta. Small bilateral pleural effusions are identified left-greater-than-right. The lung parenchyma demonstrates areas of mild ground-glass density within the dependent portions of the lung bases. A focal area of bullous change projects within the posterior lateral aspect of the superior segment right lower lobe. No further focal regions of consolidation or focal infiltrates are appreciated.  The visualized upper abdominal viscera as described on recent CT abdomen and pelvis.   IMPRESSION: 1. Moderate-sized pericardial effusion. 2. Small bilateral pleural effusions left greater than right. 3. Likely dependent atelectasis within the lung bases.   Electronically Signed   By: Salome Holmes M.D.   On: 02/07/2013 15:06   US Renal  02/08/2013   CLINICAL DATA:  Renal failure; elevated creatinine.  EXAM: RENAL/URINARY TRACT ULTRASOUND COMPLETE  COMPARISON:  CT of the abdomen and pelvis performed earlier today at 1:57 p.m.  FINDINGS: Right Kidney  Length: 12.4 cm. Mildly increased parenchymal echogenicity. No mass or hydronephrosis visualized.  Left Kidney  Length: **Note De-Identified [place] Obfuscation** 13.2 cm. Mildly increased parenchymal echogenicity. No mass or hydronephrosis visualized.  Bladder  Appears normal for degree of bladder distention.  Note is made of small volume abdominopelvic ascites. Small bilateral pleural effusions are seen, left greater than right.  IMPRESSION: 1. Mildly increased renal parenchymal echogenicity likely reflects medical renal disease. No evidence of hydronephrosis. 2. Small volume abdominopelvic ascites again noted; small bilateral pleural effusions, left greater than right.   Electronically Signed   By: Roanna Raider M.D.   On: 02/08/2013 04:14    Medications: Scheduled Meds: . cefTRIAXone (ROCEPHIN) IVPB 1 gram/50 mL D5W  1 g Intravenous Q24H  . colchicine  0.6 mg Oral BID  . docusate sodium  100 mg Oral BID  . folic acid  1 mg Oral Daily  . nicotine  21 mg Transdermal Daily  . pantoprazole  40 mg Oral Daily  . [START ON 02/14/2013] predniSONE  30 mg Oral Q breakfast  . predniSONE  40 mg Oral Q breakfast  . sodium chloride  3 mL Intravenous Q12H  . sodium chloride  3 mL Intravenous Q12H  . thiamine  100 mg Oral Daily      LOS: 5 days   [facility] M.D. [facility] 02/12/2013, 4:56 PM Pager: [phone]  If 7PM-7AM, please contact night-coverage [email or web address] Password [email or web address]

## 2013-02-13 ENCOUNTER — Encounter (HOSPITAL_COMMUNITY): Payer: Self-pay | Admitting: Internal Medicine

## 2013-02-13 DIAGNOSIS — I311 Chronic constrictive pericarditis: Secondary | ICD-10-CM

## 2013-02-13 MED ORDER — PREDNISONE 10 MG PO TABS: ORAL_TABLET | ORAL | Status: DC

## 2013-02-13 MED ORDER — PANTOPRAZOLE SODIUM 40 MG PO TBEC: 40.0000 mg | DELAYED_RELEASE_TABLET | Freq: Every day | ORAL | Status: DC

## 2013-02-13 MED ORDER — FOLIC ACID 1 MG PO TABS: 1.0000 mg | ORAL_TABLET | Freq: Every day | ORAL | Status: DC

## 2013-02-13 MED ORDER — THIAMINE HCL 100 MG PO TABS: 100.0000 mg | ORAL_TABLET | Freq: Every day | ORAL | Status: DC

## 2013-02-13 MED ORDER — COLCHICINE 0.6 MG PO TABS: 0.6000 mg | ORAL_TABLET | Freq: Two times a day (BID) | ORAL | Status: DC

## 2013-02-13 NOTE — Discharge Summary (Signed)
**Note De-Identified [place] Obfuscation** Physician Discharge Summary  Mitchel Nohr WUJ:[id number] DOB: 01-Jul-1955 DOA: 02/07/2013  PCP: No PCP Per Patient  Admit date: 02/07/2013 Discharge date: 02/13/2013  Time spent: Greater than 30 minutes  Recommendations for Outpatient Follow-up:  1. Dr. [name], Cardiology on 03/03/2013 at 3 PM with repeat Echo. 2. Dr. Erick Blinks, GI 3. Recommend out patient Oncology consultation for evaluation of ? Abnormal SPEP/UPEP. 4. Recommend out patient ID consultation for evaluation and Rx of Hep C.  Discharge Diagnoses:  Principal Problem:   Chest pain Active Problems:   Pericardial effusion   Acute renal failure   Alcohol abuse   Lytic lesion of bone on x-ray   Coffee ground emesis   Liver cirrhosis, alcoholic   UTI (urinary tract infection)   Ascites   Hepatitis C   Discharge Condition: Improved & Stable  Diet recommendation: Heart Healthy diet.  Filed Weights   02/07/13 1027 02/07/13 1900  Weight: 77.111 kg (170 lb) 83 kg (182 lb 15.7 oz)    History of present illness:  57 year old male patient with history of alcoholism, tobacco abuse, Hepatitis C, presented with 3-4 month history of not feeling well & nausea, coffee ground emesis, exertional CP & dyspnea on day of admission. He was seen at [facility] ED where CT chest showed moderate pericardial effusion, cirrhosis, pleural effusions, ascitics & possible lytic lesion of L 3 . He was admitted for further evaluation & Mx.   Hospital Course:   Chest pain - was attributed to initial vomiting. Resolved. - CE's were negative - 2-D echo shows LV function normal, EF 55-60%, grade 1 diastolic dysfunction, no gross regional abnormalities, pericardial effusion no evidence of tamponade - Cardiology consulted.  Effusive Constrictive Pericarditis - Cardiology consulted. - Patient was found to have a pericardial effusion at admission. Repeat echo showed that the effusion was beginning to organize. Echo (and exam with JVD) was  concerning for the development of effusive-constrictive pericarditis.  - Cardiac MRI was also suggestive of constrictive pericarditis but changes did not appear as marked as on the echo. - He was started on Colchicine & Steroids. No NSAID's give recent AKI. - Follow up by Cardiology in office in 3 weeks.  - If still looks constrictive on followup echo, would plan R/LHC with possible pericardial stripping, especially if symptomatic.   Cirrhosis with ascites - New diagnosis - Secondary to Alcoholism & HCV (aware of Hep C PTA). Risk factors: incarceration, IV and snorted street drugs, non-professional tatoos) - Recommend OP ID consultation. Hep C can be treated in the future when alcohol stopped.  Coffee Ground Emesis - single episode - GI Consulted. - EGD was normal. - GI recommended daily PPI while on Prednisone for pericarditis. - OP follow up with GI.  AKI - Resolved after IVF - Renal US: No hydronephrosis  UTI - Started on Rocephin - UA on admission unimpressive except many bacteria. No fever. Had Leukocytosis - Culture negative but complete 7 days Abx.  Alcoholism - Cessation counseled - No withdrawal feature.  Lytic lesion of bone on x-ray  - UPEP: Area of slightly restricted mobility in the IgA and Lambda lanes. Suggest repeat in 6-8 months, if clinically indicated.  - SPEP: The possibility of a faint restricted band(s) cannot be completely excluded in the BETA-1 region. Suggest serum IFE if clinically indicated.  - bone scan negative for skeletal metastasis, no abnormal uptake at L3  - Consider OP Oncology consultation.  Mild Anemia - Stable  Thrombocytopenia - Likely due **Note De-Identified [place] Obfuscation** to ETOH & cirrhosis - resolved.   Consultations:  Cardiology  GI  Procedures:  EGD 02/12/2013: normal.   Discharge Exam:  Complaints: Denied complaints and was eager to go home.  Filed Vitals:   02/12/13 1600 02/12/13 2051 02/13/13 0529 02/13/13 1304  BP:  125/81 124/82 122/79   Pulse: 90 82  79  Temp:  97.8 F (36.6 C) 98.4 F (36.9 C) 98.7 F (37.1 C)  TempSrc:  Oral Oral Oral  Resp:  18 18 20   Height:      Weight:      SpO2: 92% 97% 96% 95%    Physical Exam:  General: A x O x3, NAD.  CVS: S1-S2 clear, no mrg  Chest: CTAB  Abdomen: soft nontender, nondistended, normal bowel sounds  Extremities: no c/c/e bilaterally   Discharge Instructions      Discharge Orders   Future Appointments Provider Department Dept Phone   03/03/2013 2:00 PM [facility] Echo Echo 3 [facility] SITE 3 ECHO LAB [phone]   03/03/2013 3:00 PM Laurey Morale, MD [facility] [place] Office [phone]   Future Orders Complete By Expires   Call MD for:  difficulty breathing, headache or visual disturbances  As directed    Call MD for:  persistant nausea and vomiting  As directed    Call MD for:  severe uncontrolled pain  As directed    Call MD for:  temperature >100.4  As directed    Diet - low sodium heart healthy  As directed    Increase activity slowly  As directed        Medication List    STOP taking these medications       BAYER BACK & BODY PAIN EX ST PO      TAKE these medications       colchicine 0.6 MG tablet  Take 1 tablet (0.6 mg total) by mouth 2 (two) times daily.     folic acid 1 MG tablet  Commonly known as:  FOLVITE  Take 1 tablet (1 mg total) by mouth daily.     pantoprazole 40 MG tablet  Commonly known as:  PROTONIX  Take 1 tablet (40 mg total) by mouth daily.     predniSONE 10 MG tablet  Commonly known as:  DELTASONE  3 tabs dailyx 3 days then 2 tabs daily x 3 days then 1 tab daily x 3 days then stop     thiamine 100 MG tablet  Take 1 tablet (100 mg total) by mouth daily.       Follow-up Information   Follow up with [name], MD On 03/03/2013. (Echocardiogram at 2 pm, MD appointment at 3 pm. )    Specialty:  Cardiology   Contact information:   [address] [place]  [address] [phone]       Schedule an appointment as soon as possible for a visit with PYRTLE, Carie Caddy, MD.   Specialty:  Gastroenterology   Contact information:   [address] [place] [address] [phone]       The results of significant diagnostics from this hospitalization (including imaging, microbiology, ancillary and laboratory) are listed below for reference.    Significant Diagnostic Studies: Ct Abdomen Pelvis Wo Contrast  02/07/2013   CLINICAL DATA:  Epigastric pain  EXAM: CT ABDOMEN AND PELVIS WITHOUT CONTRAST  TECHNIQUE: Multidetector CT imaging of the abdomen and pelvis was performed following the standard protocol without intravenous contrast. **Note De-Identified [place] Obfuscation** COMPARISON:  None.  FINDINGS: Study is limited without IV contrast. Bilateral small pleural effusion left greater than right with bilateral lower lobe posterior atelectasis. There is mild thickening of distal esophageal wall. Gastroesophageal reflux cannot be excluded.  There is moderate pericardial effusion measures up to 2 cm in thickness anteriorly. There is some thickening of pericardial wall. Inflammatory changes cannot be excluded. Clinical correlation is necessary.  The liver shows a micronodular contour suspicious for cirrhosis. Small perihepatic and perisplenic ascites. There is significant thickening of gallbladder wall up to 8 mm. No pericholecystic fluid is noted. Further evaluation with gallbladder ultrasound is recommended.  Nonspecific bilateral mild perinephric stranding. No nephrolithiasis. No hydronephrosis or hydroureter. Small amount of stranding and fluid noted bilateral paracolic gutter. Moderate stool noted in right colon. There is no pericecal inflammation. Some ascites is noted in right lower quadrant. Normal appendix partially visualized in axial image 53. There is moderate pelvic ascites. No small bowel obstruction. Atherosclerotic calcifications of abdominal aorta and iliac arteries. No free abdominal air.  Bilateral inguinal scrotal carinal small hernia containing fat without evidence of acute complication. There is thickening of urinary bladder wall. Chronic inflammation or mild cystitis cannot be excluded. Prostate gland measures 4 x 3.2 cm. No destructive bony lesions are noted within pelvis.  Sagittal images of the spine shows degenerative changes lumbar spine. There is partial bony fusion T12-L1 vertebral body. Mild compression deformity upper endplate of L1 of indeterminate age. Clinical correlation is necessary. There is subtle lytic lesion left aspect of L3 vertebral body measures about 1.7 cm. Best visualized on sagittal image 54. Degenerative changes are noted right anterior superior acetabulum.  IMPRESSION: 1. Bilateral small pleural effusion left greater than right with bilateral lower lobe posterior atelectasis. Moderate pericardial effusion measures up to 2 cm anteriorly. There is some thickening of pericardial wall. Inflammatory changes cannot be excluded. Clinical correlation is necessary. 2. Bilateral perihepatic and perisplenic ascites. Micro nodular liver contour suspicious for cirrhosis. 3. Thickening of gallbladder wall up to 8 mm. Further correlation with gallbladder ultrasound is recommended. No calcified gallstones are noted within gallbladder. 4. Small ascites noted bilateral paracolic gutters. Small ascites noted right lower quadrant of the abdomen. Normal appendix partially visualized. 5. Moderate pelvic ascites. No small bowel or colonic obstruction. 6. Mild thickening of urinary bladder wall. Cystitis or chronic inflammatory changes cannot be excluded. 7. Nonspecific lytic appearance of L3 vertebral body. Further correlation with bone scan is suggested to exclude metastatic disease.   Electronically Signed   By: Natasha Mead M.D.   On: 02/07/2013 14:14   Dg Chest 2 View  02/07/2013   CLINICAL DATA:  Chest pain  EXAM: CHEST  2 VIEW  COMPARISON:  None.  FINDINGS: Cardiac shadow is within  normal limits. Bilateral small pleural effusions and atelectatic changes are noted. No other focal abnormality is seen.  IMPRESSION: Bibasilar changes as described.   Electronically Signed   By: Alcide Clever M.D.   On: 02/07/2013 13:58   Ct Chest Wo Contrast  02/07/2013   CLINICAL DATA:  Complaints of chest pain times 15 hr, and shortness of breath, and leg swelling  EXAM: CT CHEST WITHOUT CONTRAST  TECHNIQUE: Multidetector CT imaging of the chest was performed following the standard protocol without IV contrast.  COMPARISON:  Abdominal CT dated 02/07/2013  FINDINGS: The thoracic inlet is unremarkable. A moderate sized pericardial effusion is appreciated as described previously measuring up to 2 cm in thickness. There is no evidence of a thoracic aortic **Note De-Identified [place] Obfuscation** aneurysm. Very mild mural calcifications identified within the proximal descending aorta. Small bilateral pleural effusions are identified left-greater-than-right. The lung parenchyma demonstrates areas of mild ground-glass density within the dependent portions of the lung bases. A focal area of bullous change projects within the posterior lateral aspect of the superior segment right lower lobe. No further focal regions of consolidation or focal infiltrates are appreciated.  The visualized upper abdominal viscera as described on recent CT abdomen and pelvis.  IMPRESSION: 1. Moderate-sized pericardial effusion. 2. Small bilateral pleural effusions left greater than right. 3. Likely dependent atelectasis within the lung bases.   Electronically Signed   By: Salome Holmes M.D.   On: 02/07/2013 15:06   Nm Bone Scan Whole Body  02/10/2013   CLINICAL DATA:  Lytic lesion seen on CT  EXAM: NUCLEAR MEDICINE WHOLE BODY BONE SCAN  TECHNIQUE: Whole body anterior and posterior images were obtained approximately 3 hours after intravenous injection of radiopharmaceutical.  COMPARISON:  CT 02/07/2013  RADIOPHARMACEUTICALS:  Twenty-five mCi Technetium-99 MDP  FINDINGS: There  is no abnormal radiotracer within the L3 vertebral body to correspond to the indeterminate lesion on comparison CT. No abnormal uptake within the axillary or appendicular skeleton to suggest metastasis or trauma.  IMPRESSION: No evidence of skeletal metastasis. No abnormal uptake at L3.   Electronically Signed   By: [name] M.D.   On: 02/10/2013 15:25   US Renal  02/08/2013   CLINICAL DATA:  Renal failure; elevated creatinine.  EXAM: RENAL/URINARY TRACT ULTRASOUND COMPLETE  COMPARISON:  CT of the abdomen and pelvis performed earlier today at 1:57 p.m.  FINDINGS: Right Kidney  Length: 12.4 cm. Mildly increased parenchymal echogenicity. No mass or hydronephrosis visualized.  Left Kidney  Length: 13.2 cm. Mildly increased parenchymal echogenicity. No mass or hydronephrosis visualized.  Bladder  Appears normal for degree of bladder distention.  Note is made of small volume abdominopelvic ascites. Small bilateral pleural effusions are seen, left greater than right.  IMPRESSION: 1. Mildly increased renal parenchymal echogenicity likely reflects medical renal disease. No evidence of hydronephrosis. 2. Small volume abdominopelvic ascites again noted; small bilateral pleural effusions, left greater than right.   Electronically Signed   By: Roanna Raider M.D.   On: 02/08/2013 04:14   Mr Card Morphology Wo/w Cm  02/13/2013   CLINICAL DATA:  Assess for constrictive pericarditis  EXAM: CARDIAC MRI  TECHNIQUE: The patient was scanned on a 1.5 Tesla GE magnet. A dedicated cardiac coil was used. Functional imaging was done using Fiesta sequences. 2,3, and 4 chamber views were done to assess for RWMA's. Free breathing sequences were used to assess for ventricular interdependence. Modified Simpson's rule using a short axis stack was used to calculate an ejection fraction on a dedicated work [employer]. The patient received 25 cc of Multihance. After 10 minutes inversion recovery sequences were used  to assess for infiltration and scar tissue.  CONTRAST:  25 cc Multihance  FINDINGS: Limited images of the lung fields showed a moderate left pleural effusion and a small right pleural effusion.  Normal left ventricular size and wall thickness. EF calculated to be 68%. There was septal bounce noted. On free breathing sequences, there was some leftwards shift of the septum with inspiration. Normal right ventricular size and systolic function. Mild biatrial enlargement. Trileaflet aortic valve without stenosis or significant regurgitation. There did not appear to be significant mitral regurgitation though flow sequences to quantify were not done. The pericardium was thickened with a small amount **Note De-Identified [place] Obfuscation** of pericardial fluid noted.  On delayed enhancement imaging, there was no myocardial delayed enhancement. The pericardium did enhance.  Measurements:  LV EDV 131 mL  LV SV 89 mL  LV EF 68%  IMPRESSION: 1.  Moderate left and small right pleural effusions.  2.  Normal LV and RV size and systolic function.  3. There did appear to be a degree of ventricular interdependence with leftwards shift of the interventricular septum with inspiration. The pericardium was thickened with a small amount of pericardial fluid. The septal shift did not appear as marked as on the last echocardiogram. This does suggest constrictive physiology.  Dalton Mclean   Electronically Signed   By: [name] M.D.   On: 02/13/2013 13:20    Microbiology: Recent Results (from the past 240 hour(s))  URINE CULTURE     Status: None   Collection Time    02/07/13  5:30 PM      Result Value Range Status   Specimen Description URINE, CLEAN CATCH   Final   Special Requests NONE   Final   Culture  Setup Time     Final   Value: 02/08/2013 01:35     Performed at [facility] Count     Final   Value: NO GROWTH     Performed at [facility]   Culture     Final   Value: NO GROWTH     Performed at [facility]   Report  Status 02/09/2013 FINAL   Final     Labs: Basic Metabolic Panel:  Recent Labs Lab 02/07/13 1051 02/08/13 0435 02/09/13 0420 02/10/13 0545 02/12/13 0705 02/12/13 1048  NA 133* 130* 134* 134* 136 134*  K 4.4 4.2 3.5 3.2* 4.0 4.0  CL 91* 93* 98 99 102 101  CO2 21 23 23 24 24 23   GLUCOSE 196* 91 80 100* 112* 154*  BUN 19 34* 28* 20 12 12   CREATININE 1.95* 2.32* 1.55* 1.28 1.05 1.03  CALCIUM 9.1 8.3* 7.9* 8.1* 8.4 8.4  MG  --  2.0  --   --   --   --   PHOS  --  4.8*  --   --   --   --    Liver Function Tests:  Recent Labs Lab 02/07/13 1051 02/08/13 0435 02/09/13 0420 02/10/13 0545 02/12/13 0705  AST 337* 277* 182* 100* 41*  ALT 152* 165* 141* 113* 67*  ALKPHOS 99 92 111 104 80  BILITOT 0.9 0.6 0.4 0.4 0.4  PROT 8.2 7.1 6.8 6.6 6.8  ALBUMIN 3.4* 2.9* 2.6* 2.5* 2.6*    Recent Labs Lab 02/07/13 1051  LIPASE 15    Recent Labs Lab 02/07/13 1458  AMMONIA 34   CBC:  Recent Labs Lab 02/07/13 1051  02/08/13 1145 02/08/13 2200 02/09/13 0420 02/10/13 0545 02/12/13 1048  WBC 24.2*  < > 17.0* 12.4* 11.9* 9.9 10.6*  NEUTROABS 21.8*  --   --   --   --   --   --   HGB 15.6  < > 14.0 12.2* 13.0 12.8* 12.9*  HCT 46.4  < > 40.3 35.3* 38.2* 37.2* 37.4*  MCV 100.7*  < > 97.6 97.0 99.2 98.2 97.4  PLT 229  < > 158 147* 146* 151 179  < > = values in this interval not displayed. Cardiac Enzymes:  Recent Labs Lab 02/07/13 1051 02/07/13 1457 02/07/13 2101 02/08/13 0435 02/08/13 1145  TROPONINI <0.30 <0.30 <0.30 <0.30 <0.30 **Note De-Identified [place] Obfuscation** BNP: BNP (last 3 results) No results found for this basename: PROBNP,  in the last 8760 hours CBG: No results found for this basename: GLUCAP,  in the last 168 hours  Additional labs: 1. CRP: 13.5 2. ESR: 38 3. TSH: 2.493 4. Rheumatoid Factor; < 10 5. ANA: Positive, ANA Titer 1: negative 6. AFP: 5.4 7. Hepatitis A total Ab: Non reactive, Hep B S Ab: Negative, Hep B Core Total Ab: Non reactive, Hepatitis C V RNA: Positive,   8. Mycobacteria/TB, Interferon Gamma Release Assay: Indeterminate. 9. HIV: Non reactive 10. UDS: Negative 11. 2 D Echo 02/11/2013: Study Conclusions  - Left ventricle: The cavity size was normal. Wall thickness was increased in a pattern of mild LVH. Systolic function was normal. The estimated ejection fraction was in the range of 55% to 60%. Wall motion was normal; there were no regional wall motion abnormalities. Doppler parameters are consistent with abnormal left ventricular relaxation (grade 1 diastolic dysfunction). - Left atrium: The atrium was mildly dilated. - Right atrium: The atrium was mildly dilated. - Pericardium, extracardiac: A small pericardial effusion was identified. The appearance is consistent with an effusive-constrictive pathology. Impressions:  - Normal LV function; small pericardial effusion that appears to be partially organized; septal bounce, respiratory variation and dilated IVC concerning for developing effusive constrictive pericarditis. Compared to study of 02/08/13, effusion is smaller with more prominent Organization. 12. 2 D Echo 02/08/2013: Study Conclusions  - Procedure narrative: Technically difficult study, withsuboptimal sound transmission. - Left ventricle: Mild septal and moderate posterior wall hypertrophy. Systolic function was normal. The estimated ejection fraction was in the range of 55% to 60%. Images were inadequate for LV wall motion assessment, but there are no gross regional abnormalities noted. There was an increased relative contribution of atrial contraction to ventricular filling. Doppler parameters are consistent with abnormal left ventricular relaxation (grade 1 diastolic dysfunction). - Aortic valve: Trileaflet; mildly thickened leaflets. No stenosis or regurgitation. - Left atrium: The atrium was mildly dilated. - Right atrium: Central venous pressure: 10mm Hg (est). - Systemic veins: IVC is dilated. - Pericardium,  extracardiac: A small pericardial effusion is seen, although is poorly visualized. There is a very mild degree of right atrial free wall impingement, but no frank collapse of either the right atrium or ventricle (no evidence of tamponade). Would recommend a repeat limited echo to assess effusion in 3 days. Impressions:  - A moderate size pericardial effusion is appreciated, with no current evidence of frank tamponade. Would recommend reassessment in three days with a limited echocardiogram.     Signed:  Marcellus Scott, MD, FACP, FHM. [facility] Pager [phone]  If 7PM-7AM, please contact night-coverage [email or web address] Password [facility] 02/13/2013, 2:32 PM

## 2013-02-13 NOTE — Care Management (Signed)
**Note De-Identified [place] Obfuscation** 02-13-13 1618 Tomi Bamberger, RN,BSN CM did call [facility] in [place] and they have medication available. Colchicine will be 25.00 co pay, prednisone 4.43 and protonix will be 6.85. Pt is aware of co pays. No further needs from CM at this time.

## 2013-02-13 NOTE — Progress Notes (Signed)
**Note De-Identified [place] Obfuscation** Patient ID: Marc Wilson, male   DOB: 1955-07-18, 57 y.o.   MRN: [id number]   SUBJECTIVE: No complaints today, no chest pain.  EGD normal yesterday.     . cefTRIAXone (ROCEPHIN) IVPB 1 gram/50 mL D5W  1 g Intravenous Q24H  . colchicine  0.6 mg Oral BID  . docusate sodium  100 mg Oral BID  . folic acid  1 mg Oral Daily  . nicotine  21 mg Transdermal Daily  . pantoprazole  40 mg Oral Daily  . [START ON 02/14/2013] predniSONE  30 mg Oral Q breakfast  . sodium chloride  3 mL Intravenous Q12H  . sodium chloride  3 mL Intravenous Q12H  . thiamine  100 mg Oral Daily     Filed Vitals:   02/12/13 1600 02/12/13 2051 02/13/13 0529 02/13/13 1304  BP:  125/81 124/82 122/79  Pulse: 90 82  79  Temp:  97.8 F (36.6 C) 98.4 F (36.9 C) 98.7 F (37.1 C)  TempSrc:  Oral Oral Oral  Resp:  18 18 20   Height:      Weight:      SpO2: 92% 97% 96% 95%    Intake/Output Summary (Last 24 hours) at 02/13/13 1408 Last data filed at 02/13/13 1304  Gross per 24 hour  Intake    480 ml  Output   1000 ml  Net   -520 ml    LABS: Basic Metabolic Panel:  Recent Labs  40/98/11 0705 02/12/13 1048  NA 136 134*  K 4.0 4.0  CL 102 101  CO2 24 23  GLUCOSE 112* 154*  BUN 12 12  CREATININE 1.05 1.03  CALCIUM 8.4 8.4   Liver Function Tests:  Recent Labs  02/12/13 0705  AST 41*  ALT 67*  ALKPHOS 80  BILITOT 0.4  PROT 6.8  ALBUMIN 2.6*   No results found for this basename: LIPASE, AMYLASE,  in the last 72 hours CBC:  Recent Labs  02/12/13 1048  WBC 10.6*  HGB 12.9*  HCT 37.4*  MCV 97.4  PLT 179   Cardiac Enzymes: No results found for this basename: CKTOTAL, CKMB, CKMBINDEX, TROPONINI,  in the last 72 hours BNP: No components found with this basename: POCBNP,  D-Dimer: No results found for this basename: DDIMER,  in the last 72 hours Hemoglobin A1C: No results found for this basename: HGBA1C,  in the last 72 hours Fasting Lipid Panel: No results found for this basename: CHOL,  HDL, LDLCALC, TRIG, CHOLHDL, LDLDIRECT,  in the last 72 hours Thyroid Function Tests: No results found for this basename: TSH, T4TOTAL, FREET3, T3FREE, THYROIDAB,  in the last 72 hours Anemia Panel: No results found for this basename: VITAMINB12, FOLATE, FERRITIN, TIBC, IRON, RETICCTPCT,  in the last 72 hours  RADIOLOGY: Ct Abdomen Pelvis Wo Contrast  02/07/2013   CLINICAL DATA:  Epigastric pain  EXAM: CT ABDOMEN AND PELVIS WITHOUT CONTRAST  TECHNIQUE: Multidetector CT imaging of the abdomen and pelvis was performed following the standard protocol without intravenous contrast.  COMPARISON:  None.  FINDINGS: Study is limited without IV contrast. Bilateral small pleural effusion left greater than right with bilateral lower lobe posterior atelectasis. There is mild thickening of distal esophageal wall. Gastroesophageal reflux cannot be excluded.  There is moderate pericardial effusion measures up to 2 cm in thickness anteriorly. There is some thickening of pericardial wall. Inflammatory changes cannot be excluded. Clinical correlation is necessary.  The liver shows a micronodular contour suspicious for cirrhosis. Small perihepatic and perisplenic ascites. **Note De-Identified Marc Wilson Obfuscation** There is significant thickening of gallbladder wall up to 8 mm. No pericholecystic fluid is noted. Further evaluation with gallbladder ultrasound is recommended.  Nonspecific bilateral mild perinephric stranding. No nephrolithiasis. No hydronephrosis or hydroureter. Small amount of stranding and fluid noted bilateral paracolic gutter. Moderate stool noted in right colon. There is no pericecal inflammation. Some ascites is noted in right lower quadrant. Normal appendix partially visualized in axial image 53. There is moderate pelvic ascites. No small bowel obstruction. Atherosclerotic calcifications of abdominal aorta and iliac arteries. No free abdominal air. Bilateral inguinal scrotal carinal small hernia containing fat without evidence of acute complication.  There is thickening of urinary bladder wall. Chronic inflammation or mild cystitis cannot be excluded. Prostate gland measures 4 x 3.2 cm. No destructive bony lesions are noted within pelvis.  Sagittal images of the spine shows degenerative changes lumbar spine. There is partial bony fusion T12-L1 vertebral body. Mild compression deformity upper endplate of L1 of indeterminate age. Clinical correlation is necessary. There is subtle lytic lesion left aspect of L3 vertebral body measures about 1.7 cm. Best visualized on sagittal image 54. Degenerative changes are noted right anterior superior acetabulum.  IMPRESSION: 1. Bilateral small pleural effusion left greater than right with bilateral lower lobe posterior atelectasis. Moderate pericardial effusion measures up to 2 cm anteriorly. There is some thickening of pericardial wall. Inflammatory changes cannot be excluded. Clinical correlation is necessary. 2. Bilateral perihepatic and perisplenic ascites. Micro nodular liver contour suspicious for cirrhosis. 3. Thickening of gallbladder wall up to 8 mm. Further correlation with gallbladder ultrasound is recommended. No calcified gallstones are noted within gallbladder. 4. Small ascites noted bilateral paracolic gutters. Small ascites noted right lower quadrant of the abdomen. Normal appendix partially visualized. 5. Moderate pelvic ascites. No small bowel or colonic obstruction. 6. Mild thickening of urinary bladder wall. Cystitis or chronic inflammatory changes cannot be excluded. 7. Nonspecific lytic appearance of L3 vertebral body. Further correlation with bone scan is suggested to exclude metastatic disease.   Electronically Signed   By: Marc Wilson M.D.   On: 02/07/2013 14:14   Dg Chest 2 View  02/07/2013   CLINICAL DATA:  Chest pain  EXAM: CHEST  2 VIEW  COMPARISON:  None.  FINDINGS: Cardiac shadow is within normal limits. Bilateral small pleural effusions and atelectatic changes are noted. No other focal  abnormality is seen.  IMPRESSION: Bibasilar changes as described.   Electronically Signed   By: Marc Wilson M.D.   On: 02/07/2013 13:58   Ct Chest Wo Contrast  02/07/2013   CLINICAL DATA:  Complaints of chest pain times 15 hr, and shortness of breath, and leg swelling  EXAM: CT CHEST WITHOUT CONTRAST  TECHNIQUE: Multidetector CT imaging of the chest was performed following the standard protocol without IV contrast.  COMPARISON:  Abdominal CT dated 02/07/2013  FINDINGS: The thoracic inlet is unremarkable. A moderate sized pericardial effusion is appreciated as described previously measuring up to 2 cm in thickness. There is no evidence of a thoracic aortic aneurysm. Very mild mural calcifications identified within the proximal descending aorta. Small bilateral pleural effusions are identified left-greater-than-right. The lung parenchyma demonstrates areas of mild ground-glass density within the dependent portions of the lung bases. A focal area of bullous change projects within the posterior lateral aspect of the superior segment right lower lobe. No further focal regions of consolidation or focal infiltrates are appreciated.  The visualized upper abdominal viscera as described on recent CT abdomen and pelvis.  IMPRESSION: 1. Moderate-sized **Note De-Identified Marc Wilson Obfuscation** pericardial effusion. 2. Small bilateral pleural effusions left greater than right. 3. Likely dependent atelectasis within the lung bases.   Electronically Signed   By: Marc Wilson M.D.   On: 02/07/2013 15:06   Nm Bone Scan Whole Body  02/10/2013   CLINICAL DATA:  Lytic lesion seen on CT  EXAM: NUCLEAR MEDICINE WHOLE BODY BONE SCAN  TECHNIQUE: Whole body anterior and posterior images were obtained approximately 3 hours after intravenous injection of radiopharmaceutical.  COMPARISON:  CT 02/07/2013  RADIOPHARMACEUTICALS:  Twenty-five mCi Technetium-99 MDP  FINDINGS: There is no abnormal radiotracer within the L3 vertebral body to correspond to the indeterminate lesion on  comparison CT. No abnormal uptake within the axillary or appendicular skeleton to suggest metastasis or trauma.  IMPRESSION: No evidence of skeletal metastasis. No abnormal uptake at L3.   Electronically Signed   By: Marc Wilson M.D.   On: 02/10/2013 15:25   US Renal  02/08/2013   CLINICAL DATA:  Renal failure; elevated creatinine.  EXAM: RENAL/URINARY TRACT ULTRASOUND COMPLETE  COMPARISON:  CT of the abdomen and pelvis performed earlier today at 1:57 p.m.  FINDINGS: Right Kidney  Length: 12.4 cm. Mildly increased parenchymal echogenicity. No mass or hydronephrosis visualized.  Left Kidney  Length: 13.2 cm. Mildly increased parenchymal echogenicity. No mass or hydronephrosis visualized.  Bladder  Appears normal for degree of bladder distention.  Note is made of small volume abdominopelvic ascites. Small bilateral pleural effusions are seen, left greater than right.  IMPRESSION: 1. Mildly increased renal parenchymal echogenicity likely reflects medical renal disease. No evidence of hydronephrosis. 2. Small volume abdominopelvic ascites again noted; small bilateral pleural effusions, left greater than right.   Electronically Signed   By: Marc Wilson M.D.   On: 02/08/2013 04:14    PHYSICAL EXAM General: NAD Neck: JVP 10 cm, no thyromegaly or thyroid nodule.  Lungs: Decreased breath sounds at bases.  CV: Nondisplaced PMI.  Heart regular S1/S2, no S3/S4, there is a pericardial friction rub.  1+ ankle edema.  No carotid bruit.  Normal pedal pulses.  Abdomen: Soft, nontender, no hepatosplenomegaly, no distention.  Neurologic: Alert and oriented x 3.  Psych: Normal affect. Extremities: No clubbing or cyanosis.   TELEMETRY: Reviewed telemetry pt in NSR  ASSESSMENT AND PLAN: 57 yo with history of ETOH abuse and HCV presented with chest pain.   1. Chest pain: Patient was found to have a pericardial effusion at admission.  Repeat echo Tuesday showed that the effusion is beginning to organize. 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  Will treat with colchicine and steroids (no NSAIDs given recent AKI) with followup echo and office visit in 3 weeks. If still looks constrictive on followup echo, would plan R/LHC with possible pericardial stripping, especially if symptomatic.  2. Cirrhosis: New diagnosis.  ETOH and HCV.  Has ascites.  GI following.  3. Coffee grounds emesis: EGD today.  4. AKI: Resolved.  However, would hold off on NSAIDs.  5. Would discharge on colchicine 0.6 mg daily and prednisone taper 30 mg daily x 3 days then 20 mg daily x 3 days then 10 mg daily x 3 days then stop.  I will need to see in 3 wks with repeat echo.   [name] 02/13/2013 2:08 PM

## 2013-02-14 ENCOUNTER — Encounter: Payer: Self-pay | Admitting: Nurse Practitioner

## 2013-02-18 ENCOUNTER — Encounter: Payer: Self-pay | Admitting: Nurse Practitioner

## 2013-02-18 ENCOUNTER — Ambulatory Visit (INDEPENDENT_AMBULATORY_CARE_PROVIDER_SITE_OTHER): Payer: BC Managed Care – PPO | Admitting: Nurse Practitioner

## 2013-02-18 ENCOUNTER — Other Ambulatory Visit: Payer: BC Managed Care – PPO

## 2013-02-18 VITALS — BP 128/80 | HR 92 | Ht 69.0 in | Wt 175.2 lb

## 2013-02-18 DIAGNOSIS — B1920 Unspecified viral hepatitis C without hepatic coma: Secondary | ICD-10-CM

## 2013-02-18 DIAGNOSIS — F1010 Alcohol abuse, uncomplicated: Secondary | ICD-10-CM

## 2013-02-18 DIAGNOSIS — Z23 Encounter for immunization: Secondary | ICD-10-CM

## 2013-02-18 DIAGNOSIS — K7030 Alcoholic cirrhosis of liver without ascites: Secondary | ICD-10-CM

## 2013-02-18 NOTE — Patient Instructions (Addendum)
**Note De-Identified [place] Obfuscation** Please go to the basement level to have your labs drawn.  We have given you an injection, Hepatitis A and B combined.   We have also given you information on this vaccine.    Please come to our office on 02-25-2013 for the next Hep A/B injection. The appointment time is 9:30 am.  Try to stay on a 2000 mg sodium diet.   No alcohol !! Good job on tobacco cessation Return for follow up with Dr. Erick Blinks  in 2 months  Take the Protonix until gone then you can stop.

## 2013-02-18 NOTE — Progress Notes (Signed)
**Note De-Identified [place] Obfuscation** HPI :  Patient is a 57 year old male diagnosed with HCV / ETOH cirrhosis during a recent hospital admission. Patient presented to the emergency department 02/07/13 with chest pain, abdominal pain and vomiting. Labs in emergency department pertinent for white count of 24,000, creatinine 1.95, AST 337, ALT 152. Noncontrast CT scan revealed small bilateral pleural effusions, moderate pericardial effusion, thickening of pericardial wall, ascites, cirrhosis, thickened gallbladder wall (see results below). Because of an episode of hematemesis patient was eventually taken for EGD which was normal. His hospital stay was mainly centered around evaluation /treatment of the pericardial effusion which was of undetermined etiology.  HIV negative.  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 Patient was treated inpatient and discharged home on colchicine and steroids with plans for repeat echo in 3 weeks.   Patient here today for hospital follow up. No abdominal pain. No nausea / vomiting. No significant peripheral edema. No increase in abdominal girth. He is still drinking ETOH, though much less than before.   Past Medical History  Diagnosis Date  . Alcoholism   . Hepatitis C 02/10/2013  . Pericardial effusion 02/07/2013  . Lytic lesion of bone on x-ray 02/07/2013  . Acute renal failure 02/07/2013  . UTI (urinary tract infection) 02/07/2013  . History of endoscopy 02/2013    Family History  Problem Relation Age of Onset  . Diabetes type II Mother   . Diabetes type II Father   . Alcoholism Father   . Colon cancer Neg Hx   . Throat cancer Neg Hx   . Heart disease Neg Hx   . Kidney disease Neg Hx   . Cirrhosis Father    History  Substance Use Topics  . Smoking status: Former Smoker -- 2.00 packs/day    Types: Cigarettes    Quit date: 02/06/2013  . Smokeless tobacco: Not on file  . Alcohol Use: 7.2 oz/week    12 Cans of beer per week     Comment: 12 beers  daily x 40 years--- as of 02-18-13, has 2 beers daily.   Current Outpatient Prescriptions  Medication Sig Dispense Refill  . colchicine 0.6 MG tablet Take 1 tablet (0.6 mg total) by mouth 2 (two) times daily.  60 tablet  0  . folic acid (FOLVITE) 1 MG tablet Take 1 tablet (1 mg total) by mouth daily.  30 tablet  0  . pantoprazole (PROTONIX) 40 MG tablet Take 1 tablet (40 mg total) by mouth daily.  30 tablet  0  . predniSONE (DELTASONE) 10 MG tablet 3 tabs dailyx 3 days then 2 tabs daily x 3 days then 1 tab daily x 3 days then stop  18 tablet  0  . thiamine 100 MG tablet Take 1 tablet (100 mg total) by mouth daily.  30 tablet  0   No current facility-administered medications for this visit.   No Known Allergies  Review of Systems: All systems reviewed and negative except where noted in HPI.   Ct Abdomen Pelvis Wo Contrast  02/07/2013   CLINICAL DATA:  Epigastric pain  EXAM: CT ABDOMEN AND PELVIS WITHOUT CONTRAST  TECHNIQUE: Multidetector CT imaging of the abdomen and pelvis was performed following the standard protocol without intravenous contrast.  COMPARISON:  None.  FINDINGS: Study is limited without IV contrast. Bilateral small pleural effusion left greater than right with bilateral lower lobe posterior atelectasis. There is mild thickening of distal esophageal wall. Gastroesophageal reflux cannot be excluded.  There is moderate pericardial effusion measures **Note De-Identified [place] Obfuscation** up to 2 cm in thickness anteriorly. There is some thickening of pericardial wall. Inflammatory changes cannot be excluded. Clinical correlation is necessary.  The liver shows a micronodular contour suspicious for cirrhosis. Small perihepatic and perisplenic ascites. There is significant thickening of gallbladder wall up to 8 mm. No pericholecystic fluid is noted. Further evaluation with gallbladder ultrasound is recommended.  Nonspecific bilateral mild perinephric stranding. No nephrolithiasis. No hydronephrosis or hydroureter. Small amount  of stranding and fluid noted bilateral paracolic gutter. Moderate stool noted in right colon. There is no pericecal inflammation. Some ascites is noted in right lower quadrant. Normal appendix partially visualized in axial image 53. There is moderate pelvic ascites. No small bowel obstruction. Atherosclerotic calcifications of abdominal aorta and iliac arteries. No free abdominal air. Bilateral inguinal scrotal carinal small hernia containing fat without evidence of acute complication. There is thickening of urinary bladder wall. Chronic inflammation or mild cystitis cannot be excluded. Prostate gland measures 4 x 3.2 cm. No destructive bony lesions are noted within pelvis.  Sagittal images of the spine shows degenerative changes lumbar spine. There is partial bony fusion T12-L1 vertebral body. Mild compression deformity upper endplate of L1 of indeterminate age. Clinical correlation is necessary. There is subtle lytic lesion left aspect of L3 vertebral body measures about 1.7 cm. Best visualized on sagittal image 54. Degenerative changes are noted right anterior superior acetabulum.  IMPRESSION: 1. Bilateral small pleural effusion left greater than right with bilateral lower lobe posterior atelectasis. Moderate pericardial effusion measures up to 2 cm anteriorly. There is some thickening of pericardial wall. Inflammatory changes cannot be excluded. Clinical correlation is necessary. 2. Bilateral perihepatic and perisplenic ascites. Micro nodular liver contour suspicious for cirrhosis. 3. Thickening of gallbladder wall up to 8 mm. Further correlation with gallbladder ultrasound is recommended. No calcified gallstones are noted within gallbladder. 4. Small ascites noted bilateral paracolic gutters. Small ascites noted right lower quadrant of the abdomen. Normal appendix partially visualized. 5. Moderate pelvic ascites. No small bowel or colonic obstruction. 6. Mild thickening of urinary bladder wall. Cystitis or  chronic inflammatory changes cannot be excluded. 7. Nonspecific lytic appearance of L3 vertebral body. Further correlation with bone scan is suggested to exclude metastatic disease.   Electronically Signed   By: Natasha Mead M.D.   On: 02/07/2013 14:14    Physical Exam: BP 128/80  Pulse 92  Ht 5\' 9"  (1.753 m)  Wt 175 lb 3.2 oz (79.47 kg)  BMI 25.86 kg/m2 Constitutional: Pleasant,well-developed, white male in no acute distress. HEENT: Normocephalic and atraumatic. Conjunctivae are normal. No scleral icterus. Neck supple.  Cardiovascular: Normal rate, regular rhythm.  Pulmonary/chest: Effort normal and breath sounds normal. No wheezing, rales or rhonchi. Abdominal: Soft, nondistended, nontender. Bowel sounds active throughout. There are no masses palpable. No hepatomegaly. Extremities: no edema Lymphadenopathy: No cervical adenopathy noted. Neurological: Alert and oriented to person place and time. No asterixis Skin: Skin is warm and dry. No rashes noted. Psychiatric: Normal mood and affect. Behavior is normal.   ASSESSMENT AND PLAN:  1. Newly diagnosed HCV/ETOH cirrhosis with volume ascites. No varices on EGD. No appreciable ascites on exam today. Will refer for treatment of HCV if he will stop drinking.   Needs Hep A,B vaccinations, will give today  Check AFP   2gm sodium diet  Stop ETOH!!  Return office visit in 2 months, or sooner if needed.   No need for diuretics at this point.   2. Recent episode of hematemesis (during admission). EGD negative. **Note De-Identified [place] Obfuscation** Hgb stabliized around 12.9. Recommend completion of current rx of pantoprazole then may discontinue  3. Tobacco abuse. He hasn't smoked in several days, has patch.   4. Lytic lesion of L3. No evidence for malignancy on bone scan  5. ARF, resolved.   6. Pericardial effusion. Patient has follow up with cardiology in near future.

## 2013-02-19 DIAGNOSIS — B1920 Unspecified viral hepatitis C without hepatic coma: Secondary | ICD-10-CM | POA: Insufficient documentation

## 2013-02-19 LAB — AFP TUMOR MARKER: AFP-Tumor Marker: 5.4 ng/mL (ref 0.0–8.0)

## 2013-02-20 ENCOUNTER — Telehealth: Payer: Self-pay | Admitting: Cardiology

## 2013-02-20 NOTE — Telephone Encounter (Signed)
**Note De-Identified [place] Obfuscation** New message      Need dr to fax to [employer] a note saying he is still under dr care and cannot return to work until further notice.  Fax [phone] attn Serina Cowper

## 2013-02-20 NOTE — Telephone Encounter (Signed)
**Note De-Identified [place] Obfuscation** Per Dr Almon Hercules can remain out of work until appt with him 03/03/13. Written note signed by Dr Shirlee Latch and given to HIM to fax to [employer]. Pt advised.

## 2013-02-21 ENCOUNTER — Encounter: Payer: Self-pay | Admitting: Family Medicine

## 2013-02-21 ENCOUNTER — Ambulatory Visit (INDEPENDENT_AMBULATORY_CARE_PROVIDER_SITE_OTHER): Payer: BC Managed Care – PPO | Admitting: Family Medicine

## 2013-02-21 VITALS — BP 133/77 | HR 88 | Temp 98.2°F | Ht 69.0 in | Wt 168.4 lb

## 2013-02-21 DIAGNOSIS — F1020 Alcohol dependence, uncomplicated: Secondary | ICD-10-CM

## 2013-02-21 DIAGNOSIS — M899 Disorder of bone, unspecified: Secondary | ICD-10-CM

## 2013-02-21 DIAGNOSIS — M129 Arthropathy, unspecified: Secondary | ICD-10-CM

## 2013-02-21 DIAGNOSIS — F411 Generalized anxiety disorder: Secondary | ICD-10-CM

## 2013-02-21 DIAGNOSIS — M1990 Unspecified osteoarthritis, unspecified site: Secondary | ICD-10-CM

## 2013-02-21 LAB — POCT CBC
Granulocyte percent: 83.9 %G — AB (ref 37–80)
HCT, POC: 48.9 % (ref 43.5–53.7)
Hemoglobin: 15.9 g/dL (ref 14.1–18.1)
Lymph, poc: 1.6 (ref 0.6–3.4)
MCH, POC: 31.8 pg — AB (ref 27–31.2)
MCHC: 32.6 g/dL (ref 31.8–35.4)
MCV: 97.4 fL — AB (ref 80–97)
MPV: 7 fL (ref 0–99.8)
POC Granulocyte: 10.8 — AB (ref 2–6.9)
POC LYMPH PERCENT: 12.6 %L (ref 10–50)
Platelet Count, POC: 297 10*3/uL (ref 142–424)
RBC: 5 M/uL (ref 4.69–6.13)
RDW, POC: 13 %
WBC: 12.9 10*3/uL — AB (ref 4.6–10.2)

## 2013-02-21 MED ORDER — SERTRALINE HCL 50 MG PO TABS: 50.0000 mg | ORAL_TABLET | Freq: Every day | ORAL | Status: DC

## 2013-02-21 MED ORDER — THIAMINE HCL 100 MG PO TABS: 100.0000 mg | ORAL_TABLET | Freq: Every day | ORAL | Status: DC

## 2013-02-21 MED ORDER — COLCHICINE 0.6 MG PO TABS: 0.6000 mg | ORAL_TABLET | Freq: Two times a day (BID) | ORAL | Status: DC

## 2013-02-21 MED ORDER — FOLIC ACID 1 MG PO TABS: 1.0000 mg | ORAL_TABLET | Freq: Every day | ORAL | Status: DC

## 2013-02-21 NOTE — Progress Notes (Signed)
**Note De-identified [place] Obfuscation**  **Note De-Identified [place] Obfuscation** Subjective:    Patient ID: Marc Wilson, male    DOB: 02/10/1956, 57 y.o.   MRN: [id number]  HPI This 57 y.o. male presents for evaluation of follow up from recent hospitalization. He has hx of alcoholism and developed some N/V at home, chest pain, and  Had CT of chest, abdomen, pelvis showing possible L3 lytic lesion, gallbladder wall thickening, Moderate pericardial effusion, cirrhosis, abdominal ascites.  He suffered from acute renal  Failure and had hemataemesis.  He is scheduled to follow up with cardiology and  GI.  A bone scan was recommended per Radiology to rule out bone metastasis. He has quit drinking and quit smoking.  He has probable hepatitis C.   Review of Systems    No chest pain, SOB, HA, dizziness, vision change, N/V, diarrhea, constipation, dysuria, urinary urgency or frequency, myalgias, arthralgias or rash.  Objective:   Physical Exam  Vital signs noted  Well developed well nourished male.  HEENT - Head atraumatic Normocephalic                Eyes - PERRLA, Conjuctiva - clear Sclera- Clear EOMI                Ears - EAC's Wnl TM's Wnl Gross Hearing WNL                Nose - Nares patent                 Throat - oropharanx wnl Respiratory - Lungs CTA bilateral Cardiac - RRR S1 and S2 without murmur GI - Abdomen soft Nontender and bowel sounds active x 4 Extremities - No edema. Neuro - Grossly intact.      Assessment & Plan:  Lytic lesion of bone on x-ray - Plan: NM Bone Scan Whole Body  Alcoholism - Plan: folic acid (FOLVITE) 1 MG tablet, thiamine 100 MG tablet, POCT CBC, CMP14+EGFR  Arthritis - Plan: colchicine 0.6 MG tablet, POCT CBC, CMP14+EGFR  Anxiety state, unspecified - Plan: sertraline (ZOLOFT) 50 MG tablet  Follow up in one month.  Deatra Canter FNP

## 2013-02-22 LAB — CMP14+EGFR
ALT: 45 IU/L — ABNORMAL HIGH (ref 0–44)
AST: 41 IU/L — ABNORMAL HIGH (ref 0–40)
Albumin/Globulin Ratio: 1.2 (ref 1.1–2.5)
Albumin: 4.1 g/dL (ref 3.5–5.5)
Alkaline Phosphatase: 98 IU/L (ref 39–117)
BUN/Creatinine Ratio: 20 (ref 9–20)
BUN: 25 mg/dL — ABNORMAL HIGH (ref 6–24)
CO2: 24 mmol/L (ref 18–29)
Calcium: 9.9 mg/dL (ref 8.7–10.2)
Chloride: 96 mmol/L — ABNORMAL LOW (ref 97–108)
Creatinine, Ser: 1.23 mg/dL (ref 0.76–1.27)
GFR calc Af Amer: 76 mL/min/{1.73_m2} (ref >59)
GFR calc non Af Amer: 66 mL/min/{1.73_m2} (ref >59)
Globulin, Total: 3.3 g/dL (ref 1.5–4.5)
Glucose: 106 mg/dL — ABNORMAL HIGH (ref 65–99)
Potassium: 4.9 mmol/L (ref 3.5–5.2)
Sodium: 138 mmol/L (ref 134–144)
Total Bilirubin: 0.4 mg/dL (ref 0.0–1.2)
Total Protein: 7.4 g/dL (ref 6.0–8.5)

## 2013-02-25 ENCOUNTER — Ambulatory Visit (INDEPENDENT_AMBULATORY_CARE_PROVIDER_SITE_OTHER): Payer: BC Managed Care – PPO | Admitting: Internal Medicine

## 2013-02-25 DIAGNOSIS — Z23 Encounter for immunization: Secondary | ICD-10-CM

## 2013-02-25 DIAGNOSIS — B1920 Unspecified viral hepatitis C without hepatic coma: Secondary | ICD-10-CM

## 2013-03-03 ENCOUNTER — Encounter: Payer: Self-pay | Admitting: Cardiology

## 2013-03-03 ENCOUNTER — Ambulatory Visit (HOSPITAL_COMMUNITY): Payer: BC Managed Care – PPO | Attending: Cardiology | Admitting: Cardiology

## 2013-03-03 ENCOUNTER — Ambulatory Visit (INDEPENDENT_AMBULATORY_CARE_PROVIDER_SITE_OTHER): Payer: BC Managed Care – PPO | Admitting: Cardiology

## 2013-03-03 ENCOUNTER — Other Ambulatory Visit (HOSPITAL_COMMUNITY): Payer: Self-pay | Admitting: Cardiology

## 2013-03-03 VITALS — BP 130/90 | HR 77 | Ht 69.0 in | Wt 168.0 lb

## 2013-03-03 DIAGNOSIS — I319 Disease of pericardium, unspecified: Secondary | ICD-10-CM | POA: Insufficient documentation

## 2013-03-03 DIAGNOSIS — M1990 Unspecified osteoarthritis, unspecified site: Secondary | ICD-10-CM

## 2013-03-03 DIAGNOSIS — I311 Chronic constrictive pericarditis: Secondary | ICD-10-CM

## 2013-03-03 DIAGNOSIS — M129 Arthropathy, unspecified: Secondary | ICD-10-CM

## 2013-03-03 DIAGNOSIS — I318 Other specified diseases of pericardium: Secondary | ICD-10-CM

## 2013-03-03 DIAGNOSIS — R079 Chest pain, unspecified: Secondary | ICD-10-CM | POA: Insufficient documentation

## 2013-03-03 DIAGNOSIS — Z87891 Personal history of nicotine dependence: Secondary | ICD-10-CM | POA: Insufficient documentation

## 2013-03-03 DIAGNOSIS — I313 Pericardial effusion (noninflammatory): Secondary | ICD-10-CM

## 2013-03-03 MED ORDER — COLCHICINE 0.6 MG PO TABS: 0.6000 mg | ORAL_TABLET | Freq: Every day | ORAL | Status: DC

## 2013-03-03 NOTE — Patient Instructions (Addendum)
**Note De-Identified [place] Obfuscation** Decrease colchicine to 0.6mg  daily.  Your physician has requested that you have an echocardiogram. Echocardiography is a painless test that uses sound waves to create images of your heart. It provides your doctor with information about the size and shape of your heart and how well your heart's chambers and valves are working. This procedure takes approximately one hour. There are no restrictions for this procedure. IN 3 MONTHS.  Your physician recommends that you schedule a follow-up appointment in: 3 months with Dr Shirlee Latch after you have the echo done.

## 2013-03-03 NOTE — Progress Notes (Signed)
**Note De-identified [place] Obfuscation** Limited echo performed. 

## 2013-03-04 DIAGNOSIS — I318 Other specified diseases of pericardium: Secondary | ICD-10-CM | POA: Insufficient documentation

## 2013-03-04 NOTE — Progress Notes (Signed)
**Note De-Identified Plude Obfuscation** Patient ID: Marc Wilson, male   DOB: [birth year], 57 y.o.   MRN: [id number] PCP: Purcell Nails  57 yo with history of ETOH abuse and HCV with cirrhosis who is seen in followup after a recent admission with acute pericarditis.  Patient was admitted in 11/4 with pleuritic chest pain.  He was found to have a moderate pericardial effusion without tamponade.  Followup echo later in his stay showed that the pericardial effusion was much smaller but he had developed effusive-contrictive physiology by echo.  Cardiac MRI confirmed this, showing ventricular interdependence and a thickened, enhancing pericardium.  He had AKI, so was treated with colchicine but no NSAIDs.    He has been feeling great since getting home.  He is back at work. He is no longer smoking and has cut way back on ETOH.  He has mild chronic dyspnea walking up a steep hill or walking fast up steps.  No chest pain.  He had an echo done today which I reviewed.  He still has septal bounce present but not as marked as prior.  His IVC is not dilated and does show normal respirophasic variation.    ECG: NSR, inferior and anterolateral TWIs with QTc 468  Labs (11/14): K 4.9, creatinine 1.2  PMH: 1. ETOHism 2. HCV 3. 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 4. AKI in 11/14, resolved.  5. Faintly abnormal SPEP and UPEP. 6. 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  Initial echo showed a moderate pericardial effusion without tamponade.  Repeat echo showed a small effusion but there was concern for development of effusive constrictive pericarditis.  Cardiac MRI in 11/14 showed EF 68%, normal RV, ventricular interdependence with free breathing, and a thickened, enhancing pericardium.  Repeat echo 12/14 showed that septal bounce was still present but the IVC was not significantly dilated and showed normal respiratory variation.   7. Suspect COPD  SH: Quit smoking in 11/14.  12 cans beer/day x 40 years.  Quit in 11/14.  Lives  in [place]. Works for [employer].   FH: ETOH, diabetes  ROS: All systems reviewed and negative except as per HPI.   Current Outpatient Prescriptions  Medication Sig Dispense Refill  . colchicine 0.6 MG tablet Take 1 tablet (0.6 mg total) by mouth daily.  30 tablet  4  . folic acid (FOLVITE) 1 MG tablet Take 1 tablet (1 mg total) by mouth daily.  30 tablet  0  . pantoprazole (PROTONIX) 40 MG tablet Take 1 tablet (40 mg total) by mouth daily.  30 tablet  0  . predniSONE (DELTASONE) 10 MG tablet 3 tabs dailyx 3 days then 2 tabs daily x 3 days then 1 tab daily x 3 days then stop  18 tablet  0  . sertraline (ZOLOFT) 50 MG tablet Take 1 tablet (50 mg total) by mouth daily.  30 tablet  3  . thiamine 100 MG tablet Take 1 tablet (100 mg total) by mouth daily.  30 tablet  0   No current facility-administered medications for this visit.    BP 130/90  Pulse 77  Ht 5\' 9"  (1.753 m)  Wt 168 lb (76.204 kg)  BMI 24.80 kg/m2 General: NAD Neck: JVP 7-8 cm, no thyromegaly or thyroid nodule.  Lungs: Distant breath sounds bilaterally.  CV: Nondisplaced PMI.  Heart regular S1/S2, no S3/S4, no murmur.  No peripheral edema.  No carotid bruit.  Normal pedal pulses.  Abdomen: Soft, nontender, no hepatosplenomegaly, no distention.  Skin: Intact **Note De-Identified Copelin Obfuscation** without lesions or rashes.  Neurologic: Alert and oriented x 3.  Psych: Normal affect. Extremities: No clubbing or cyanosis.   Assessment/Plan: 1. Effusive-constrictive pericarditis: Echo and cardiac MRI while in the hospital after developing acute pericarditis were very worrisome for effusive-constrictive pericarditis.  He has not been on NSAIDs due to AKI while in the hospital.  He has been on colchicine.  Today's echo still shows septal bounce (not as much as on the prior echo) and the IVC is not markedly dilated and shows respirophasic variation.  He has minimal symptoms.  - Continue to monitor: Followup in 3 months to reassess symptoms and will get  echo at that time.  - Continue colchicine for 3 months.  Can decrease to once daily today.  2. COPD: Very distant lung sounds.  Suspect COPD.  I congratulated him for staying off cigarettes. 3. ETOH abuse: He has cut back a lot.  I asked him to try to stop altogether.   [name] 03/04/2013

## 2013-03-12 ENCOUNTER — Encounter: Payer: Self-pay | Admitting: Family Medicine

## 2013-03-12 ENCOUNTER — Ambulatory Visit (INDEPENDENT_AMBULATORY_CARE_PROVIDER_SITE_OTHER): Payer: BC Managed Care – PPO | Admitting: Family Medicine

## 2013-03-12 VITALS — BP 150/85 | HR 93 | Temp 98.1°F | Wt 172.0 lb

## 2013-03-12 DIAGNOSIS — F1020 Alcohol dependence, uncomplicated: Secondary | ICD-10-CM

## 2013-03-12 DIAGNOSIS — F411 Generalized anxiety disorder: Secondary | ICD-10-CM

## 2013-03-12 DIAGNOSIS — M129 Arthropathy, unspecified: Secondary | ICD-10-CM

## 2013-03-12 DIAGNOSIS — M1990 Unspecified osteoarthritis, unspecified site: Secondary | ICD-10-CM

## 2013-03-12 DIAGNOSIS — I311 Chronic constrictive pericarditis: Secondary | ICD-10-CM

## 2013-03-12 MED ORDER — PANTOPRAZOLE SODIUM 40 MG PO TBEC: 40.0000 mg | DELAYED_RELEASE_TABLET | Freq: Every day | ORAL | Status: DC

## 2013-03-12 MED ORDER — SERTRALINE HCL 50 MG PO TABS: 50.0000 mg | ORAL_TABLET | Freq: Every day | ORAL | Status: DC

## 2013-03-12 MED ORDER — THIAMINE HCL 100 MG PO TABS: 100.0000 mg | ORAL_TABLET | Freq: Every day | ORAL | Status: DC

## 2013-03-12 MED ORDER — FOLIC ACID 1 MG PO TABS: 1.0000 mg | ORAL_TABLET | Freq: Every day | ORAL | Status: DC

## 2013-03-13 NOTE — Progress Notes (Signed)
**Note De-identified Godino Obfuscation**   **Note De-Identified Espey Obfuscation** Subjective:    Patient ID: Marc Wilson, male    DOB: 1955-12-26, 57 y.o.   MRN: [id number]  HPI This 57 y.o. male presents for evaluation of follow up from hospitalization for pericardial Effusion, chest pain, and GI bleed.  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 He has quit smoking and he states he has had a few drinks but has cut back a lot with ETOH. He had a lytic lesion on xray and had a bone scan which was negative.     Review of Systems    No chest pain, SOB, HA, dizziness, vision change, N/V, diarrhea, constipation, dysuria, urinary urgency or frequency, myalgias, arthralgias or rash.  Objective:   Physical Exam Vital signs noted  Well developed well nourished male.  HEENT - Head atraumatic Normocephalic                Eyes - PERRLA, Conjuctiva - clear Sclera- Clear EOMI                Ears - EAC's Wnl TM's Wnl Gross Hearing WNL                Nose - Nares patent                 Throat - oropharanx wnl Respiratory - Lungs CTA bilateral Cardiac - RRR S1 and S2 without murmur GI - Abdomen soft Nontender and bowel sounds active x 4 Extremities - No edema. Neuro - Grossly intact.       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  Deatra Canter FNP

## 2013-03-14 ENCOUNTER — Ambulatory Visit: Payer: BC Managed Care – PPO | Admitting: Family Medicine

## 2013-03-21 ENCOUNTER — Telehealth: Payer: Self-pay | Admitting: Internal Medicine

## 2013-03-21 NOTE — Telephone Encounter (Signed)
**Note De-Identified Schwarting Obfuscation** Pt reports his car broke down and he can't keep making 75 mile trips for this shot because his car is so old. Explained after this injection, he won't need another one for a year. R/S to 12/23/12 so pt may find a ride.

## 2013-03-24 ENCOUNTER — Ambulatory Visit (INDEPENDENT_AMBULATORY_CARE_PROVIDER_SITE_OTHER): Payer: BC Managed Care – PPO | Admitting: Internal Medicine

## 2013-03-24 DIAGNOSIS — Z23 Encounter for immunization: Secondary | ICD-10-CM

## 2013-06-02 ENCOUNTER — Other Ambulatory Visit (HOSPITAL_COMMUNITY): Payer: BC Managed Care – PPO

## 2013-06-04 ENCOUNTER — Ambulatory Visit: Payer: BC Managed Care – PPO | Admitting: Cardiology

## 2013-07-01 ENCOUNTER — Encounter: Payer: Self-pay | Admitting: Cardiology

## 2013-07-11 ENCOUNTER — Ambulatory Visit: Payer: BC Managed Care – PPO | Admitting: Family Medicine

## 2016-11-24 ENCOUNTER — Encounter: Payer: Self-pay | Admitting: Family Medicine

## 2016-11-24 ENCOUNTER — Ambulatory Visit (INDEPENDENT_AMBULATORY_CARE_PROVIDER_SITE_OTHER): Payer: BLUE CROSS/BLUE SHIELD | Admitting: Family Medicine

## 2016-11-24 ENCOUNTER — Ambulatory Visit (INDEPENDENT_AMBULATORY_CARE_PROVIDER_SITE_OTHER): Payer: BLUE CROSS/BLUE SHIELD

## 2016-11-24 VITALS — BP 128/82 | HR 78 | Temp 97.7°F | Ht 68.0 in | Wt 155.0 lb

## 2016-11-24 DIAGNOSIS — R938 Abnormal findings on diagnostic imaging of other specified body structures: Secondary | ICD-10-CM

## 2016-11-24 DIAGNOSIS — Z711 Person with feared health complaint in whom no diagnosis is made: Secondary | ICD-10-CM | POA: Diagnosis not present

## 2016-11-24 DIAGNOSIS — R634 Abnormal weight loss: Secondary | ICD-10-CM

## 2016-11-24 DIAGNOSIS — R05 Cough: Secondary | ICD-10-CM

## 2016-11-24 DIAGNOSIS — L989 Disorder of the skin and subcutaneous tissue, unspecified: Secondary | ICD-10-CM

## 2016-11-24 DIAGNOSIS — R053 Chronic cough: Secondary | ICD-10-CM

## 2016-11-24 DIAGNOSIS — F1020 Alcohol dependence, uncomplicated: Secondary | ICD-10-CM | POA: Diagnosis not present

## 2016-11-24 DIAGNOSIS — R9389 Abnormal findings on diagnostic imaging of other specified body structures: Secondary | ICD-10-CM

## 2016-11-24 DIAGNOSIS — F17200 Nicotine dependence, unspecified, uncomplicated: Secondary | ICD-10-CM | POA: Diagnosis not present

## 2016-11-24 MED ORDER — SULFAMETHOXAZOLE-TRIMETHOPRIM 800-160 MG PO TABS: 1.0000 | ORAL_TABLET | Freq: Two times a day (BID) | ORAL | 0 refills | Status: DC

## 2016-11-24 MED ORDER — DOXYCYCLINE HYCLATE 100 MG PO TABS: 100.0000 mg | ORAL_TABLET | Freq: Two times a day (BID) | ORAL | 0 refills | Status: DC

## 2016-11-24 MED ORDER — PREDNISONE 20 MG PO TABS: 40.0000 mg | ORAL_TABLET | Freq: Every day | ORAL | 0 refills | Status: AC

## 2016-11-24 NOTE — Assessment & Plan Note (Signed)
**Note De-Identified Streb Obfuscation** Again, cannot rule out skin cancer. I have a referral to dermatology for evaluation of this lesion. He most certainly will need a biopsy given his associated temporal lesions.

## 2016-11-24 NOTE — Assessment & Plan Note (Signed)
**Note De-Identified Toscano Obfuscation** I have a very strong concern for cancer given the nature, chronicity, in general appearance of these lesions. I am also concerned because he's had unplanned weight loss over the last couple of years, has generalized fatigue, and night sweats. His smoking status and alcohol consumption also put him at high risk for developing cancers. Given their location over his temporal arteries, I felt it to be in his best interest to have these biopsied with a dermatologist. I did discuss the urgency of having these biopsied giving the fact that they're growing. Patient voiced good understanding of this. I'm concerned however that his finances will to turn him from actually seeing the dermatologist for this biopsy. We'll attempt to get him set up with a dermatologist at a teaching institution that may be more understanding of his financial situation. While these lesions do not appear grossly infected, patient did note that when he brushes his hair at the occasionally express exudate of some sort. This was not appreciated on today's exam but they too have mild overlying erythema and therefore doxycycline 100 mg by mouth twice a day for the next 10 days was prescribed. Advised the patient to eat with this medication. He has no known drug allergies. In this medication does not require adjustment for renal or hepatic insufficiency at this time. Patient to follow-up in our clinic if lesions become more symptomatic, including developing signs and symptoms of infection or start bleeding uncontrollably.

## 2016-11-24 NOTE — Progress Notes (Addendum)
**Note De-Identified Frasier Obfuscation** Subjective: NU:UVOZ lesion, cough, reestablish care PCP: Timmothy Euler, MD [id number] Marc Wilson is a 61 y.o. male presenting to clinic today for:  1. Spots on temples Patient notes that he developed a small crusty spots on his temples starting on the left side then progressed to the right side roughly 2-3 years ago. He notes that these lesions have gradually gotten larger. They are both painful to touch. Denies spontaneous bleeding or bleeding when he touches them. He does note that when he's combing his hair he occasionally has exudate that comes out of them. He denies personal or family history of skin cancer. He has tried applying Neosporin to affected areas with no improvement. He does note that he has hepatitis C and a history of alcoholic cirrhosis. He reports he's also had unintentional weight loss over the last couple of years. He reports that appetite is fair. He continues to tolerate oral fluids and food without difficulty. Denies hematemesis, hematochezia, melena, hematuria, abdominal pain, nausea, vomiting, visual changes, headache. He endorses night sweats that have been ongoing for a couple of years. He reports fatigue. No fevers.  2. Chronic cough Patient reports a long-standing (at least 3 years) history of chronic cough. He notes that the cough is actually gotten worse over the last 2 weeks. He notes associated chest congestion and phlegm with cough. He reports mild sore throat after coughing a lot. He is using Mucinex that has helped a little with chest congestion. He is also used a sore throat spray that helped. Denies sick contacts, recent travel, hemoptysis. He is a long-time smoker reports a 2 pack per day smoking history for the last 40 years. Again he endorses unplanned weight loss over the last couple of years, fatigue, night sweats. No shortness of breath or wheeze. No chest pain. No acid reflux, nausea, vomiting belching lower extremity swelling.  He reports feeling weaker  than normal. He denies abnormal neurologic symptoms.  He is not on any medications.  3. Alcoholism/cirrhosis Patient reports that he has had a long-standing history of chronic alcohol use. He notes that he used to drink a case of beer a day. He now drinks maybe 6-7 beers per day. He notes he is drink less lately because of feeling under the weather. He denies hematemesis or melena. No melena. No nausea, vomiting, abdominal pain or swelling in the abdomen. No loss of consciousness. Family history significant for alcoholism and cirrhosis.   No Known Allergies Past Medical History:  Diagnosis Date  . Acute renal failure ([place]) 02/07/2013  . Alcoholism ([place])   . Hepatitis C 02/10/2013  . Lytic lesion of bone on x-ray 02/07/2013  . Pericardial effusion 02/07/2013  . UTI (urinary tract infection) 02/07/2013   Family History  Problem Relation Age of Onset  . Diabetes type II Mother   . Diabetes type II Father   . Alcoholism Father   . Cirrhosis Father   . Colon cancer Neg Hx   . Throat cancer Neg Hx   . Heart disease Neg Hx   . Kidney disease Neg Hx    Social Hx: an active 2ppd smoker.Current medications reviewed.   ROS: Per HPI  Objective: Office vital signs reviewed. BP 128/82   Pulse 78   Temp 97.7 F (36.5 C) (Oral)   Ht _0  (1.727 m)   Wt 155 lb (70.3 kg)   BMI 23.57 kg/m   Physical Examination:  General: Awake, alert, cachetic, No acute distress HEENT:     Neck: **Note De-Identified Mcpeek Obfuscation** No masses palpated. No lymphadenopathy palpable. No JVD. No carotid bruit.    Ears: Tympanic membranes intact, normal light reflex, no erythema, no bulging    Eyes: PERRLA, no arcus senilis. Sclera white.    Nose: nasal turbinates moist, no nasal discharge    Throat: moist mucus membranes, no erythema, no tonsillar exudate.  Airway is patent Cardio: regular rate and rhythm, S1S2 heard, no murmurs appreciated Pulm: Decreased breath sounds on the right lung base posteriorly with associated intermittent inspiratory  wheeze, otherwise, global prolonged expiratory phase. No rhonchi or rales; normal work of breathing on room air GI: soft, non-tender, non-distended, no ascites, bowel sounds present 4 Skin: dry;   Right chest: Just under the clavicle there is a firm 2.1 cm x 1 cm subcutaneous nodule with what appears to be a horny protrusion coming from the 12:00 position of the lesion. Lesion is mildly tender. There is no fluctuance palpated. No induration. No exudate or bleeding from the lesion. There is a slight increase and pink discoloration/hyperpigmentation over the lesion. No frank erythema. See associated photograph below.  Left temple: There is a firm 2.5 cm x 1 cm ulcerated lesion with central yellow crusting. The lesion extends into his hairline near the ear. Lesion is non-exudative and nonbleeding. There is no palpable fluctuance. There is mild tenderness to palpation. There is an erythematous discoloration that does not extend beyond the lesion. See associated photograph below  Right temple: There are 2 lesions in this area. The first is a firm 1.7 cm x 2 cm ulcerated lesion with central yellow crusting and located over the right temple. This lesion is mildly erythematous with what appears to be small vessels extending from lesion subcutaneously. The lesion is nonbleeding non-exudative. There is no palpable induration or fluctuance. The second lesion is firm and roughly 0.5 cm in diameter. It also has a central yellow crusting. The borders of this lesion appear rolled. This lesion is also nonbleeding, nonexudative, nonfluctuant and without palpable induration. See associated photograph below  Right chest:   Left temple:      Right temple:      Dg Chest 2 View  Result Date: 11/24/2016 CLINICAL DATA:  Cough/smoker, multiple skin nodules. EXAM: CHEST  2 VIEW COMPARISON:  Chest x-ray dated 02/07/2013. FINDINGS: Heart size and mediastinal contours are within normal limits. Atherosclerotic changes  noted at the aortic arch. Lungs are hyperexpanded suggesting COPD. Chronic bronchitic changes noted centrally. Subtle ill-defined opacities are seen within the mid and lower lung zones bilaterally, possibly accentuated by overlying costal cartilage. No pleural effusion or pneumothorax seen. No acute or suspicious osseous finding. Chronic compression fracture deformity noted at the thoracolumbar junction, stable. IMPRESSION: 1. Hyperexpanded lungs indicating COPD. Associated chronic bronchitic changes centrally. 2. Subtle small ill-defined opacities within the mid and lower lung zones bilaterally, possibly related to the aforementioned bronchitic changes, possibly accentuated by overlying costal cartilage, less likely pneumonia. Given the history of cough and smoking, recommend chest CT for further characterization. These results will be called to the ordering clinician or representative by the Radiologist Assistant, and communication documented in the PACS or zVision Dashboard. Electronically Signed   By: Franki Cabot M.D.   On: 11/24/2016 11:44    Assessment/ Plan: 61 y.o. male   Alcohol use disorder, moderate, dependence ([place]) Patient continues to use large amounts of alcohol upwards of 7 beers daily. He has a known history of alcoholic cirrhosis with concomitant hepatitis C infection. It appears that this has been addressed previously **Note De-Identified Sunderlin Obfuscation** however he was lost to follow-up secondary to lack of insurance and financial constraints. We reviewed that his alcohol use puts his health at risk especially with known liver cirrhosis. Patient is pre-contemplative.  Skin lesion of face I have a very strong concern for cancer given the nature, chronicity, in general appearance of these lesions. I am also concerned because he's had unplanned weight loss over the last couple of years, has generalized fatigue, and night sweats. His smoking status and alcohol consumption also put him at high risk for developing cancers.  Given their location over his temporal arteries, I felt it to be in his best interest to have these biopsied with a dermatologist. I did discuss the urgency of having these biopsied giving the fact that they're growing. Patient voiced good understanding of this. I'm concerned however that his finances will to turn him from actually seeing the dermatologist for this biopsy. We'll attempt to get him set up with a dermatologist at a teaching institution that may be more understanding of his financial situation. While these lesions do not appear grossly infected, patient did note that when he brushes his hair at the occasionally express exudate of some sort. This was not appreciated on today's exam but they too have mild overlying erythema and therefore doxycycline 100 mg by mouth twice a day for the next 10 days was prescribed. Advised the patient to eat with this medication. He has no known drug allergies. In this medication does not require adjustment for renal or hepatic insufficiency at this time. Patient to follow-up in our clinic if lesions become more symptomatic, including developing signs and symptoms of infection or start bleeding uncontrollably.  Chest skin lesion Again, cannot rule out skin cancer. I have a referral to dermatology for evaluation of this lesion. He most certainly will need a biopsy given his associated temporal lesions.  Weight loss, non-intentional This is concerning given his long standing history of smoking and alcohol use. He has known hepatitis C and alcoholic cirrhosis which also could be contributing. CMP and CBCs were obtained today. Chest x-ray consistent with COPD. There were some unusual ill-defined opacities in the mid and lower lungs bilaterally. Upon personal review of the chest x-ray, opacities appear more apparent on the left side. This is also where I heard decreased breath sounds with associated mild inspiratory wheeze. He had a CT scan done of his chest in November  2014 which I reviewed the report of. There were some groundglass findings within the bases of his lungs on that report. It does not appear that he's had follow-up of this since that imaging study. I do have a concern for lung cancer given his smoking status, fatigue, and associated chronic cough. I will order a CT of his chest for further characterization. I recommended that he seek emergent evaluation should he start coughing up blood, have shortness of breath or wheeze.  Chronic cough His chest x-ray today showed changes consistent with COPD. His physical exam demonstrated a barrel chest which would be consistent with this. He is also been a very long time smoker. I am going to treat his chronic cough as a COPD exacerbation with doxycycline 100 mg by mouth twice a day. I have ordered this to be done for 10 days rather than the typical 7 because he also has skin lesions that may or may not be infected. I have also ordered a prednisone 40 mg by mouth daily burst for the next 5 days. I reviewed the instructions **Note De-Identified Arps Obfuscation** for use of the medications with the patient at length. A written prescription was provided. I'm not entirely convinced that his chronic cough is the results of his COPD exacerbation. He has also had weight loss, fatigue and findings on chest x-ray that cannot rule out pathologic lesions. Therefore CT chest will be ordered. I encouraged him to follow up with his primary care physician to review and form a plan regarding these findings.  Tobacco use disorder, severe, dependence He has an 80 year pack history. Patient is pre-contemplative. We discussed the impact of tobacco use has on both healing, risk of cancer, and overall health. We will obtain CT of chest for aftermentioned reasons. Even if malignancy is not found, I would recommend that he continue to have low dose CT scans of his chest since he is over the age of 7 and has a strong smoking history. We'll need an abdominal ultrasound at age 34 to  screen for AAA as well given his smoking history. Recommend continuing to encourage smoking cessation at each visit.    Orders Placed This Encounter  Procedures  . DG Chest 2 View    Standing Status:   Future    Number of Occurrences:   1    Standing Expiration Date:   01/24/2018    Order Specific Question:   Reason for Exam (SYMPTOM  OR DIAGNOSIS REQUIRED)    Answer:   congestion    Order Specific Question:   Preferred imaging location?    Answer:   Internal  . CT Chest Wo Contrast    Standing Status:   Future    Standing Expiration Date:   01/24/2018    Order Specific Question:   Preferred imaging location?    Answer:   [facility]    Order Specific Question:   Radiology Contrast Protocol - do NOT remove file path    Answer:   [email or web address]    Order Specific Question:   Reason for Exam additional comments    Answer:   abnormal CXR 11/24/16; chronic cough, 80 pk year smoker, concern for lung cancer.  Marland Kitchen CBC with Differential  . CMP14+EGFR  . Ambulatory referral to Dermatology    Referral Priority:   Urgent    Referral Type:   Consultation    Referral Reason:   Specialty Services Required    Requested Specialty:   Dermatology    Number of Visits Requested:   1   Meds ordered this encounter  Medications  . DISCONTD: sulfamethoxazole-trimethoprim (BACTRIM DS) 800-160 MG tablet    Sig: Take 1 tablet by mouth 2 (two) times daily.    Dispense:  14 tablet    Refill:  0  . doxycycline (VIBRA-TABS) 100 MG tablet    Sig: Take 1 tablet (100 mg total) by mouth 2 (two) times daily.    Dispense:  20 tablet    Refill:  0  . predniSONE (DELTASONE) 20 MG tablet    Sig: Take 2 tablets (40 mg total) by mouth daily with breakfast.    Dispense:  10 tablet    Refill:  0   Total time spent face to face with patient 52 minutes.  Greater than 50% of encounter spent in coordination of care/ counseling.  Janora Norlander, DO [place] [phone]

## 2016-11-24 NOTE — Assessment & Plan Note (Signed)
**Note De-Identified Cordial Obfuscation** Patient continues to use large amounts of alcohol upwards of 7 beers daily. He has a known history of alcoholic cirrhosis with concomitant hepatitis C infection. It appears that this has been addressed previously however he was lost to follow-up secondary to lack of insurance and financial constraints. We reviewed that his alcohol use puts his health at risk especially with known liver cirrhosis. Patient is pre-contemplative.

## 2016-11-24 NOTE — Patient Instructions (Addendum)
**Note De-Identified Wainwright Obfuscation** I have placed an urgent referral to dermatology for biopsy of the spots on your skin. I highly recommend that you keep this appointment. We will work with you with regards to finances if needed. I have large concern that these may be cancerous in nature. In the interim, I have prescribed an antibiotic to see if this may improve them. Please take this as directed. If you have any side effects, please give our office a call.  As we discussed, I highly recommend that you stop smoking. This will not only improve her cough, but also improve your health in general.   Health Risks of Smoking Smoking cigarettes is very bad for your health. Tobacco smoke has over 200 known poisons in it. It contains the poisonous gases nitrogen oxide and carbon monoxide. There are over 60 chemicals in tobacco smoke that cause cancer. Smoking is difficult to quit because a chemical in tobacco, called nicotine, causes addiction or dependence. When you smoke and inhale, nicotine is absorbed rapidly into the bloodstream through your lungs. Both inhaled and non-inhaled nicotine may be addictive. What are the risks of cigarette smoke? Cigarette smokers have an increased risk of many serious medical problems, including:  Lung cancer.  Lung disease, such as pneumonia, bronchitis, and emphysema.  Chest pain (angina) and heart attack because the heart is not getting enough oxygen.  Heart disease and peripheral blood vessel disease.  High blood pressure (hypertension).  Stroke.  Oral cancer, including cancer of the lip, mouth, or voice box.  Bladder cancer.  Pancreatic cancer.  Cervical cancer.  Pregnancy complications, including premature birth.  Stillbirths and smaller newborn babies, birth defects, and genetic damage to sperm.  Early menopause.  Lower estrogen level for women.  Infertility.  Facial wrinkles.  Blindness.  Increased risk of broken bones (fractures).  Senile dementia.  Stomach ulcers and  internal bleeding.  Delayed wound healing and increased risk of complications during surgery.  Even smoking lightly shortens your life expectancy by several years.  Because of secondhand smoke exposure, children of smokers have an increased risk of the following:  Sudden infant death syndrome (SIDS).  Respiratory infections.  Lung cancer.  Heart disease.  Ear infections.  What are the benefits of quitting? There are many health benefits of quitting smoking. Here are some of them:  Within days of quitting smoking, your risk of having a heart attack decreases, your blood flow improves, and your lung capacity improves. Blood pressure, pulse rate, and breathing patterns start returning to normal soon after quitting.  Within months, your lungs may clear up completely.  Quitting for 10 years reduces your risk of developing lung cancer and heart disease to almost that of a nonsmoker.  People who quit may see an improvement in their overall quality of life.  How do I quit smoking? Smoking is an addiction with both physical and psychological effects, and longtime habits can be hard to change. Your health care provider can recommend:  Programs and community resources, which may include group support, education, or talk therapy.  Prescription medicines to help reduce cravings.  Nicotine replacement products, such as patches, gum, and nasal sprays. Use these products only as directed. Do not replace cigarette smoking with electronic cigarettes, which are commonly called e-cigarettes. The safety of e-cigarettes is not known, and some may contain harmful chemicals.  A combination of two or more of these methods.  Where to find more information:  American Lung Association: www.lung.org  American Cancer Society: www.cancer.org Summary  Smoking **Note De-Identified Stenzel Obfuscation** cigarettes is very bad for your health. Cigarette smokers have an increased risk of many serious medical problems, including several cancers,  heart disease, and stroke.  Smoking is an addiction with both physical and psychological effects, and longtime habits can be hard to change.  By stopping right away, you can greatly reduce the risk of medical problems for you and your family.  To help you quit smoking, your health care provider can recommend programs, community resources, prescription medicines, and nicotine replacement products such as patches, gum, and nasal sprays. This information is not intended to replace advice given to you by your health care provider. Make sure you discuss any questions you have with your health care provider. Document Released: 04/27/2004 Document Revised: 03/24/2016 Document Reviewed: 03/24/2016 Elsevier Interactive Patient Education  2017 [employer].

## 2016-11-24 NOTE — Assessment & Plan Note (Signed)
**Note De-Identified Iribe Obfuscation** His chest x-ray today showed changes consistent with COPD. His physical exam demonstrated a barrel chest which would be consistent with this. He is also been a very long time smoker. I am going to treat his chronic cough as a COPD exacerbation with doxycycline 100 mg by mouth twice a day. I have ordered this to be done for 10 days rather than the typical 7 because he also has skin lesions that may or may not be infected. I have also ordered a prednisone 40 mg by mouth daily burst for the next 5 days. I reviewed the instructions for use of the medications with the patient at length. A written prescription was provided. I'm not entirely convinced that his chronic cough is the results of his COPD exacerbation. He has also had weight loss, fatigue and findings on chest x-ray that cannot rule out pathologic lesions. Therefore CT chest will be ordered. I encouraged him to follow up with his primary care physician to review and form a plan regarding these findings.

## 2016-11-24 NOTE — Assessment & Plan Note (Signed)
**Note De-Identified Coggins Obfuscation** This is concerning given his long standing history of smoking and alcohol use. He has known hepatitis C and alcoholic cirrhosis which also could be contributing. CMP and CBCs were obtained today. Chest x-ray consistent with COPD. There were some unusual ill-defined opacities in the mid and lower lungs bilaterally. Upon personal review of the chest x-ray, opacities appear more apparent on the left side. This is also where I heard decreased breath sounds with associated mild inspiratory wheeze. He had a CT scan done of his chest in November 2014 which I reviewed the report of. There were some groundglass findings within the bases of his lungs on that report. It does not appear that he's had follow-up of this since that imaging study. I do have a concern for lung cancer given his smoking status, fatigue, and associated chronic cough. I will order a CT of his chest for further characterization. I recommended that he seek emergent evaluation should he start coughing up blood, have shortness of breath or wheeze.

## 2016-11-24 NOTE — Assessment & Plan Note (Addendum)
**Note De-Identified Mostek Obfuscation** He has an 80 year pack history. Patient is pre-contemplative. We discussed the impact of tobacco use has on both healing, risk of cancer, and overall health. We will obtain CT of chest for aftermentioned reasons. Even if malignancy is not found, I would recommend that he continue to have low dose CT scans of his chest since he is over the age of 64 and has a strong smoking history. We'll need an abdominal ultrasound at age 61 to screen for AAA as well given his smoking history. Recommend continuing to encourage smoking cessation at each visit.

## 2016-11-25 LAB — CMP14+EGFR
ALBUMIN: 3.8 g/dL (ref 3.5–5.5)
ALT: 30 IU/L (ref 0–44)
AST: 38 IU/L (ref 0–40)
Albumin/Globulin Ratio: 1.1 — ABNORMAL LOW (ref 1.2–2.2)
Alkaline Phosphatase: 128 IU/L — ABNORMAL HIGH (ref 39–117)
BILIRUBIN TOTAL: 0.6 mg/dL (ref 0.0–1.2)
BUN / CREAT RATIO: 13 (ref 9–20)
BUN: 11 mg/dL (ref 6–24)
CHLORIDE: 97 mmol/L (ref 96–106)
CO2: 20 mmol/L (ref 20–29)
CREATININE: 0.86 mg/dL (ref 0.76–1.27)
Calcium: 9 mg/dL (ref 8.7–10.2)
GFR calc non Af Amer: 96 mL/min/{1.73_m2} (ref >59)
GFR, EST AFRICAN AMERICAN: 110 mL/min/{1.73_m2} (ref >59)
Globulin, Total: 3.4 g/dL (ref 1.5–4.5)
Glucose: 102 mg/dL — ABNORMAL HIGH (ref 65–99)
Potassium: 3.7 mmol/L (ref 3.5–5.2)
Sodium: 133 mmol/L — ABNORMAL LOW (ref 134–144)
TOTAL PROTEIN: 7.2 g/dL (ref 6.0–8.5)

## 2016-11-25 LAB — CBC WITH DIFFERENTIAL/PLATELET
BASOS: 0 %
Basophils Absolute: 0 10*3/uL (ref 0.0–0.2)
EOS (ABSOLUTE): 0.1 10*3/uL (ref 0.0–0.4)
EOS: 1 %
HEMOGLOBIN: 16.7 g/dL (ref 13.0–17.7)
Hematocrit: 44.9 % (ref 37.5–51.0)
Immature Grans (Abs): 0.1 10*3/uL (ref 0.0–0.1)
Immature Granulocytes: 1 %
Lymphocytes Absolute: 1.3 10*3/uL (ref 0.7–3.1)
Lymphs: 6 %
MCH: 35.5 pg — ABNORMAL HIGH (ref 26.6–33.0)
MCHC: 37.2 g/dL — ABNORMAL HIGH (ref 31.5–35.7)
MCV: 96 fL (ref 79–97)
MONOCYTES: 10 %
Monocytes Absolute: 1.9 10*3/uL — ABNORMAL HIGH (ref 0.1–0.9)
Neutrophils Absolute: 16.4 10*3/uL — ABNORMAL HIGH (ref 1.4–7.0)
Neutrophils: 82 %
Platelets: 233 10*3/uL (ref 150–379)
RBC: 4.7 x10E6/uL (ref 4.14–5.80)
RDW: 12.3 % (ref 12.3–15.4)
WBC: 19.8 10*3/uL — AB (ref 3.4–10.8)

## 2016-11-27 ENCOUNTER — Telehealth: Payer: Self-pay | Admitting: Family Medicine

## 2016-11-27 ENCOUNTER — Encounter: Payer: Self-pay | Admitting: Family Medicine

## 2016-11-28 NOTE — Telephone Encounter (Signed)
**Note De-Identified Shockley Obfuscation** Carlon handled as of 11/28/16 at 9:30 am

## 2016-11-29 ENCOUNTER — Encounter: Payer: Self-pay | Admitting: Family Medicine

## 2016-11-29 ENCOUNTER — Telehealth: Payer: Self-pay | Admitting: Family Medicine

## 2016-11-29 ENCOUNTER — Ambulatory Visit: Payer: Self-pay | Admitting: Family Medicine

## 2016-11-29 DIAGNOSIS — J439 Emphysema, unspecified: Secondary | ICD-10-CM | POA: Insufficient documentation

## 2016-11-29 DIAGNOSIS — S32010A Wedge compression fracture of first lumbar vertebra, initial encounter for closed fracture: Secondary | ICD-10-CM | POA: Insufficient documentation

## 2016-11-29 NOTE — Telephone Encounter (Signed)
**Note De-Identified Cast Obfuscation** I discussed/reviewed patient's CAT scan from 8/28 with patient over the phone. I reviewed that it appears he's got significant changes associated with emphysema. We discussed consideration for initiation of inhaled treatment. Additionally, I reviewed that he has what looks to be compression fracture of L1. I discussed that this may be related to the previously seen lytic lesions during his hospitalization several years ago. There is a concern for multiple myeloma at that time. He notes that he never pursued workup of this because of financial constraints/lack of insurance. He would be open to evaluating these further at [place] since they are able to offer him financial assistance.  He notes that his symptoms are improving. He has a persistent productive cough. No hemoptysis. Mild shortness of breath with activity, that is not his baseline. No fevers, chills, nausea, vomiting. No chest pain or diaphoresis. He notes he is feeling much better.  Additionally, he had biopsies done with dermatology yesterday. I am awaiting pathology results from this. I anticipate that it'll take at least 2 weeks before we get these back. Therefore, he will see me in 2 weeks and we develop a solid plan pending these results.  Patient had no other questions or concerns.  Ashly M. Lajuana Ripple, [facility]

## 2016-12-05 NOTE — Progress Notes (Signed)
**Note De-Identified Riano Obfuscation** Subjective: CC: follow up COPD, skin biopsy PCP: Marc Norlander, DO RSW:NIOEVOJ Marc Wilson is a 61 y.o. male presenting to clinic today for:  Patient was seen about 2 weeks ago for bilateral simple lesions and chronic cough. He was referred to dermatology at [facility]. He had biopsies obtained of bilateral temples and an area on his chest.  The dermatologist echoed my concern about skin cancer. He says that he was actually called and informed that they believe this is cancer. He has not been back for follow-up visit yet, but is expecting a call with an appointment. Patient reports that lesions have been stable. No bleeding, purulence. Tenderness has improved as well.  He had a CT chest performed at [facility], which revealed emphysema and other changes consistent with COPD. No evidence of malignancy on that imaging study. He was treated as a COPD exacerbation with doxycycline and prednisone. He was also provided samples of Symbicort, which he has tolerated fairly well. He does note that he has a weird taste in his mouth after use. He also cites that food does not taste like they used to since starting this medication.  He reports that breathing has improved significantly since last visit. He reports that he feels like he is back to his baseline. No Hemoptysis, fevers, chills, wheeze, LE edema. Endorses dyspnea on exertion.  Patient continues to smoke tobacco daily. He is in the action phase of discontinuing. He recognizes the impact this has on his breathing and risk for cancer. Reports dyspnea on exertion, walking up hills. No hemoptysis. Occasional morning cough that has clear mucus. No fevers, no chills.  No Known Allergies Past Medical History:  Diagnosis Date  . Acute renal failure ([place]) 02/07/2013  . Alcoholism ([place])   . Hepatitis C 02/10/2013  . Lytic lesion of bone on x-ray 02/07/2013  . Pericardial effusion 02/07/2013  . UTI (urinary tract infection) 02/07/2013   Family History    Problem Relation Age of Onset  . Diabetes type II Mother   . Diabetes type II Father   . Alcoholism Father   . Cirrhosis Father   . Colon cancer Neg Hx   . Throat cancer Neg Hx   . Heart disease Neg Hx   . Kidney disease Neg Hx    Social Hx: daily smoker.Current medications reviewed.   ROS: Per HPI  Objective: Office vital signs reviewed. BP (!) 140/96   Pulse 86   Temp 97.9 F (36.6 C) (Oral)   Ht 5\' 8"  (1.727 m)   Wt 155 lb (70.3 kg)   BMI 23.57 kg/m   Physical Examination:  General: Awake, alert, thin male, No acute distress Cardio: regular rate and rhythm, S1S2 heard, no murmurs appreciated Pulm: Globally decreased breath sounds with prolonged expiratory phase, no wheezes, rhonchi or rales; normal work of breathing on room air Skin: bilateral temporal lesions appear less inflamed and erythematous compared to previous exam. Size is stable. No exudate, bleeding. Continues to be slightly tender but per patient improved.  Biopsy results from [facility]: 11/29/2016 Marc Wilson , MD PATIENT NAME:Wilson, Marc SURGICAL PATHOLOGY REPORT  FINAL PATHOLOGIC DIAGNOSIS MICROSCOPIC EXAMINATION AND DIAGNOSIS  A. SKIN, LEFT FOREHEAD, BIOPSY: Seborrheic keratosis, pigmented.  B. SKIN, LEFT TEMPLE, BIOPSY: Scar. Deeper sections have been cut.  C. SKIN, RIGHT TEMPLE, BIOPSY: Surface of a follicular cyst, infundibular type (epidermoid cyst). Deeper sections have been cut.  D. SKIN, RIGHT MALAR CHEEK, BIOPSY: Surface of a **Note De-Identified Filler Obfuscation** follicular cyst, infundibular type (epidermoid cyst). Deeper sections have been cut.  E. SKIN, RIGHT UPPER CHEST, BIOPSY: Scar and postinflammatory pigment alteration. Deeper sections have been cut.   Report Prepared XB:[id number] Wilson , MD  I have personally reviewed the slides and/or other related materials referenced, and have edited  the report as part of my pathologic assessment and final interpretation.  Electronically Signed Out By: Marc Wilson , MD 12/05/2016 13:46:14  ops/ca  Specimen(s) Received A: Left forehead B: Left temple C: Right Temple D: right malar cheek E: Right upper chest  Clinical History Multiple skin cancers: Right temple, right malar cheek, right upper chest, left temple all BCC vs SCC. Left forehead pigmented lesion rule out melanoma  Gross Description A. Received labeled "left forehead" is a 0.5 x 0.4 x 0.1 cm tan-brown skin shave biopsy specimen. The specimen is bisected and entirely submitted in A1.  B. Received labeled "left temple" is a 0.4 x 0.4 x 0.1 cm tan white skin shave biopsy specimen. The specimen is entirely submitted in B1.  C. Received labeled "left temple" is a 0.4 x 0.2 x 0.1 cm tan-white skin shave biopsy specimen. The specimen is entirely submitted in C1.  D. Received labeled "right malar cheek" is a 0.4 x 0.3 x 0.1 cm light brown and the skin shave biopsy specimen. The specimen is entirely submitted in D1.  E. Received labeled "right upper chest" is a 0.4 x 0.2 x 0.1 cm light brown skin shave biopsy specimen. The specimen is bisected and entirely submitted in E1.  Marc Wilson , MD/dy Copy To: Marc Wilson , MD  Assessment/ Plan: 61 y.o. male   COPD (chronic obstructive pulmonary disease) with emphysema (HCC) COPD exacerbation resolved after oral antibiotics and oral steroids. I reviewed again his CT scan and person with him today. He has been using Symbicort but per new guidelines, we will treat LABA/LAMA. Today he scored mMRC of 2, which would put him as a GOLD stage B. I transitioned him over to [facility] inhaler. Patient was able to demonstrate proper use of this today in office. He was given a 1 week sample. I have prescribed him a year supply of this which was sent to his pharmacy. He is  also given a one-month pretrial coupon as well as a co-pay assist coupon today. Instructions for these were provided and reviewed. Additionally, I prescribed him a rescue inhaler. We discussed proper use of albuterol and patient voiced good understanding. Teach back method was performed today. Would consider baseline spirometry now the patient has insurance at next appointment. Patient will follow-up in 3 months for recheck of COPD and blood pressure or sooner if needed.  Concern about skin cancer without diagnosis Patient's lesions look to be less inflamed on exam today. He is expecting to follow-up with dermatology for further review of his biopsy results. I did review them in care everywhere and from what I can tell, there is nothing to suggest malignancy on biopsy result. I did reinforce that he should follow up with the dermatologist for further discussion regarding these biopsy results. Patient voiced good understanding and is awaiting follow-up appointment.  Tobacco use disorder, severe, dependence He is in the action phase of sensation. We did have a good talk regarding his tobacco use and its impact on his emphysema. I also reiterated the increase overall risk of cancers related to tobacco use. He voiced good understanding.   Meds ordered this encounter  Medications  . umeclidinium-vilanterol [facility] **Note De-Identified Curt Obfuscation** ELLIPTA) 62.5-25 MCG/INH AEPB    Sig: Inhale 1 puff into the lungs daily.    Dispense:  1 each    Refill:  0  . umeclidinium-vilanterol (ANORO ELLIPTA) 62.5-25 MCG/INH AEPB    Sig: Inhale 1 puff into the lungs daily. **Every day inhaler**    Dispense:  1 each    Refill:  12  . albuterol (PROVENTIL HFA;VENTOLIN HFA) 108 (90 Base) MCG/ACT inhaler    Sig: Inhale 2 puffs into the lungs every 6 (six) hours as needed for wheezing or shortness of breath. **Rescue/ Emergency inhaler    Dispense:  1 Inhaler    Refill:  2   Additionally, FMLA and short-term disability forms were completed today.  Patient may return to work without restrictions.  Marc Norlander, DO [place] [phone]

## 2016-12-15 ENCOUNTER — Ambulatory Visit (INDEPENDENT_AMBULATORY_CARE_PROVIDER_SITE_OTHER): Payer: BLUE CROSS/BLUE SHIELD | Admitting: Family Medicine

## 2016-12-15 ENCOUNTER — Encounter: Payer: Self-pay | Admitting: Family Medicine

## 2016-12-15 VITALS — BP 140/96 | HR 86 | Temp 97.9°F | Ht 68.0 in | Wt 155.0 lb

## 2016-12-15 DIAGNOSIS — F17200 Nicotine dependence, unspecified, uncomplicated: Secondary | ICD-10-CM

## 2016-12-15 DIAGNOSIS — Z711 Person with feared health complaint in whom no diagnosis is made: Secondary | ICD-10-CM | POA: Diagnosis not present

## 2016-12-15 DIAGNOSIS — J432 Centrilobular emphysema: Secondary | ICD-10-CM | POA: Diagnosis not present

## 2016-12-15 DIAGNOSIS — Z0289 Encounter for other administrative examinations: Secondary | ICD-10-CM

## 2016-12-15 MED ORDER — ALBUTEROL SULFATE HFA 108 (90 BASE) MCG/ACT IN AERS: 2.0000 | INHALATION_SPRAY | Freq: Four times a day (QID) | RESPIRATORY_TRACT | 2 refills | Status: DC | PRN

## 2016-12-15 MED ORDER — UMECLIDINIUM-VILANTEROL 62.5-25 MCG/INH IN AEPB: 1.0000 | INHALATION_SPRAY | Freq: Every day | RESPIRATORY_TRACT | 12 refills | Status: DC

## 2016-12-15 MED ORDER — UMECLIDINIUM-VILANTEROL 62.5-25 MCG/INH IN AEPB: 1.0000 | INHALATION_SPRAY | Freq: Every day | RESPIRATORY_TRACT | 0 refills | Status: DC

## 2016-12-15 NOTE — Patient Instructions (Addendum)
**Note De-Identified Escandon Obfuscation** As we discussed, your CAT scan did not show any cancer in your chest. Though it did show changes consistent with COPD/emphysema. For this reason, you were started on the Symbicort inhaler. I would actually like you to stop the Symbicort inhaler now that your COPD exacerbation has improved. We will be starting Anoro inhaler for an maintenance and reduction of future COPD exacerbations.  Anoro inhaler instructions: Take 1 puff 1 time a day. (every day inhaler) Albuterol inhaler instruction: Take 2 puffs every 6 hours as needed for shortness of breath/ wheeze (rescue inhaler)  I have given you a week sample, a coupon for a full month supply for free, and a coupon that will make future refills on this much cheaper. Please call the number that I have highlighted to get this discount program started. Stopping smoking will greatly reduced your shortness of breath and cough. Follow up with Dr. Wendi Snipes in 3 months for COPD.   Chronic Obstructive Pulmonary Disease Chronic obstructive pulmonary disease (COPD) is a long-term (chronic) lung problem. When you have COPD, it is hard for air to get in and out of your lungs. The way your lungs work will never return to normal. Usually the condition gets worse over time. There are things you can do to keep yourself as healthy as possible. Your doctor may treat your condition with:  Medicines.  Quitting smoking, if you smoke.  Rehabilitation. This may involve a team of specialists.  Oxygen.  Exercise and changes to your diet.  Lung surgery.  Comfort measures (palliative care).  Follow these instructions at home: Medicines  Take over-the-counter and prescription medicines only as told by your doctor.  Talk to your doctor before taking any cough or allergy medicines. You may need to avoid medicines that cause your lungs to be dry. Lifestyle  If you smoke, stop. Smoking makes the problem worse. If you need help quitting, ask your doctor.  Avoid being  around things that make your breathing worse. This may include smoke, chemicals, and fumes.  Stay active, but remember to also rest.  Learn and use tips on how to relax.  Make sure you get enough sleep. Most adults need at least 7 hours a night.  Eat healthy foods. Eat smaller meals more often. Rest before meals. Controlled breathing  Learn and use tips on how to control your breathing as told by your doctor. Try: ? Breathing in (inhaling) through your nose for 1 second. Then, pucker your lips and breath out (exhale) through your lips for 2 seconds. ? Putting one hand on your belly (abdomen). Breathe in slowly through your nose for 1 second. Your hand on your belly should move out. Pucker your lips and breathe out slowly through your lips. Your hand on your belly should move in as you breathe out. Controlled coughing  Learn and use controlled coughing to clear mucus from your lungs. The steps are: 1. Lean your head a little forward. 2. Breathe in deeply. 3. Try to hold your breath for 3 seconds. 4. Keep your mouth slightly open while coughing 2 times. 5. Spit any mucus out into a tissue. 6. Rest and do the steps again 1 or 2 times as needed. General instructions  Make sure you get all the shots (vaccines) that your doctor recommends. Ask your doctor about a flu shot and a pneumonia shot.  Use oxygen therapy and therapy to help improve your lungs (pulmonary rehabilitation) if told by your doctor. If you need home oxygen therapy, ask **Note De-Identified Lyter Obfuscation** your doctor if you should buy a tool to measure your oxygen level (oximeter).  Make a COPD action plan with your doctor. This helps you know what to do if you feel worse than usual.  Manage any other conditions you have as told by your doctor.  Avoid going outside when it is very hot, cold, or humid.  Avoid people who have a sickness you can catch (contagious).  Keep all follow-up visits as told by your doctor. This is important. Contact a doctor  if:  You cough up more mucus than usual.  There is a change in the color or thickness of the mucus.  It is harder to breathe than usual.  Your breathing is faster than usual.  You have trouble sleeping.  You need to use your medicines more often than usual.  You have trouble doing your normal activities such as getting dressed or walking around the house. Get help right away if:  You have shortness of breath while resting.  You have shortness of breath that stops you from: ? Being able to talk. ? Doing normal activities.  Your chest hurts for longer than 5 minutes.  Your skin color is more blue than usual.  Your pulse oximeter shows that you have low oxygen for longer than 5 minutes.  You have a fever.  You feel too tired to breathe normally. Summary  Chronic obstructive pulmonary disease (COPD) is a long-term lung problem.  The way your lungs work will never return to normal. Usually the condition gets worse over time. There are things you can do to keep yourself as healthy as possible.  Take over-the-counter and prescription medicines only as told by your doctor.  If you smoke, stop. Smoking makes the problem worse. This information is not intended to replace advice given to you by your health care provider. Make sure you discuss any questions you have with your health care provider. Document Released: 09/06/2007 Document Revised: 08/26/2015 Document Reviewed: 11/14/2012 Elsevier Interactive Patient Education  2017 [employer].

## 2016-12-15 NOTE — Assessment & Plan Note (Signed)
**Note De-Identified Sargent Obfuscation** COPD exacerbation resolved after oral antibiotics and oral steroids. I reviewed again his CT scan and person with him today. He has been using Symbicort but per new guidelines, we will treat LABA/LAMA. Today he scored mMRC of 2, which would put him as a GOLD stage B. I transitioned him over to [facility] inhaler. Patient was able to demonstrate proper use of this today in office. He was given a 1 week sample. I have prescribed him a year supply of this which was sent to his pharmacy. He is also given a one-month pretrial coupon as well as a co-pay assist coupon today. Instructions for these were provided and reviewed. Additionally, I prescribed him a rescue inhaler. We discussed proper use of albuterol and patient voiced good understanding. Teach back method was performed today. Would consider baseline spirometry now the patient has insurance at next appointment. Patient will follow-up in 3 months for recheck of COPD and blood pressure or sooner if needed.

## 2016-12-15 NOTE — Assessment & Plan Note (Signed)
**Note De-Identified Floresca Obfuscation** He is in the action phase of sensation. We did have a good talk regarding his tobacco use and its impact on his emphysema. I also reiterated the increase overall risk of cancers related to tobacco use. He voiced good understanding.

## 2016-12-15 NOTE — Assessment & Plan Note (Signed)
**Note De-Identified D[place]on Obfuscation** Patient's lesions look to be less inflamed on exam today. He is expecting to follow-up with dermatology for further review of his biopsy results. I did review them in care everywhere and from what I can tell, there is nothing to suggest malignancy on biopsy result. I did reinforce that he should follow up with the dermatologist for further discussion regarding these biopsy results. Patient voiced good understanding and is awaiting follow-up appointment.

## 2016-12-18 ENCOUNTER — Telehealth: Payer: Self-pay | Admitting: Family Medicine

## 2016-12-18 NOTE — Telephone Encounter (Signed)
**Note De-identified Sigala Obfuscation** Please advise 

## 2016-12-19 ENCOUNTER — Encounter: Payer: Self-pay | Admitting: Family Medicine

## 2016-12-19 NOTE — Telephone Encounter (Signed)
**Note De-identified Trampe Obfuscation** Done

## 2016-12-20 ENCOUNTER — Telehealth: Payer: Self-pay | Admitting: Family Medicine

## 2016-12-20 NOTE — Telephone Encounter (Signed)
**Note De-identified Aderman Obfuscation** Letter faxed.

## 2017-03-16 ENCOUNTER — Ambulatory Visit: Payer: BLUE CROSS/BLUE SHIELD | Admitting: Family Medicine

## 2017-03-17 ENCOUNTER — Encounter: Payer: Self-pay | Admitting: Family Medicine

## 2017-06-27 ENCOUNTER — Telehealth: Payer: Self-pay | Admitting: Family Medicine

## 2017-06-27 NOTE — Telephone Encounter (Signed)
**Note De-Identified Brandis Obfuscation** Patient aware, he has refills of ANORA.

## 2017-08-20 NOTE — Progress Notes (Signed)
**Note De-Identified [place] Obfuscation** Subjective: CC: "bathroom problems" PCP: Janora Norlander, DO UYQ:[id number] Marc Wilson is a 62 y.o. male presenting to clinic today for:  1. Diarrhea/ Etoh use Patient reports that he had onset of diarrhea 1 month ago.  He notes that he has had explosive episodes in the middle the day at work even after attempting to avoid.  He reports occasional bright red blood in the stool.  Last episode was 5 days ago.  He is unable to quantify how many times a day he is stooling but notes that it can occur at anytime of the day.  Denies any associated abdominal pain or cramping.  No nausea, vomiting, fever.  No consumption of untreated water, undercooked foods or foods left out too long.  No recent travel or new pets.  2. HTN/ Tobacco use Patient denies chest pain.  He reports that shortness of breath is stable on the Anoro.  He continues to smoke but notes that he has reduced smoking to 1 pack/day from 2 packs/day.  Denies excessive salt consumption.  No lower extremity edema, visual disturbance or dizziness.  ROS: Per HPI  No Known Allergies Past Medical History:  Diagnosis Date  . Acute renal failure ([place]) 02/07/2013  . Alcoholism ([place])   . Hepatitis C 02/10/2013  . Lytic lesion of bone on x-ray 02/07/2013  . Pericardial effusion 02/07/2013  . UTI (urinary tract infection) 02/07/2013    Current Outpatient Medications:  .  albuterol (PROVENTIL HFA;VENTOLIN HFA) 108 (90 Base) MCG/ACT inhaler, Inhale 2 puffs into the lungs every 6 (six) hours as needed for wheezing or shortness of breath. **Rescue/ Emergency inhaler, Disp: 1 Inhaler, Rfl: 2 .  umeclidinium-vilanterol (ANORO ELLIPTA) 62.5-25 MCG/INH AEPB, Inhale 1 puff into the lungs daily., Disp: 1 each, Rfl: 0 .  umeclidinium-vilanterol (ANORO ELLIPTA) 62.5-25 MCG/INH AEPB, Inhale 1 puff into the lungs daily. **Every day inhaler**, Disp: 1 each, Rfl: 12 Social History   Socioeconomic History  . Marital status: Legally Separated    Spouse name: Not on  file  . Number of children: Not on file  . Years of education: Not on file  . Highest education level: Not on file  Occupational History  . Not on file  Social Needs  . Financial resource strain: Not on file  . Food insecurity:    Worry: Not on file    Inability: Not on file  . Transportation needs:    Medical: Not on file    Non-medical: Not on file  Tobacco Use  . Smoking status: Current Every Day Smoker    Packs/day: 2.00    Types: Cigarettes    Last attempt to quit: 02/06/2013    Years since quitting: 4.5  . Smokeless tobacco: Former Network engineer and Sexual Activity  . Alcohol use: Yes    Alcohol/week: 7.2 oz    Types: 12 Cans of beer per week    Comment: 12 beers daily x 40 years--- as of 02-18-13, has 2 beers daily.  . Drug use: No    Types: Marijuana    Comment: has d/c as of 01/2013  . Sexual activity: Not on file  Lifestyle  . Physical activity:    Days per week: Not on file    Minutes per session: Not on file  . Stress: Not on file  Relationships  . Social connections:    Talks on phone: Not on file    Gets together: Not on file    Attends religious service: Not on file **Note De-Identified Brander Obfuscation** Active member of club or organization: Not on file    Attends meetings of clubs or organizations: Not on file    Relationship status: Not on file  . Intimate partner violence:    Fear of current or ex partner: Not on file    Emotionally abused: Not on file    Physically abused: Not on file    Forced sexual activity: Not on file  Other Topics Concern  . Not on file  Social History Narrative  . Not on file   Family History  Problem Relation Age of Onset  . Diabetes type II Mother   . Diabetes type II Father   . Alcoholism Father   . Cirrhosis Father   . Colon cancer Neg Hx   . Throat cancer Neg Hx   . Heart disease Neg Hx   . Kidney disease Neg Hx     Objective: Office vital signs reviewed. BP (!) 175/102   Pulse 72   Temp 97.9 F (36.6 C) (Oral)   Ht 5\' 8"  (1.727 m)    Wt 164 lb (74.4 kg)   BMI 24.94 kg/m   Physical Examination:  General: Awake, alert, nontoxic, No acute distress HEENT: large skin lesions on bilateral temples concerning for skin cancer stable in appearance from previous    Throat: moist mucus membranes Cardio: regular rate and rhythm Pulm: normal work of breathing on room air GI: soft, non-tender, non-distended, bowel sounds present x4, +hepatomegaly, no masses Skin: as above; skin turgor fair  Assessment/ Plan: 62 y.o. male   1. Diarrhea, unspecified type Uncertain etiology.  No evidence of dehydration on today's exam.  He actually has gained weight since last visit. Stool studies were considered but given presence of blood in stool and need for colonoscopy, I have referred him to GI for further evaluation.  Check BMP and CBC today.  Push oral fluids.  Handout provided.  Home care instructions were reviewed.  Return precautions discussed.   - Basic Metabolic Panel - CBC with Differential - Ambulatory referral to Gastroenterology  2. Alcohol use disorder, moderate, dependence ([place]) Continues to drink on average 6 beers per day.  Hepatomegaly appreciated on exam.  He has known cirrhosis of the liver.  Counseling provided.  3. Essential hypertension New diagnosis for patient.  Norvasc 5 mg sent to pharmacy.  He is to follow-up in 2 weeks for blood pressure recheck with nurse.  4. Blood in stool See above.  No previous colonoscopy. - Ambulatory referral to Gastroenterology  5. Concern about skin cancer without diagnosis Per patient, he was called and told that he likely has skin cancer.  He never followed up.  I did encourage him to follow-up he notes that he has financial constraints.   Orders Placed This Encounter  Procedures  . Basic Metabolic Panel  . CBC with Differential  . Ambulatory referral to Gastroenterology    Referral Priority:   Routine    Referral Type:   Consultation    Referral Reason:   Specialty  Services Required    Number of Visits Requested:   1   Meds ordered this encounter  Medications  . amLODipine (NORVASC) 5 MG tablet    Sig: Take 1 tablet (5 mg total) by mouth daily.    Dispense:  30 tablet    Refill:  [place], DO [place] [phone]

## 2017-08-21 ENCOUNTER — Ambulatory Visit (INDEPENDENT_AMBULATORY_CARE_PROVIDER_SITE_OTHER): Payer: BLUE CROSS/BLUE SHIELD | Admitting: Family Medicine

## 2017-08-21 ENCOUNTER — Encounter: Payer: Self-pay | Admitting: Family Medicine

## 2017-08-21 VITALS — BP 175/102 | HR 72 | Temp 97.9°F | Ht 68.0 in | Wt 164.0 lb

## 2017-08-21 DIAGNOSIS — K921 Melena: Secondary | ICD-10-CM | POA: Diagnosis not present

## 2017-08-21 DIAGNOSIS — F1020 Alcohol dependence, uncomplicated: Secondary | ICD-10-CM | POA: Diagnosis not present

## 2017-08-21 DIAGNOSIS — Z711 Person with feared health complaint in whom no diagnosis is made: Secondary | ICD-10-CM | POA: Diagnosis not present

## 2017-08-21 DIAGNOSIS — R197 Diarrhea, unspecified: Secondary | ICD-10-CM | POA: Diagnosis not present

## 2017-08-21 DIAGNOSIS — I10 Essential (primary) hypertension: Secondary | ICD-10-CM | POA: Diagnosis not present

## 2017-08-21 MED ORDER — AMLODIPINE BESYLATE 5 MG PO TABS: 5.0000 mg | ORAL_TABLET | Freq: Every day | ORAL | 0 refills | Status: DC

## 2017-08-21 NOTE — Patient Instructions (Addendum)
**Note De-Identified [place] Obfuscation** I have referred you to the gastric specialist to help [place] with your diarrhea.  I have given you the remainder of the week off.  I encourage you to make sure that you keep your gastroenterology appointment.  I also started you on blood pressure medicines today.  Follow-up in 2 weeks for blood pressure recheck with the nurse.   Diarrhea, Adult Diarrhea is frequent loose and watery bowel movements. Diarrhea can make you feel weak and cause you to become dehydrated. Dehydration can make you tired and thirsty, cause you to have a dry mouth, and decrease how often you urinate. Diarrhea typically lasts 2-3 days. However, it can last longer if it is a sign of something more serious. It is important to treat your diarrhea as told by your health care provider. Follow these instructions at home: Eating and drinking  Follow these recommendations as told by your health care provider:  Take an oral rehydration solution (ORS). This is a drink that is sold at pharmacies and retail stores.  Drink clear fluids, such as water, ice chips, diluted fruit juice, and low-calorie sports drinks.  Eat bland, easy-to-digest foods in small amounts as you are able. These foods include bananas, applesauce, rice, lean meats, toast, and crackers.  Avoid drinking fluids that contain a lot of sugar or caffeine, such as energy drinks, sports drinks, and soda.  Avoid alcohol.  Avoid spicy or fatty foods.  General instructions  Drink enough fluid to keep your urine clear or pale yellow.  Wash your hands often. If soap and water are not available, use hand sanitizer.  Make sure that all people in your household wash their hands well and often.  Take over-the-counter and prescription medicines only as told by your health care provider.  Rest at home while you recover.  Watch your condition for any changes.  Take a warm bath to relieve any burning or pain from frequent diarrhea episodes.  Keep all follow-up visits as  told by your health care provider. This is important. Contact a health care provider if:  You have a fever.  Your diarrhea gets worse.  You have new symptoms.  You cannot keep fluids down.  You feel light-headed or dizzy.  You have a headache  You have muscle cramps. Get help right away if:  You have chest pain.  You feel extremely weak or you faint.  You have bloody or black stools or stools that look like tar.  You have severe pain, cramping, or bloating in your abdomen.  You have trouble breathing or you are breathing very quickly.  Your heart is beating very quickly.  Your skin feels cold and clammy.  You feel confused.  You have signs of dehydration, such as: ? Dark urine, very little urine, or no urine. ? Cracked lips. ? Dry mouth. ? Sunken eyes. ? Sleepiness. ? Weakness. This information is not intended to replace advice given to you by your health care provider. Make sure you discuss any questions you have with your health care provider. Document Released: 03/10/2002 Document Revised: 07/29/2015 Document Reviewed: 11/24/2014 Elsevier Interactive Patient Education  [employer].

## 2017-08-22 LAB — CBC WITH DIFFERENTIAL/PLATELET
BASOS ABS: 0.1 10*3/uL (ref 0.0–0.2)
Basos: 1 %
EOS (ABSOLUTE): 0.2 10*3/uL (ref 0.0–0.4)
EOS: 2 %
HEMATOCRIT: 47.9 % (ref 37.5–51.0)
HEMOGLOBIN: 16.9 g/dL (ref 13.0–17.7)
IMMATURE GRANULOCYTES: 1 %
Immature Grans (Abs): 0 10*3/uL (ref 0.0–0.1)
LYMPHS: 9 %
Lymphocytes Absolute: 0.7 10*3/uL (ref 0.7–3.1)
MCH: 32.1 pg (ref 26.6–33.0)
MCHC: 35.3 g/dL (ref 31.5–35.7)
MCV: 91 fL (ref 79–97)
MONOCYTES: 8 %
Monocytes Absolute: 0.6 10*3/uL (ref 0.1–0.9)
NEUTROS PCT: 79 %
Neutrophils Absolute: 6.5 10*3/uL (ref 1.4–7.0)
Platelets: 115 10*3/uL — ABNORMAL LOW (ref 150–450)
RBC: 5.27 x10E6/uL (ref 4.14–5.80)
RDW: 16.7 % — ABNORMAL HIGH (ref 12.3–15.4)
WBC: 8 10*3/uL (ref 3.4–10.8)

## 2017-08-22 LAB — BASIC METABOLIC PANEL
BUN/Creatinine Ratio: 11 (ref 9–20)
BUN: 10 mg/dL (ref 6–24)
CALCIUM: 9.5 mg/dL (ref 8.7–10.2)
CHLORIDE: 98 mmol/L (ref 96–106)
CO2: 20 mmol/L (ref 20–29)
Creatinine, Ser: 0.87 mg/dL (ref 0.76–1.27)
GFR calc Af Amer: 109 mL/min/{1.73_m2} (ref >59)
GFR calc non Af Amer: 94 mL/min/{1.73_m2} (ref >59)
GLUCOSE: 102 mg/dL — AB (ref 65–99)
Potassium: 4.3 mmol/L (ref 3.5–5.2)
Sodium: 134 mmol/L (ref 134–144)

## 2017-09-05 ENCOUNTER — Ambulatory Visit: Payer: BLUE CROSS/BLUE SHIELD | Admitting: *Deleted

## 2017-09-05 ENCOUNTER — Other Ambulatory Visit: Payer: Self-pay | Admitting: Family Medicine

## 2017-09-05 VITALS — BP 151/84 | HR 77

## 2017-09-05 DIAGNOSIS — Z0130 Encounter for examination of blood pressure without abnormal findings: Secondary | ICD-10-CM

## 2017-09-05 MED ORDER — AMLODIPINE BESYLATE 5 MG PO TABS: 10.0000 mg | ORAL_TABLET | Freq: Every day | ORAL | 0 refills | Status: DC

## 2017-09-05 NOTE — Progress Notes (Signed)
**Note De-Identified Mcl[place] Obfuscation** I reviewed his visit for blood pressure check with the nurse.  His blood pressure has gotten substantially better compared to previous but still considered elevated and not at goal.  Please have patient increase his Norvasc to 10 mg daily.  This will be 2 of the 5 mg tablets I prescribed him at last visit.  I would like him to follow-up with me in 2 weeks for blood pressure.  Meds ordered this encounter  Medications  . amLODipine (NORVASC) 5 MG tablet    Sig: Take 2 tablets (10 mg total) by mouth daily.    Dispense:  30 tablet    Refill:  0

## 2017-09-05 NOTE — Progress Notes (Signed)
**Note De-Identified Mackowski Obfuscation** Per Dr. Lajuana Ripple patient was informed to increase his Norvasc 5 mg to two tablets daily.  Also a 2 week follow up appointment was scheduled on 09/19/2017 at 8:00 am.

## 2017-09-05 NOTE — Progress Notes (Signed)
**Note De-Identified Struble Obfuscation** Pt here for BP ck BP 159 88 P 81  rck BP 151 84 P 77

## 2017-09-11 ENCOUNTER — Encounter: Payer: Self-pay | Admitting: Family Medicine

## 2017-09-11 ENCOUNTER — Ambulatory Visit (INDEPENDENT_AMBULATORY_CARE_PROVIDER_SITE_OTHER): Payer: BLUE CROSS/BLUE SHIELD | Admitting: Family Medicine

## 2017-09-11 VITALS — BP 149/92 | HR 86 | Temp 97.8°F | Ht 68.0 in | Wt 157.0 lb

## 2017-09-11 DIAGNOSIS — R197 Diarrhea, unspecified: Secondary | ICD-10-CM | POA: Diagnosis not present

## 2017-09-11 DIAGNOSIS — I10 Essential (primary) hypertension: Secondary | ICD-10-CM

## 2017-09-11 DIAGNOSIS — R634 Abnormal weight loss: Secondary | ICD-10-CM

## 2017-09-11 DIAGNOSIS — K7030 Alcoholic cirrhosis of liver without ascites: Secondary | ICD-10-CM

## 2017-09-11 MED ORDER — HYDROCHLOROTHIAZIDE 12.5 MG PO TABS: 12.5000 mg | ORAL_TABLET | Freq: Every day | ORAL | 1 refills | Status: DC

## 2017-09-11 MED ORDER — AMLODIPINE BESYLATE 10 MG PO TABS: 10.0000 mg | ORAL_TABLET | Freq: Every day | ORAL | 3 refills | Status: DC

## 2017-09-11 NOTE — Progress Notes (Signed)
**Note De-Identified [place] Obfuscation** Subjective: CC: HTN PCP: Janora Norlander, DO IOX:[id number] Marc Wilson is a 62 y.o. male presenting to clinic today for:  1. Hypertension Patient reports compliance with Norvasc 10mg , Side effects: mild LE edema.  He continues to smoke and drink ETOH. Denies headache, dizziness, visual changes, nausea, vomiting, chest pain, abdominal pain or shortness of breath.  2. Diarrhea Patient has had nonbloody diarrhea for almost 2 months now.  He notes he continues to have intermittent explosive episodes in the middle the day at work.  He denies any blood in the stool now but last office visit he did report occasional bright red blood.  Denies associated nausea, vomiting, fevers, night sweats.  He does note some abdominal cramping.  He continues to eat and drink normally.  He does, of note, consume 5-6 beers daily.  Denies any consumption of untreated water or undercooked foods.  No recent travel.  Past medical history is significant for alcoholic cirrhosis of the liver.    ROS: Per HPI  No Known Allergies Past Medical History:  Diagnosis Date  . Acute renal failure ([place]) 02/07/2013  . Alcoholism ([place])   . Hepatitis C 02/10/2013  . Lytic lesion of bone on x-ray 02/07/2013  . Pericardial effusion 02/07/2013  . UTI (urinary tract infection) 02/07/2013    Current Outpatient Medications:  .  albuterol (PROVENTIL HFA;VENTOLIN HFA) 108 (90 Base) MCG/ACT inhaler, Inhale 2 puffs into the lungs every 6 (six) hours as needed for wheezing or shortness of breath. **Rescue/ Emergency inhaler, Disp: 1 Inhaler, Rfl: 2 .  amLODipine (NORVASC) 5 MG tablet, Take 2 tablets (10 mg total) by mouth daily., Disp: 30 tablet, Rfl: 0 .  umeclidinium-vilanterol (ANORO ELLIPTA) 62.5-25 MCG/INH AEPB, Inhale 1 puff into the lungs daily. **Every day inhaler**, Disp: 1 each, Rfl: 12 Social History   Socioeconomic History  . Marital status: Legally Separated    Spouse name: Not on file  . Number of children: Not on file  . Years  of education: Not on file  . Highest education level: Not on file  Occupational History  . Not on file  Social Needs  . Financial resource strain: Not on file  . Food insecurity:    Worry: Not on file    Inability: Not on file  . Transportation needs:    Medical: Not on file    Non-medical: Not on file  Tobacco Use  . Smoking status: Current Every Day Smoker    Packs/day: 2.00    Types: Cigarettes    Last attempt to quit: 02/06/2013    Years since quitting: 4.5  . Smokeless tobacco: Former Network engineer and Sexual Activity  . Alcohol use: Yes    Alcohol/week: 7.2 oz    Types: 12 Cans of beer per week    Comment: 12 beers daily x 40 years--- as of 02-18-13, has 2 beers daily.  . Drug use: No    Types: Marijuana    Comment: has d/c as of 01/2013  . Sexual activity: Not on file  Lifestyle  . Physical activity:    Days per week: Not on file    Minutes per session: Not on file  . Stress: Not on file  Relationships  . Social connections:    Talks on phone: Not on file    Gets together: Not on file    Attends religious service: Not on file    Active member of club or organization: Not on file    Attends meetings of clubs **Note De-Identified Gittleman Obfuscation** or organizations: Not on file    Relationship status: Not on file  . Intimate partner violence:    Fear of current or ex partner: Not on file    Emotionally abused: Not on file    Physically abused: Not on file    Forced sexual activity: Not on file  Other Topics Concern  . Not on file  Social History Narrative  . Not on file   Family History  Problem Relation Age of Onset  . Diabetes type II Mother   . Diabetes type II Father   . Alcoholism Father   . Cirrhosis Father   . Colon cancer Neg Hx   . Throat cancer Neg Hx   . Heart disease Neg Hx   . Kidney disease Neg Hx     Objective: Office vital signs reviewed. BP (!) 149/92   Pulse 86   Temp 97.8 F (36.6 C) (Oral)   Ht 5\' 8"  (1.727 m)   Wt 157 lb (71.2 kg)   BMI 23.87 kg/m    Physical Examination:  General: Awake, alert, chronically ill appearing male, No acute distress HEENT: large poorly healing bitemporal lesions.     Eyes: PERRLA, extraocular membranes intact, sclera white    Throat: moist mucus membranes Cardio: regular rate and rhythm, S1S2 heard, no murmurs appreciated Pulm: clear to auscultation bilaterally, no wheezes, rhonchi or rales; normal work of breathing on room air GI: soft, non-tender, non-distended, bowel sounds present x4, +hepatomegaly, no masses Extremities: warm, well perfused, trace pedal edema, cyanosis or clubbing; +2 pulses bilaterally MSK: normal gait and normal station Skin: bitemporal lesions as above  Assessment/ Plan: 62 y.o. male   1. Essential hypertension Blood pressure not at goal.  Continue Norvasc 10 mg.  Add HCTZ 12.5 mg daily.  Smoking cessation recommended.  Patient is contemplative.  Follow-up in 4 weeks for recheck.  2. Alcoholic cirrhosis of liver without ascites (HCC) Hepatomegaly appreciated on exam.  Patient continues to drink 5-6 beers per day. - Ambulatory referral to Gastroenterology  3. Weight loss, non-intentional Possibly related to malabsorption from diarrhea versus skin lesions concerning for skin cancer.  Will send to gastroenterology for further evaluation of persistent diarrheal episodes.  I actually referred him at last visit but his referral was declined by [facility], despite patient requesting to establish locally, as he is established with [facility] previously.  I have placed this referral again but this time to [place]. - Ambulatory referral to Gastroenterology  4. Diarrhea, unspecified type - Ambulatory referral to Gastroenterology   Orders Placed This Encounter  Procedures  . Ambulatory referral to Gastroenterology    Referral Priority:   Routine    Referral Type:   Consultation    Referral Reason:   Specialty Services Required    Number of Visits Requested:   1   Meds ordered this  encounter  Medications  . amLODipine (NORVASC) 10 MG tablet    Sig: Take 1 tablet (10 mg total) by mouth daily.    Dispense:  90 tablet    Refill:  3  . hydrochlorothiazide (HYDRODIURIL) 12.5 MG tablet    Sig: Take 1 tablet (12.5 mg total) by mouth daily.    Dispense:  30 tablet    Refill:  [place], DO [place] [phone]

## 2017-09-11 NOTE — Patient Instructions (Signed)
**Note De-Identified [place]o Obfuscation** I have referred you to the gastric specialist for evaluation of your weight loss and diarrhea.  You will be contacted with an appointment.   Diarrhea, Adult Diarrhea is when you have loose and water poop (stool) often. Diarrhea can make you feel weak and cause you to get dehydrated. Dehydration can make you tired and thirsty, make you have a dry mouth, and make it so you pee (urinate) less often. Diarrhea often lasts 2-3 days. However, it can last longer if it is a sign of something more serious. It is important to treat your diarrhea as told by your doctor. Follow these instructions at home: Eating and drinking  Follow these recommendations as told by your doctor:  Take an oral rehydration solution (ORS). This is a drink that is sold at pharmacies and stores.  Drink clear fluids, such as: ? Water. ? Ice chips. ? Diluted fruit juice. ? Low-calorie sports drinks.  Eat bland, easy-to-digest foods in small amounts as you are able. These foods include: ? Bananas. ? Applesauce. ? Rice. ? Low-fat (lean) meats. ? Toast. ? Crackers.  Avoid drinking fluids that have a lot of sugar or caffeine in them.  Avoid alcohol.  Avoid spicy or fatty foods.  General instructions   Drink enough fluid to keep your pee (urine) clear or pale yellow.  Wash your hands often. If you cannot use soap and water, use hand sanitizer.  Make sure that all people in your home wash their hands well and often.  Take over-the-counter and prescription medicines only as told by your doctor.  Rest at home while you get better.  Watch your condition for any changes.  Take a warm bath to help with any burning or pain from having diarrhea.  Keep all follow-up visits as told by your doctor. This is important. Contact a doctor if:  You have a fever.  Your diarrhea gets worse.  You have new symptoms.  You cannot keep fluids down.  You feel light-headed or dizzy.  You have a headache.  You have muscle  cramps. Get help right away if:  You have chest pain.  You feel very weak or you pass out (faint).  You have bloody or black poop or poop that look like tar.  You have very bad pain, cramping, or bloating in your belly (abdomen).  You have trouble breathing or you are breathing very quickly.  Your heart is beating very quickly.  Your skin feels cold and clammy.  You feel confused.  You have signs of dehydration, such as: ? Dark pee, hardly any pee, or no pee. ? Cracked lips. ? Dry mouth. ? Sunken eyes. ? Sleepiness. ? Weakness. This information is not intended to replace advice given to you by your health care provider. Make sure you discuss any questions you have with your health care provider. Document Released: 09/06/2007 Document Revised: 10/08/2015 Document Reviewed: 11/24/2014 Elsevier Interactive Patient Education  2018 [employer].

## 2017-09-19 ENCOUNTER — Ambulatory Visit: Payer: BLUE CROSS/BLUE SHIELD | Admitting: Family Medicine

## 2017-10-14 ENCOUNTER — Other Ambulatory Visit: Payer: Self-pay

## 2017-10-14 ENCOUNTER — Emergency Department (HOSPITAL_COMMUNITY): Payer: Medicaid Other

## 2017-10-14 ENCOUNTER — Encounter (HOSPITAL_COMMUNITY): Payer: Self-pay | Admitting: Emergency Medicine

## 2017-10-14 ENCOUNTER — Emergency Department (HOSPITAL_COMMUNITY)
Admission: EM | Admit: 2017-10-14 | Discharge: 2017-10-14 | Disposition: A | Discharge status: Home / Self Care | Payer: Medicaid Other | Attending: Emergency Medicine | Admitting: Emergency Medicine

## 2017-10-14 DIAGNOSIS — F1020 Alcohol dependence, uncomplicated: Secondary | ICD-10-CM | POA: Diagnosis not present

## 2017-10-14 DIAGNOSIS — C220 Liver cell carcinoma: Secondary | ICD-10-CM

## 2017-10-14 DIAGNOSIS — R180 Malignant ascites: Secondary | ICD-10-CM | POA: Insufficient documentation

## 2017-10-14 DIAGNOSIS — C7800 Secondary malignant neoplasm of unspecified lung: Secondary | ICD-10-CM

## 2017-10-14 DIAGNOSIS — Z79899 Other long term (current) drug therapy: Secondary | ICD-10-CM | POA: Insufficient documentation

## 2017-10-14 DIAGNOSIS — R1900 Intra-abdominal and pelvic swelling, mass and lump, unspecified site: Secondary | ICD-10-CM | POA: Diagnosis present

## 2017-10-14 DIAGNOSIS — I871 Compression of vein: Secondary | ICD-10-CM | POA: Insufficient documentation

## 2017-10-14 DIAGNOSIS — F17210 Nicotine dependence, cigarettes, uncomplicated: Secondary | ICD-10-CM | POA: Diagnosis not present

## 2017-10-14 DIAGNOSIS — J449 Chronic obstructive pulmonary disease, unspecified: Secondary | ICD-10-CM | POA: Diagnosis not present

## 2017-10-14 LAB — COMPREHENSIVE METABOLIC PANEL
ALT: 48 U/L — AB (ref 0–44)
AST: 363 U/L — AB (ref 15–41)
Albumin: 3 g/dL — ABNORMAL LOW (ref 3.5–5.0)
Alkaline Phosphatase: 132 U/L — ABNORMAL HIGH (ref 38–126)
Anion gap: 11 (ref 5–15)
BUN: 12 mg/dL (ref 6–20)
CHLORIDE: 98 mmol/L (ref 98–111)
CO2: 26 mmol/L (ref 22–32)
CREATININE: 0.85 mg/dL (ref 0.61–1.24)
Calcium: 8.8 mg/dL — ABNORMAL LOW (ref 8.9–10.3)
GFR calc non Af Amer: >60 mL/min (ref >60)
Glucose, Bld: 96 mg/dL (ref 70–99)
POTASSIUM: 3.3 mmol/L — AB (ref 3.5–5.1)
Sodium: 135 mmol/L (ref 135–145)
Total Bilirubin: 2.2 mg/dL — ABNORMAL HIGH (ref 0.3–1.2)
Total Protein: 7.5 g/dL (ref 6.5–8.1)

## 2017-10-14 LAB — ALBUMIN, PLEURAL OR PERITONEAL FLUID: Albumin, Fluid: 1 g/dL

## 2017-10-14 LAB — BODY FLUID CELL COUNT WITH DIFFERENTIAL
Eos, Fluid: 1 %
LYMPHS FL: 64 %
Monocyte-Macrophage-Serous Fluid: 23 % — ABNORMAL LOW (ref 50–90)
Neutrophil Count, Fluid: 12 % (ref 0–25)
WBC FLUID: 82 uL (ref 0–1000)

## 2017-10-14 LAB — CBC WITH DIFFERENTIAL/PLATELET
BASOS ABS: 0 10*3/uL (ref 0.0–0.1)
Basophils Relative: 1 %
EOS ABS: 0.1 10*3/uL (ref 0.0–0.7)
EOS PCT: 1 %
HCT: 48.2 % (ref 39.0–52.0)
Hemoglobin: 16.3 g/dL (ref 13.0–17.0)
LYMPHS ABS: 1 10*3/uL (ref 0.7–4.0)
Lymphocytes Relative: 12 %
MCH: 31.2 pg (ref 26.0–34.0)
MCHC: 33.8 g/dL (ref 30.0–36.0)
MCV: 92.3 fL (ref 78.0–100.0)
MONOS PCT: 8 %
Monocytes Absolute: 0.7 10*3/uL (ref 0.1–1.0)
NEUTROS PCT: 78 %
Neutro Abs: 6.7 10*3/uL (ref 1.7–7.7)
PLATELETS: 131 10*3/uL — AB (ref 150–400)
RBC: 5.22 MIL/uL (ref 4.22–5.81)
RDW: 19.9 % — AB (ref 11.5–15.5)
WBC: 8.5 10*3/uL (ref 4.0–10.5)

## 2017-10-14 LAB — GRAM STAIN: GRAM STAIN: NONE SEEN

## 2017-10-14 LAB — PROTIME-INR
INR: 1.21
Prothrombin Time: 15.2 seconds (ref 11.4–15.2)

## 2017-10-14 LAB — LACTATE DEHYDROGENASE, PLEURAL OR PERITONEAL FLUID: LD FL: 56 U/L — AB (ref 3–23)

## 2017-10-14 LAB — ETHANOL: Alcohol, Ethyl (B): <10 mg/dL (ref <10)

## 2017-10-14 LAB — GLUCOSE, PLEURAL OR PERITONEAL FLUID: GLUCOSE FL: 107 mg/dL

## 2017-10-14 LAB — PROTEIN, PLEURAL OR PERITONEAL FLUID: Total protein, fluid: <3 g/dL

## 2017-10-14 LAB — AMMONIA: Ammonia: 48 umol/L — ABNORMAL HIGH (ref 9–35)

## 2017-10-14 LAB — APTT: aPTT: 37 seconds — ABNORMAL HIGH (ref 24–36)

## 2017-10-14 MED ORDER — WARFARIN SODIUM 5 MG PO TABS
5.0000 mg | ORAL_TABLET | ORAL | Status: AC
  Administered 2017-10-14: 5 mg via ORAL
  Filled 2017-10-14: qty 1

## 2017-10-14 MED ORDER — ENOXAPARIN SODIUM 80 MG/0.8ML ~~LOC~~ SOLN
1.0000 mg/kg | Freq: Once | SUBCUTANEOUS | Status: AC
  Administered 2017-10-14: 70 mg via SUBCUTANEOUS
  Filled 2017-10-14: qty 0.8

## 2017-10-14 MED ORDER — ENOXAPARIN SODIUM 150 MG/ML ~~LOC~~ SOLN: 1.0000 mg/kg | Freq: Two times a day (BID) | SUBCUTANEOUS | 0 refills | Status: DC

## 2017-10-14 MED ORDER — POVIDONE-IODINE 10 % EX SOLN
CUTANEOUS | Status: AC
  Filled 2017-10-14: qty 15

## 2017-10-14 MED ORDER — IOHEXOL 300 MG/ML  SOLN
75.0000 mL | Freq: Once | INTRAMUSCULAR | Status: AC | PRN
  Administered 2017-10-14: 75 mL via INTRAVENOUS

## 2017-10-14 MED ORDER — WARFARIN SODIUM 5 MG PO TABS: 5.0000 mg | ORAL_TABLET | Freq: Every day | ORAL | 0 refills | Status: DC

## 2017-10-14 MED ORDER — SULFAMETHOXAZOLE-TRIMETHOPRIM 800-160 MG PO TABS
1.0000 | ORAL_TABLET | Freq: Once | ORAL | Status: AC
  Administered 2017-10-14: 1 via ORAL
  Filled 2017-10-14: qty 1

## 2017-10-14 MED ORDER — SODIUM CHLORIDE 0.9 % IV SOLN
INTRAVENOUS | Status: DC
  Administered 2017-10-14: 1000 mL via INTRAVENOUS

## 2017-10-14 NOTE — ED Triage Notes (Signed)
**Note De-Identified [place] Obfuscation** Pt states that he has been having swelling in his abd and leg. He had sob last night. He had diarrhea with this also

## 2017-10-14 NOTE — ED Provider Notes (Signed)
**Note De-Identified Torain Obfuscation** [facility] EMERGENCY DEPARTMENT Provider Note   CSN: [id number] Arrival date & time: 10/14/17  1231     History   Chief Complaint Chief Complaint  Patient presents with  . Abdominal Pain  . Leg Swelling    HPI Stephone Guardino is a 62 y.o. male.  HPI  The patient is a unfortunate 62 year old male who has a history of chronic alcohol abuse, significant amounts of alcohol intake including about 1 case of beer per day which is gradually slowed down over time, his last drink was 4 days ago.  He presents to the hospital today because he has had increasing amounts of abdominal swelling as well as lower extremity swelling and is now feeling short of breath when he lays down.  He can feel short of breath when he is sitting up.  He has been seen at his family doctor's office approximately 1 month ago during which time he was noted to have a soft nontender abdomen which was not distended but did have hepatomegaly.  At that time he was evaluated for chronic diarrhea, this is intermittent, explosive, he lost his job because of this and ultimately has not been taking his daily medications because he cannot afford them because of the loss of his job.  The patient states that his abdominal swelling is gradually getting worse, has become more significant and severe, it is associated with mild diffuse discomfort but not frank pain.  There is no vomiting, no fevers, he does endorse having several skin lesions especially on his bitemporal areas right greater than left, these of undergone biopsy, he is unaware of the results because he did not follow-up.  1 of them has been starting to smell poorly.  Past Medical History:  Diagnosis Date  . Acute renal failure ([place]) 02/07/2013  . Alcoholism ([place])   . Hepatitis C 02/10/2013  . Lytic lesion of bone on x-ray 02/07/2013  . Pericardial effusion 02/07/2013  . UTI (urinary tract infection) 02/07/2013    Patient Active Problem List   Diagnosis Date Noted  .  Essential hypertension 08/21/2017  . Compression fracture of first lumbar vertebra ([place]) 11/29/2016  . 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 Tobacco use disorder, severe, dependence 11/24/2016  . Skin lesion of face 11/24/2016  . Chest skin lesion 11/24/2016  . Concern about skin cancer without diagnosis 11/24/2016  . Weight loss, non-intentional 11/24/2016  . Chronic cough 11/24/2016  . Alcohol use disorder, moderate, dependence ([place]) 11/24/2016  . 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 Hepatitis C virus infection 02/19/2013  . Ascites 02/10/2013  . Pericardial effusion 02/07/2013  . Lytic lesion of bone on x-ray 02/07/2013  . Liver cirrhosis, alcoholic ([place]) 71/24/5809    Past Surgical History:  Procedure Laterality Date  . ESOPHAGOGASTRODUODENOSCOPY N/A 02/12/2013   Procedure: ESOPHAGOGASTRODUODENOSCOPY (EGD);  Surgeon: Gatha Mayer, MD;  [facility]COPY;  Service: Endoscopy;  Laterality: N/A;        Home Medications    Prior to Admission medications   Medication Sig Start Date End Date Taking? Authorizing Provider  amLODipine (NORVASC) 10 MG tablet Take 1 tablet (10 mg total) by mouth daily. 09/11/17  Yes Gottschalk, Leatrice Jewels M, DO  hydrochlorothiazide (HYDRODIURIL) 12.5 MG tablet Take 1 tablet (12.5 mg total) by mouth daily. 09/11/17  Yes Gottschalk, Ashly M, DO  umeclidinium-vilanterol (ANORO ELLIPTA) 62.5-25 MCG/INH AEPB Inhale 1 puff into the lungs daily. **Every day inhaler** 12/15/16  Yes Gottschalk, Leatrice Jewels M, DO  albuterol ( **Note De-Identified Ord Obfuscation** PROVENTIL HFA;VENTOLIN HFA) 108 (90 Base) MCG/ACT inhaler Inhale 2 puffs into the lungs every 6 (six) hours as needed for wheezing or shortness of breath. **Rescue/ Emergency inhaler 12/15/16   Janora Norlander, DO    Family History Family History  Problem Relation Age of Onset  . Diabetes type II Mother   . Diabetes type II Father   . Alcoholism Father   . Cirrhosis Father   .  Colon cancer Neg Hx   . Throat cancer Neg Hx   . Heart disease Neg Hx   . Kidney disease Neg Hx     Social History Social History   Tobacco Use  . Smoking status: Current Every Day Smoker    Packs/day: 0.10    Types: Cigarettes    Last attempt to quit: 02/06/2013    Years since quitting: 4.6  . Smokeless tobacco: Former Network engineer Use Topics  . Alcohol use: Not Currently    Alcohol/week: 7.2 oz    Types: 12 Cans of beer per week    Comment: 12 beers daily x 40 years--- as of 02-18-13, has 2 beers daily.  . Drug use: No    Types: Marijuana    Comment: has d/c as of 01/2013     Allergies   Patient has no known allergies.   Review of Systems Review of Systems  All other systems reviewed and are negative.    Physical Exam Updated Vital Signs BP (!) 153/98 (BP Location: Left Arm)   Pulse 96   Temp 97.7 F (36.5 C) (Oral)   Resp (!) 24   Ht 5\' 10"  (1.778 m)   Wt 71.2 kg (157 lb)   SpO2 92%   BMI 22.53 kg/m   Physical Exam  Constitutional: He appears well-developed and well-nourished. No distress.  HENT:  Head: Normocephalic and atraumatic.  Mouth/Throat: Oropharynx is clear and moist. No oropharyngeal exudate.  There is muscle wasting, his eyes are sunken, he has a foul-smelling necrotic wound to his right temporal area.  There is some purulent material expressed from around this area.  Oropharynx is dry appearing, dentition is very poor.  Eyes: Pupils are equal, round, and reactive to light. Conjunctivae and EOM are normal. Right eye exhibits no discharge. Left eye exhibits no discharge. No scleral icterus.  No obvious jaundice  Neck: Normal range of motion. Neck supple. No JVD present. No thyromegaly present.  Cardiovascular: Normal rate, regular rhythm, normal heart sounds and intact distal pulses. Exam reveals no gallop and no friction rub.  No murmur heard. Pulmonary/Chest: Effort normal. No respiratory distress. He has no wheezes. He has rales.    Abdominal: Bowel sounds are normal. He exhibits distension. He exhibits no mass. There is no tenderness.  Fluid wave present, dull to percussion diffusely, hepatomegaly palpated.  The patient is severely distended, it is not soft.  Musculoskeletal: Normal range of motion. He exhibits edema ( Edema present from his feet ankles up to the mid shin.). He exhibits no tenderness.  Lymphadenopathy:    He has no cervical adenopathy.  Neurological: He is alert. Coordination normal.  Patient is awake alert and able to follow commands, he is able to sit up in the bed though this is slightly uncomfortable for him to do this because of his abdominal girth  Skin: Skin is warm and dry. No rash noted. No erythema.  Chronic wounds as noted to the right temporal area  Psychiatric: He has a normal mood and affect. His behavior is **Note De-Identified [place]i Obfuscation** normal.  Nursing note and vitals reviewed.    ED Treatments / Results  Labs (all labs ordered are listed, but only abnormal results are displayed) Labs Reviewed - No data to display  EKG None  Radiology No results found.  Procedures .Paracentesis Date/Time: 10/14/2017 7:09 PM Performed by: Noemi Chapel, MD Authorized by: Noemi Chapel, MD   Consent:    Consent obtained:  Written and verbal   Consent given by:  Patient   Risks discussed:  Bleeding, bowel perforation, infection and pain   Alternatives discussed:  No treatment and delayed treatment Pre-procedure details:    Procedure purpose:  Diagnostic   Preparation: Patient was prepped and draped in usual sterile fashion   Anesthesia (see MAR for exact dosages):    Anesthesia method:  Local infiltration   Local anesthetic:  Lidocaine 1% w/o epi Procedure details:    Needle gauge:  18   Ultrasound guidance: yes     Puncture site:  L lower quadrant   Fluid removed amount:  5074mL   Fluid appearance:  Amber and yellow   Dressing:  Adhesive bandage Post-procedure details:    Patient tolerance of procedure:   Tolerated well, no immediate complications Comments:         (including critical care time)  Medications Ordered in ED Medications - No data to display   Initial Impression / Assessment and Plan / ED Course  I have reviewed the triage vital signs and the nursing notes.  Pertinent labs & imaging results that were available during my care of the patient were reviewed by me and considered in my medical decision making (see chart for details).  Clinical Course as of Oct 15 1906  Sun Oct 14, 2017  1506 WBC: 8.5 [BM]  1506 HCT: 48.2 [BM]  1506 Platelets(!): 131 [BM]  1506 Total Bilirubin(!): 2.2 [BM]  1506 Alkaline Phosphatase(!): 132 [BM]  1506 ALT(!): 48 [BM]  1506 AST(!): 363 [BM]  1506 INR: 1.21 [BM]  1506 Ammonia(!): 48 [BM]    Clinical Course User Index [BM] Noemi Chapel, MD   The patient is ill-appearing, this is chronic in nature, I suspect that he has severe cirrhosis and now has developed ascites, he will be in need of paracentesis due to his symptomatic condition.  He will likely need some diuresis, he will likely need albumin, he has an infection in this right temporal wound and has some abnormal lung sounds with rales that are subtle but present.  I discussed care with Dr. Delton Coombes of the oncology service who will follow up with the patient this week.  He will have the office contact the patient.  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  He is aware that the oncology offices will follow up.  He is aware of the medication that he needs to take in follow-up.  He is aware of the reasons for return.  He is done extremely well after getting albumin and after almost 5 L was taken off by paracentesis.  Stable for discharge.  Lovenox and  coumadin given prior to d/c.  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

## 2017-10-14 NOTE — ED Notes (Signed)
**Note De-Identified Rami Obfuscation** Patient tolerated paracentesis well. 3,600ML of fluid removed. Abd now soft. Patient reports improvement in shortness of breath.

## 2017-10-14 NOTE — Discharge Instructions (Signed)
**Note De-Identified Sunde Obfuscation** If the office has not called you by the end of the day tomorrow, please call to arrange an appointment.  Please take the Lovenox shot twice a day for the next 5 days.  Please start taking Coumadin, 5 mg by mouth daily for the next 7 days.  You will need to follow-up for blood test within 7 days, hopefully this can be done at the cancer doctor's office.  If you do not have a family doctor you will also need that.  Please see the list below for local family doctors.  [facility] Primary Care Doctor List    Sinda Du MD. Specialty: Pulmonary Disease Contact information: [place]  [place] [address]  [phone]   Tula Nakayama, MD. Specialty: [facility] Contact information: [address], [address] [address]  [phone]   Sallee Lange, MD. Specialty: [facility] Medicine Contact information: [place]  [place] [address]  [phone]   Rosita Fire, MD Specialty: Internal Medicine Contact information: [place] [address]  [phone]   Delphina Cahill, MD. Specialty: Internal Medicine Contact information: [place] [address]  [phone]    [facility] (Dr. Maudie Mercury) Specialty: Family Medicine Contact information: [place] [address]  [phone]   Leslie Andrea, MD. Specialty: [facility] Contact information: [place] [address]  [phone]   Asencion Noble, MD. Specialty: Internal Medicine Contact information: [address]  [place] [address]  [place]  [address] [place], [place] [address] [phone]  Services [facility] offers a variety of basic health services.  Services include but are not limited to: Blood pressure checks  Heart rate checks  Blood sugar checks  Urine  analysis  Rapid strep tests  Pregnancy tests.  Health education and referrals  People needing more complex services will be directed to a physician online. Using these virtual visits, doctors can evaluate and prescribe medicine and treatments. There will be no medication on-site, though [facility] will help patients fill their prescriptions at little to no cost.   For More information please go to: [email or web address]

## 2017-10-15 LAB — PATHOLOGIST SMEAR REVIEW

## 2017-10-15 LAB — AFP TUMOR MARKER: AFP, SERUM, TUMOR MARKER: 93661 ng/mL — AB (ref 0.0–8.3)

## 2017-10-17 ENCOUNTER — Encounter (HOSPITAL_COMMUNITY): Payer: Self-pay | Admitting: Hematology

## 2017-10-17 ENCOUNTER — Inpatient Hospital Stay (HOSPITAL_COMMUNITY): Payer: Medicaid Other | Attending: Hematology | Admitting: Hematology

## 2017-10-17 ENCOUNTER — Encounter (HOSPITAL_COMMUNITY): Payer: Self-pay

## 2017-10-17 ENCOUNTER — Other Ambulatory Visit: Payer: Self-pay

## 2017-10-17 ENCOUNTER — Emergency Department (HOSPITAL_COMMUNITY): Payer: Medicaid Other

## 2017-10-17 ENCOUNTER — Emergency Department (HOSPITAL_COMMUNITY)
Admission: EM | Admit: 2017-10-17 | Discharge: 2017-10-17 | Disposition: A | Discharge status: Home / Self Care | Payer: Medicaid Other | Attending: Emergency Medicine | Admitting: Emergency Medicine

## 2017-10-17 VITALS — BP 139/76 | HR 88 | Temp 98.1°F | Resp 18 | Ht 67.0 in | Wt 161.0 lb

## 2017-10-17 DIAGNOSIS — K7031 Alcoholic cirrhosis of liver with ascites: Secondary | ICD-10-CM

## 2017-10-17 DIAGNOSIS — Z7901 Long term (current) use of anticoagulants: Secondary | ICD-10-CM | POA: Insufficient documentation

## 2017-10-17 DIAGNOSIS — F17210 Nicotine dependence, cigarettes, uncomplicated: Secondary | ICD-10-CM | POA: Insufficient documentation

## 2017-10-17 DIAGNOSIS — Z79899 Other long term (current) drug therapy: Secondary | ICD-10-CM

## 2017-10-17 DIAGNOSIS — N189 Chronic kidney disease, unspecified: Secondary | ICD-10-CM | POA: Insufficient documentation

## 2017-10-17 DIAGNOSIS — J449 Chronic obstructive pulmonary disease, unspecified: Secondary | ICD-10-CM | POA: Diagnosis not present

## 2017-10-17 DIAGNOSIS — B1920 Unspecified viral hepatitis C without hepatic coma: Secondary | ICD-10-CM | POA: Insufficient documentation

## 2017-10-17 DIAGNOSIS — I129 Hypertensive chronic kidney disease with stage 1 through stage 4 chronic kidney disease, or unspecified chronic kidney disease: Secondary | ICD-10-CM | POA: Diagnosis not present

## 2017-10-17 DIAGNOSIS — F1020 Alcohol dependence, uncomplicated: Secondary | ICD-10-CM | POA: Diagnosis not present

## 2017-10-17 DIAGNOSIS — D689 Coagulation defect, unspecified: Secondary | ICD-10-CM | POA: Diagnosis present

## 2017-10-17 DIAGNOSIS — R0602 Shortness of breath: Secondary | ICD-10-CM | POA: Diagnosis not present

## 2017-10-17 DIAGNOSIS — R188 Other ascites: Secondary | ICD-10-CM

## 2017-10-17 DIAGNOSIS — I81 Portal vein thrombosis: Secondary | ICD-10-CM | POA: Diagnosis not present

## 2017-10-17 DIAGNOSIS — C7800 Secondary malignant neoplasm of unspecified lung: Secondary | ICD-10-CM | POA: Diagnosis not present

## 2017-10-17 DIAGNOSIS — C220 Liver cell carcinoma: Secondary | ICD-10-CM | POA: Diagnosis present

## 2017-10-17 DIAGNOSIS — I313 Pericardial effusion (noninflammatory): Secondary | ICD-10-CM | POA: Diagnosis not present

## 2017-10-17 DIAGNOSIS — R160 Hepatomegaly, not elsewhere classified: Secondary | ICD-10-CM | POA: Insufficient documentation

## 2017-10-17 DIAGNOSIS — M899 Disorder of bone, unspecified: Secondary | ICD-10-CM

## 2017-10-17 LAB — CBC WITH DIFFERENTIAL/PLATELET
BASOS ABS: 0.1 10*3/uL (ref 0.0–0.1)
Basophils Relative: 1 %
Eosinophils Absolute: 0.1 10*3/uL (ref 0.0–0.7)
Eosinophils Relative: 2 %
HEMATOCRIT: 47 % (ref 39.0–52.0)
HEMOGLOBIN: 15.8 g/dL (ref 13.0–17.0)
LYMPHS PCT: 11 %
Lymphs Abs: 1 10*3/uL (ref 0.7–4.0)
MCH: 30.9 pg (ref 26.0–34.0)
MCHC: 33.6 g/dL (ref 30.0–36.0)
MCV: 91.8 fL (ref 78.0–100.0)
MONO ABS: 0.7 10*3/uL (ref 0.1–1.0)
Monocytes Relative: 8 %
NEUTROS ABS: 7.7 10*3/uL (ref 1.7–7.7)
NEUTROS PCT: 80 %
Platelets: 137 10*3/uL — ABNORMAL LOW (ref 150–400)
RBC: 5.12 MIL/uL (ref 4.22–5.81)
RDW: 20.1 % — AB (ref 11.5–15.5)
WBC: 9.6 10*3/uL (ref 4.0–10.5)

## 2017-10-17 LAB — BASIC METABOLIC PANEL
ANION GAP: 10 (ref 5–15)
BUN: 16 mg/dL (ref 6–20)
CHLORIDE: 98 mmol/L (ref 98–111)
CO2: 24 mmol/L (ref 22–32)
Calcium: 8.7 mg/dL — ABNORMAL LOW (ref 8.9–10.3)
Creatinine, Ser: 0.89 mg/dL (ref 0.61–1.24)
Glucose, Bld: 105 mg/dL — ABNORMAL HIGH (ref 70–99)
POTASSIUM: 3.6 mmol/L (ref 3.5–5.1)
SODIUM: 132 mmol/L — AB (ref 135–145)

## 2017-10-17 LAB — PROTIME-INR
INR: 1.34
Prothrombin Time: 16.5 seconds — ABNORMAL HIGH (ref 11.4–15.2)

## 2017-10-17 MED ORDER — POTASSIUM CHLORIDE CRYS ER 20 MEQ PO TBCR: 20.0000 meq | EXTENDED_RELEASE_TABLET | Freq: Every day | ORAL | 2 refills | Status: DC

## 2017-10-17 MED ORDER — "THROMBI-PAD 3""X3"" EX PADS"
1.0000 | MEDICATED_PAD | Freq: Once | CUTANEOUS | Status: AC
  Administered 2017-10-17: 1 via TOPICAL
  Filled 2017-10-17: qty 1

## 2017-10-17 MED ORDER — FUROSEMIDE 20 MG PO TABS: 40.0000 mg | ORAL_TABLET | Freq: Every day | ORAL | 2 refills | Status: DC

## 2017-10-17 MED ORDER — SPIRONOLACTONE 25 MG PO TABS: 25.0000 mg | ORAL_TABLET | Freq: Every day | ORAL | 2 refills | Status: DC

## 2017-10-17 NOTE — Assessment & Plan Note (Signed)
**Note De-Identified [place]roz[place] Obfuscation** 1.  Highly likely metastatic liver cancer to the lungs: - Presented to the ER on 10/14/2017 with 2-week history of abdominal swelling.  CT scan of the chest, abdomen and pelvis showed large mass in the right lobe measuring 20 x 14 x 15 cm.  Liver also shows nodular contour suggestive of cirrhosis.  Splenomegaly measuring 20 x 6.5 x 13.5 cm.  Numerous bilateral pulmonary nodules consistent with metastasis.  He was also found to have tumor invading and expanding into the right portal vein and extension into the adjacent main portal vein.  Question of old thrombus in the superior mesenteric vein.  Right portal vein also has thrombosis. -he underwent ultrasound-guided paracentesis with removal of 3.5 L.  He was started on Lovenox 70 mg every 12 hours along with Coumadin 5 mg at bedtime. - He has noticed abdominal distention again today and came to the ER and another 3.9 L of fluid was drained. - Has history of hepatitis C, most likely acquired through remote use of IV drugs. - Heavy alcohol abuse in the past 40 years, 2 pack/day cigarette smoker for the past 40 years. - AFP is elevated at 93,661.  However we need tissue confirmation to initiate any systemic therapy.  We will arrange it through ultrasound-guided biopsy by IR.  We will discuss treatment options after the biopsy is done. - To slow down the reaccumulation of ascites, I have will start him on Lasix 40 mg daily.  We will also start him on spironolactone 25 mg daily and titrated up as needed.  As his potassium is borderline we will also start him on potassium 20 mEq daily.  I have told him to discontinue HCTZ.  2.  Portal vein thrombus: - His INR today is still subtherapeutic at 1.34.  He will continue Coumadin 5 mg daily.  He will continue Lovenox 70 mg every 12 hours.  We will have to hold Lovenox for his biopsy.

## 2017-10-17 NOTE — Procedures (Signed)
**Note De-Identified Sr[place]k Obfuscation** PreOperative Dx: Cirrhosis, ascites Postoperative Dx: Cirrhosis, ascites Procedure:   US guided paracentesis Radiologist:  Thornton Papas Anesthesia:  10 ml of1% lidocaine Specimen:  3.9 L of yellow ascitic fluid EBL:   < 1 ml Complications: None

## 2017-10-17 NOTE — ED Notes (Signed)
**Note De-Identified Krul Obfuscation** Blood saturated dressing removed from left lower abdomen area. Blood continually oozing out

## 2017-10-17 NOTE — Discharge Instructions (Addendum)
**Note De-Identified [place] Obfuscation** Take your usual prescriptions as previously directed.  The abdominal binder will help hold pressure to your wound to help it stop bleeding. Go to your appointment today with the Oncologist as previously scheduled.  Call the GI doctor today to schedule a follow up appointment within the next week.  Return to the Emergency Department immediately sooner if worsening.

## 2017-10-17 NOTE — ED Provider Notes (Signed)
**Note De-Identified [place] [facility]bfuscation** [facility] EMERGENCY DEPARTMENT Provider Note   CSN: [id number] Arrival date & time: 10/17/17  7169     History   Chief Complaint Chief Complaint  Patient presents with  . Coagulation Disorder    HPI Marc Wilson is a 62 y.o. male.  HPI  Pt was seen at 1005. Per pt and his family, c/o gradual onset and persistence of constant "bleeding from paracentesis site" since it was performed 3 days ago. Family states he has started his lovenox and coumadin yesterday and dosed his Lovenox today. Pt has applying a dressing to the wound, but not holding pressure to it. Denies any other areas of bleeding. Denies CP/SOB, no abd pain, no N/V/D, no fevers.   Past Medical History:  Diagnosis Date  . Acute renal failure ([place]) 02/07/2013  . Alcoholism ([place])   . Hepatitis C 02/10/2013  . Lytic lesion of bone on x-ray 02/07/2013  . Pericardial effusion 02/07/2013  . UTI (urinary tract infection) 02/07/2013    Patient Active Problem List   Diagnosis Date Noted  . Essential hypertension 08/21/2017  . Compression fracture of first lumbar vertebra ([place]) 11/29/2016  . 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 Tobacco use disorder, severe, dependence 11/24/2016  . Skin lesion of face 11/24/2016  . Chest skin lesion 11/24/2016  . Concern about skin cancer without diagnosis 11/24/2016  . Weight loss, non-intentional 11/24/2016  . Chronic cough 11/24/2016  . Alcohol use disorder, moderate, dependence ([place]) 11/24/2016  . 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 Hepatitis C virus infection 02/19/2013  . Ascites 02/10/2013  . Pericardial effusion 02/07/2013  . Lytic lesion of bone on x-ray 02/07/2013  . Liver cirrhosis, alcoholic ([place]) 67/89/3810    Past Surgical History:  Procedure Laterality Date  . ESOPHAGOGASTRODUODENOSCOPY N/A 02/12/2013   Procedure: ESOPHAGOGASTRODUODENOSCOPY (EGD);  Surgeon: Gatha Mayer, MD;  Location: [facility] ENDOSCOPY;   Service: Endoscopy;  Laterality: N/A;        Home Medications    Prior to Admission medications   Medication Sig Start Date End Date Taking? Authorizing Provider  albuterol (PROVENTIL HFA;VENTOLIN HFA) 108 (90 Base) MCG/ACT inhaler Inhale 2 puffs into the lungs every 6 (six) hours as needed for wheezing or shortness of breath. **Rescue/ Emergency inhaler 12/15/16   Ronnie Doss M, DO  amLODipine (NORVASC) 10 MG tablet Take 1 tablet (10 mg total) by mouth daily. 09/11/17   Janora Norlander, DO  enoxaparin (LOVENOX) 150 MG/ML injection Inject 0.47 mLs (70 mg total) into the skin every 12 (twelve) hours for 5 days. 10/14/17 10/19/17  Noemi Chapel, MD  hydrochlorothiazide (HYDRODIURIL) 12.5 MG tablet Take 1 tablet (12.5 mg total) by mouth daily. 09/11/17   Janora Norlander, DO  umeclidinium-vilanterol (ANORO ELLIPTA) 62.5-25 MCG/INH AEPB Inhale 1 puff into the lungs daily. **Every day inhaler** 12/15/16   Ronnie Doss M, DO  warfarin (COUMADIN) 5 MG tablet Take 1 tablet (5 mg total) by mouth daily. 10/14/17 12/09/2017  Noemi Chapel, MD    Family History Family History  Problem Relation Age of Onset  . Diabetes type II Mother   . Diabetes type II Father   . Alcoholism Father   . Cirrhosis Father   . Colon cancer Neg Hx   . Throat cancer Neg Hx   . Heart disease Neg Hx   . Kidney disease Neg Hx     Social History Social History   Tobacco Use  . Smoking status: Current Every Day Smoker **Note De-Identified [place] Obfuscation** Packs/day: 0.50    Types: Cigarettes    Last attempt to quit: 02/06/2013    Years since quitting: 4.6  . Smokeless tobacco: Former Network engineer Use Topics  . Alcohol use: Not Currently    Alcohol/week: 7.2 oz    Types: 12 Cans of beer per week    Comment: 12 beers daily x 40 years--- as of 02-18-13, has 2 beers daily.  . Drug use: No    Types: Marijuana    Comment: has d/c as of 01/2013     Allergies   Patient has no known allergies.   Review of Systems Review of  Systems ROS: Statement: All systems negative except as marked or noted in the HPI; Constitutional: Negative for fever and chills. ; ; Eyes: Negative for eye pain, redness and discharge. ; ; ENMT: Negative for ear pain, hoarseness, nasal congestion, sinus pressure and sore throat. ; ; Cardiovascular: Negative for chest pain, palpitations, diaphoresis, dyspnea and peripheral edema. ; ; Respiratory: Negative for cough, wheezing and stridor. ; ; Gastrointestinal: Negative for nausea, vomiting, diarrhea, abdominal pain, blood in stool, hematemesis, jaundice and rectal bleeding. . ; ; Genitourinary: Negative for dysuria, flank pain and hematuria. ; ; Musculoskeletal: Negative for back pain and neck pain. Negative for swelling and trauma.; ; Skin: +bruising, bleeding from puncture site. Negative for pruritus, rash, abrasions, blisters, and skin lesion.; ; Neuro: Negative for headache, lightheadedness and neck stiffness. Negative for weakness, altered level of consciousness, altered mental status, extremity weakness, paresthesias, involuntary movement, seizure and syncope.       Physical Exam Updated Vital Signs BP (!) 147/86 (BP Location: Right Arm)   Pulse 96   Temp 98 F (36.7 C) (Oral)   Resp 18   Ht 5\' 7"  (1.702 m)   Wt 71.2 kg (157 lb)   SpO2 95%   BMI 24.59 kg/m   Physical Exam 1010: Physical examination:  Nursing notes reviewed; Vital signs and O2 SAT reviewed;  Constitutional: Well developed, Well nourished, Well hydrated, In no acute distress; Head:  Normocephalic, atraumatic; Eyes: EOMI, PERRL, No scleral icterus; ENMT: Mouth and pharynx normal, Mucous membranes moist; Neck: Supple, Full range of motion, No lymphadenopathy; Cardiovascular: Regular rate and rhythm, No gallop; Respiratory: Breath sounds clear & equal bilaterally, No wheezes.  Speaking full sentences with ease, Normal respiratory effort/excursion; Chest: Nontender, Movement normal; Abdomen:  Nontender, +softly distended, Normal  bowel sounds. +left lower abd wall with very small round area of superficial ecchymosis and punctate area of oozing. ; Genitourinary: No CVA tenderness; Extremities: Peripheral pulses normal, No tenderness, +2 pedal edema bilat. No calf asymmetry.; Neuro: AA&Ox3, Major CN grossly intact.  Speech clear. No gross focal motor or sensory deficits in extremities. Climbs on and off stretcher easily by himself. Gait steady..; Skin: Color normal, Warm, Dry.   ED Treatments / Results  Labs (all labs ordered are listed, but only abnormal results are displayed)   EKG None  Radiology   Procedures Procedures (including critical care time)  Medications Ordered in ED Medications  THROMBI-PAD 3"X3" pad 1 each (has no administration in time range)     Initial Impression / Assessment and Plan / ED Course  I have reviewed the triage vital signs and the nursing notes.  Pertinent labs & imaging results that were available during my care of the patient were reviewed by me and considered in my medical decision making (see chart for details).  MDM Reviewed: previous chart, nursing note and vitals Reviewed previous: labs **Note De-Identified [place] Obfuscation** and CT scan Interpretation: labs    Results for orders placed or performed during the hospital encounter of 10/17/17  CBC with Differential  Result Value Ref Range   WBC 9.6 4.0 - 10.5 K/uL   RBC 5.12 4.22 - 5.81 MIL/uL   Hemoglobin 15.8 13.0 - 17.0 g/dL   HCT 47.0 39.0 - 52.0 %   MCV 91.8 78.0 - 100.0 fL   MCH 30.9 26.0 - 34.0 pg   MCHC 33.6 30.0 - 36.0 g/dL   RDW 20.1 (H) 11.5 - 15.5 %   Platelets 137 (L) 150 - 400 K/uL   Neutrophils Relative % 80 %   Neutro Abs 7.7 1.7 - 7.7 K/uL   Lymphocytes Relative 11 %   Lymphs Abs 1.0 0.7 - 4.0 K/uL   Monocytes Relative 8 %   Monocytes Absolute 0.7 0.1 - 1.0 K/uL   Eosinophils Relative 2 %   Eosinophils Absolute 0.1 0.0 - 0.7 K/uL   Basophils Relative 1 %   Basophils Absolute 0.1 0.0 - 0.1 K/uL  Protime-INR  Result Value Ref  Range   Prothrombin Time 16.5 (H) 11.4 - 15.2 seconds   INR 5.85   Basic metabolic panel  Result Value Ref Range   Sodium 132 (L) 135 - 145 mmol/L   Potassium 3.6 3.5 - 5.1 mmol/L   Chloride 98 98 - 111 mmol/L   CO2 24 22 - 32 mmol/L   Glucose, Bld 105 (H) 70 - 99 mg/dL   BUN 16 6 - 20 mg/dL   Creatinine, Ser 0.89 0.61 - 1.24 mg/dL   Calcium 8.7 (L) 8.9 - 10.3 mg/dL   GFR calc non Af Amer >60 >60 mL/min   GFR calc Af Amer >60 >60 mL/min   Anion gap 10 5 - 15   US Paracentesis Result Date: 10/17/2017 INDICATION: Ascites, cirrhosis, recent low volume paracentesis, continued leakage of fluid from the paracentesis site in the LEFT lower quadrant EXAM: ULTRASOUND GUIDED THERAPEUTIC PARACENTESIS MEDICATIONS: None. COMPLICATIONS: None immediate. PROCEDURE: Procedure, benefits, and risks of procedure were discussed with patient. Written informed consent for procedure was obtained. Time out protocol followed. Adequate collection of ascites localized by ultrasound in RIGHT lower quadrant. Skin prepped and draped in usual sterile fashion. Skin and soft tissues anesthetized with 10 mL of 1% lidocaine. 5 [place] Yueh catheter placed into peritoneal cavity. 3.9 L of yellow ascitic fluid aspirated by vacuum bottle suction. Procedure tolerated well by patient without immediate complication. FINDINGS: As above IMPRESSION: Successful ultrasound-guided paracentesis yielding 3.9 liters of peritoneal fluid. Electronically Signed   By: Lavonia Dana M.D.   On: 10/17/2017 12:20    1300:  Pt feels better and wants to leave to go to his Onc MD appointment. Pt will need GI MD f/u for continued paracentesis. Platelets per baseline. H/H stable. INR not elevated. Thrombi pad/DSD and abd binder placed to previous abd puncture site with good results. Dx and testing d/w pt and family.  Questions answered.  Verb understanding, agreeable to d/c home with outpt f/u.   Final Clinical Impressions(s) / ED Diagnoses   Final  diagnoses:  Ascites    ED Discharge Orders    None       Francine Graven, DO 10/21/17 1727

## 2017-10-17 NOTE — ED Triage Notes (Signed)
**Note De-Identified [place] [facility]bfuscation** Pt reports that he was seen Sunday and had a paracentesis and since procedure he has been bleeding from site and has progressively increased

## 2017-10-17 NOTE — ED Notes (Signed)
**Note De-Identified [place] Obfuscation** New dressing applied to older paracentesis site. Previous bandage noted to have bloody drainage after paracentesis. Pressure held with thrombi pad and ABD pad then place tape to dressing.

## 2017-10-17 NOTE — ED Notes (Signed)
**Note De-Identified [place] [facility]bfuscation** Materials called for universal sized abd binder.

## 2017-10-17 NOTE — Progress Notes (Signed)
**Note De-Identified [place] Obfuscation** [facility] CONSULT NOTE  Patient Care Team: Patient, No Pcp Per as PCP - General (General Practice)  CHIEF COMPLAINTS/PURPOSE OF CONSULTATION:  Highly likely metastatic hepatocellular carcinoma.  HISTORY OF PRESENTING ILLNESS:  Marc Wilson 62 y.o. male is seen in consultation today for further work-up and management of metastatic hepatocellular carcinoma.  He has noticed rapid accumulation of fluid in his abdomen and lower extremities for past 2 weeks and presented to the ER on 10/14/2017.  A CT scan of the chest, abdomen and pelvis showed right lobe liver mass measuring 20 x 14 x 15 cm.  Liver was suggestive of cirrhosis.  Splenomegaly was also found.  Numerous bilateral lung nodules were found.  He underwent US guided paracentesis when 3.5 L was removed.  He was made an appointment to see [place] in the office.  On our way out to the office he stopped by at the ER as he noticed more reaccumulation of fluid.  Repeat paracentesis was done on 3.9 L of fluid was removed today.  He also noted some bleeding at the paracentesis site from Sunday.  He denies any bleeding.  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  He has a history of heavy alcohol abuse with one case of beer daily for 20 years followed by 12-15 beers daily in the last 20 years.  He also smoked 2 packs/day of cigarettes for the past 40 years.  He does report occasional vomiting.  he worked in [employer] for 17 years and recently lost his job.  He attributes that to loss of bowel control and multiple accidents at work.  He lost about 10 pounds in the last 3 to 6 months.  He has a remote history of IV drug abuse and hepatitis C. Family history is suggestive of sister having an unknown cancer.  Another sister had CLL and gastric carcinoid. Patient currently lives by himself.  His daughter lives close by. MEDICAL HISTORY:  Past Medical History:   Diagnosis Date  . Acute renal failure ([place]) 02/07/2013  . Alcoholism ([place])   . Hepatitis C 02/10/2013  . Liver cancer ([place])   . Lung cancer ([place])   . Lytic lesion of bone on x-ray 02/07/2013  . Pericardial effusion 02/07/2013  . UTI (urinary tract infection) 02/07/2013    SURGICAL HISTORY: Past Surgical History:  Procedure Laterality Date  . ESOPHAGOGASTRODUODENOSCOPY N/A 02/12/2013   Procedure: ESOPHAGOGASTRODUODENOSCOPY (EGD);  Surgeon: Gatha Mayer, MD;  Location: [facility] ENDOSCOPY;  Service: Endoscopy;  Laterality: N/A;    SOCIAL HISTORY: Social History   Socioeconomic History  . Marital status: Legally Separated    Spouse name: Not on file  . Number of children: 1  . Years of education: Not on file  . Highest education level: 8th grade  Occupational History  . Not on file  Social Needs  . Financial resource strain: Very hard  . Food insecurity:    Worry: Never true    Inability: Often true  . Transportation needs:    Medical: No    Non-medical: No  Tobacco Use  . Smoking status: Current Every Day Smoker    Packs/day: 0.25    Years: 40.00    Pack years: 10.00    Types: Cigarettes  . Smokeless tobacco: Never Used  Substance and Sexual Activity  . Alcohol use: Not Currently    Alcohol/week: 7.2 oz **Note De-Identified [place] Obfuscation** Types: 12 Cans of beer per week    Comment: 12 beers daily x 40 years  . Drug use: No    Types: Marijuana    Comment: has d/c as of 01/2013  . Sexual activity: Not on file  Lifestyle  . Physical activity:    Days per week: 0 days    Minutes per session: 0 min  . Stress: Very much  Relationships  . Social connections:    Talks on phone: Once a week    Gets together: Twice a week    Attends religious service: Never    Active member of club or organization: No    Attends meetings of clubs or organizations: Never    Relationship status: Divorced  . Intimate partner violence:    Fear of current or ex partner: No    Emotionally abused: No    Physically abused:  No    Forced sexual activity: No  Other Topics Concern  . Not on file  Social History Narrative   Pt states that "i'm broke and I need all the help I can get". He also states that in 2 months he will likely be homeless.    FAMILY HISTORY: Family History  Problem Relation Age of Onset  . Diabetes type II Mother   . Cirrhosis Mother   . Diabetes type II Father   . Alcoholism Father   . Cirrhosis Father   . Cancer Sister   . Cancer Sister   . Diabetes type II Brother   . Cancer Brother   . Pulmonary embolism Daughter   . Bipolar disorder Daughter   . Depression Daughter   . Colon cancer Neg Hx   . Throat cancer Neg Hx   . Heart disease Neg Hx   . Kidney disease Neg Hx     ALLERGIES:  has No Known Allergies.  MEDICATIONS:  Current Outpatient Medications  Medication Sig Dispense Refill  . albuterol (PROVENTIL HFA;VENTOLIN HFA) 108 (90 Base) MCG/ACT inhaler Inhale 2 puffs into the lungs every 6 (six) hours as needed for wheezing or shortness of breath. **Rescue/ Emergency inhaler 1 Inhaler 2  . enoxaparin (LOVENOX) 150 MG/ML injection Inject 0.47 mLs (70 mg total) into the skin every 12 (twelve) hours for 5 days. 4.7 mL 0  . hydrochlorothiazide (HYDRODIURIL) 12.5 MG tablet Take 1 tablet (12.5 mg total) by mouth daily. 30 tablet 1  . umeclidinium-vilanterol (ANORO ELLIPTA) 62.5-25 MCG/INH AEPB Inhale 1 puff into the lungs daily. **Every day inhaler** 1 each 12  . warfarin (COUMADIN) 5 MG tablet Take 1 tablet (5 mg total) by mouth daily. 30 tablet 0  . amLODipine (NORVASC) 10 MG tablet Take 1 tablet (10 mg total) by mouth daily. (Patient not taking: Reported on 10/17/2017) 90 tablet 3  . furosemide (LASIX) 20 MG tablet Take 2 tablets (40 mg total) by mouth daily. 30 tablet 2  . potassium chloride SA (K-DUR,KLOR-CON) 20 MEQ tablet Take 1 tablet (20 mEq total) by mouth daily. 30 tablet 2  . spironolactone (ALDACTONE) 25 MG tablet Take 1 tablet (25 mg total) by mouth daily. 30 tablet 2    No current facility-administered medications for this visit.     REVIEW OF SYSTEMS:   Constitutional: Denies fevers, chills or abnormal night sweats.  Fatigue present. Eyes: Denies blurriness of vision, double vision or watery eyes Ears, nose, mouth, throat, and face: Denies mucositis or sore throat Respiratory: Positive for shortness of breath on exertion. Cardiovascular: Denies palpitation, chest discomfort or **Note De-Identified [place] Obfuscation** lower extremity swelling Gastrointestinal: Positive for constipation and diarrhea.  Occasional vomiting positive. Skin: Denies abnormal skin rashes Lymphatics: Denies new lymphadenopathy or easy bruising Neurological:Denies numbness, tingling or new weaknesses Behavioral/Psych: Mood is stable, no new changes  All other systems were reviewed with the patient and are negative.  PHYSICAL EXAMINATION: ECOG PERFORMANCE STATUS: 1 - Symptomatic but completely ambulatory  Vitals:   10/17/17 1422  BP: 139/76  Pulse: 88  Resp: 18  Temp: 98.1 F (36.7 C)  SpO2: 97%   Filed Weights   10/17/17 1422  Weight: 161 lb (73 kg)    GENERAL:alert, no distress and comfortable SKIN: skin color, texture, turgor are normal, no rashes or significant lesions EYES: normal, conjunctiva are pink and non-injected, sclera clear OROPHARYNX:no exudate, no erythema and lips, buccal mucosa, and tongue normal  NECK: supple, thyroid normal size, non-tender, without nodularity LYMPH:  no palpable lymphadenopathy in the cervical, axillary or inguinal LUNGS: clear to auscultation and percussion with normal breathing effort HEART: regular rate & rhythm and no murmurs and no lower extremity edema ABDOMEN: Soft.  Abdominal binder present.  Liver palpable 4 to 5 fingerbreadths below the right costal margin.   PSYCH: alert & oriented x 3 with fluent speech   LABORATORY DATA:  I have reviewed the data as listed Lab Results  Component Value Date   WBC 9.6 10/17/2017   HGB 15.8 10/17/2017   HCT 47.0  10/17/2017   MCV 91.8 10/17/2017   PLT 137 (L) 10/17/2017     Chemistry      Component Value Date/Time   NA 132 (L) 10/17/2017 1032   NA 134 08/21/2017 0830   K 3.6 10/17/2017 1032   CL 98 10/17/2017 1032   CO2 24 10/17/2017 1032   BUN 16 10/17/2017 1032   BUN 10 08/21/2017 0830   CREATININE 0.89 10/17/2017 1032      Component Value Date/Time   CALCIUM 8.7 (L) 10/17/2017 1032   ALKPHOS 132 (H) 10/14/2017 1325   AST 363 (H) 10/14/2017 1325   ALT 48 (H) 10/14/2017 1325   BILITOT 2.2 (H) 10/14/2017 1325   BILITOT 0.6 11/24/2016 1116       RADIOGRAPHIC STUDIES: I have personally reviewed the radiological images as listed and agreed with the findings in the report. Dg Chest 2 View  Result Date: 10/14/2017 CLINICAL DATA:  Shortness of breath and cough. EXAM: CHEST - 2 VIEW COMPARISON:  11/24/2016 FINDINGS: Heart size appears normal. No pleural effusion identified. There are innumerable pulmonary nodules identified throughout both lungs. These are suspicious for metastatic disease. A larger nodular opacity is identified in the left base measuring 2.4 cm. IMPRESSION: 1. Diffuse pulmonary nodularity worrisome for metastatic disease. Suggest further evaluation with contrast enhanced CT of the chest. Electronically Signed   By: Kerby Moors M.D.   On: 10/14/2017 14:40   Ct Chest W Contrast  Result Date: 10/14/2017 CLINICAL DATA:  Abdominal and leg swelling. Short of breath since last night. Also with diarrhea. EXAM: CT CHEST, ABDOMEN, AND PELVIS WITH CONTRAST TECHNIQUE: Multidetector CT imaging of the chest, abdomen and pelvis was performed following the standard protocol during bolus administration of intravenous contrast. CONTRAST:  20mL OMNIPAQUE IOHEXOL 300 MG/ML  SOLN COMPARISON:  Current chest radiograph. Abdomen pelvis CT, 02/07/2013. FINDINGS: CT CHEST FINDINGS Cardiovascular: Heart is normal in size and configuration. Mild left coronary artery calcifications. Great vessels normal  in caliber. No aortic dissection. No significant atherosclerosis. Aortic arch branch vessels are widely patent. Mediastinum/Nodes: No enlarged **Note De-Identified [place] Obfuscation** mediastinal, hilar, or axillary lymph nodes. Thyroid gland, trachea, and esophagus demonstrate no significant findings. Lungs/Pleura: There are numerous bilateral pulmonary nodules consistent with widespread metastatic disease. More confluent opacities noted in both lower lobes, consistent with a combination of metastatic disease and atelectasis or, less likely, infection. There are underlying changes of mild centrilobular emphysema. No evidence of pulmonary edema. No pleural effusion or pneumothorax. CT ABDOMEN PELVIS FINDINGS Hepatobiliary: Abnormal appearance of the liver. There is heterogeneous poorly defined mass that involves most of the right lobe, measuring approximately 20 x 14 x 15 cm. This mass shows areas of heterogeneous decreased attenuation with adjacent areas of intermediate and higher attenuation. The liver also demonstrates a nodular contour and relative enlargement of the lateral segment left lobe, findings consistent with cirrhosis. Overall, the appearance is consistent with a large area of a cellular carcinoma in the setting of cirrhosis. Hypoattenuation is seen at the origin of the right portal vein consistent with tumor thrombosis. This appears to extend just into the main portal vein. There is decreased opacification of the superior mesenteric vein which may reflect additional thrombosis or be due to slow flow. The left portal vein is patent. There is mild thickening and increased enhancement of the wall of the gallbladder most evident along its lower aspect. No convincing gallstone. No bile duct dilation. Pancreas: Unremarkable. No pancreatic ductal dilatation or surrounding inflammatory changes. Spleen: Spleen is enlarged measuring 20 x 6.5 x 13.5 cm. No splenic mass or focal lesion. Adrenals/Urinary Tract: Adrenal glands are unremarkable. Kidneys  are normal, without renal calculi, focal lesion, or hydronephrosis. Bladder is unremarkable. Stomach/Bowel: Stomach is unremarkable. No bowel dilation, wall thickening or convincing inflammation. Normal appendix. Vascular/Lymphatic: There are perisplenic, coronary and para soft angio venous collaterals. Aorta demonstrates atherosclerotic disease which extends into the iliac vessels. No aneurysm. 13 mm shotty aortocaval node the level of the left renal vein no other discrete enlarged lymph nodes. Reproductive: Unremarkable. Other: Small to moderate amount of ascites. MUSCULOSKELETAL FINDINGS No acute fracture. Old fracture of L1. No osteoblastic or osteolytic lesions. IMPRESSION: 1. Findings are consistent with a large or multifocal hepatocellular carcinoma involving most of the right liver lobe, with extensive pulmonary metastatic disease. There is a single shotty mildly enlarged aortocaval node which may reflect metastatic adenopathy. Tumor invades and expands the right portal vein with extension into the adjacent main portal vein. The left portal vein remains patent. Questionable thrombus in the superior mesenteric vein. Small to moderate amount of ascites. This may be due to cirrhosis and portal venous hypertension, suspected. Peritoneal metastatic disease is not excluded although not discretely visualized. 2. No other evidence of metastatic disease. 3. 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 4. More confluent type opacity is noted at both lung bases which is most likely combination of metastatic disease and atelectasis. Consider pneumonia if there are consistent clinical findings. Electronically Signed   By: Lajean Manes M.D.   On: 10/14/2017 16:42   Ct Abdomen Pelvis W Contrast  Result Date: 10/14/2017 CLINICAL DATA:  Abdominal and leg swelling. Short of breath since last night. Also with diarrhea. EXAM: CT CHEST, ABDOMEN, AND PELVIS WITH CONTRAST TECHNIQUE:  Multidetector CT imaging of the chest, abdomen and pelvis was performed following the standard protocol during bolus administration of intravenous contrast. CONTRAST:  7mL OMNIPAQUE IOHEXOL 300 MG/ML  SOLN COMPARISON:  Current chest radiograph. Abdomen pelvis CT, 02/07/2013. FINDINGS: CT CHEST FINDINGS Cardiovascular: Heart is normal in size and configuration. Mild left coronary artery **Note De-Identified [place] Obfuscation** calcifications. Great vessels normal in caliber. No aortic dissection. No significant atherosclerosis. Aortic arch branch vessels are widely patent. Mediastinum/Nodes: No enlarged mediastinal, hilar, or axillary lymph nodes. Thyroid gland, trachea, and esophagus demonstrate no significant findings. Lungs/Pleura: There are numerous bilateral pulmonary nodules consistent with widespread metastatic disease. More confluent opacities noted in both lower lobes, consistent with a combination of metastatic disease and atelectasis or, less likely, infection. There are underlying changes of mild centrilobular emphysema. No evidence of pulmonary edema. No pleural effusion or pneumothorax. CT ABDOMEN PELVIS FINDINGS Hepatobiliary: Abnormal appearance of the liver. There is heterogeneous poorly defined mass that involves most of the right lobe, measuring approximately 20 x 14 x 15 cm. This mass shows areas of heterogeneous decreased attenuation with adjacent areas of intermediate and higher attenuation. The liver also demonstrates a nodular contour and relative enlargement of the lateral segment left lobe, findings consistent with cirrhosis. Overall, the appearance is consistent with a large area of a cellular carcinoma in the setting of cirrhosis. Hypoattenuation is seen at the origin of the right portal vein consistent with tumor thrombosis. This appears to extend just into the main portal vein. There is decreased opacification of the superior mesenteric vein which may reflect additional thrombosis or be due to slow flow. The left portal vein  is patent. There is mild thickening and increased enhancement of the wall of the gallbladder most evident along its lower aspect. No convincing gallstone. No bile duct dilation. Pancreas: Unremarkable. No pancreatic ductal dilatation or surrounding inflammatory changes. Spleen: Spleen is enlarged measuring 20 x 6.5 x 13.5 cm. No splenic mass or focal lesion. Adrenals/Urinary Tract: Adrenal glands are unremarkable. Kidneys are normal, without renal calculi, focal lesion, or hydronephrosis. Bladder is unremarkable. Stomach/Bowel: Stomach is unremarkable. No bowel dilation, wall thickening or convincing inflammation. Normal appendix. Vascular/Lymphatic: There are perisplenic, coronary and para soft angio venous collaterals. Aorta demonstrates atherosclerotic disease which extends into the iliac vessels. No aneurysm. 13 mm shotty aortocaval node the level of the left renal vein no other discrete enlarged lymph nodes. Reproductive: Unremarkable. Other: Small to moderate amount of ascites. MUSCULOSKELETAL FINDINGS No acute fracture. Old fracture of L1. No osteoblastic or osteolytic lesions. IMPRESSION: 1. Findings are consistent with a large or multifocal hepatocellular carcinoma involving most of the right liver lobe, with extensive pulmonary metastatic disease. There is a single shotty mildly enlarged aortocaval node which may reflect metastatic adenopathy. Tumor invades and expands the right portal vein with extension into the adjacent main portal vein. The left portal vein remains patent. Questionable thrombus in the superior mesenteric vein. Small to moderate amount of ascites. This may be due to cirrhosis and portal venous hypertension, suspected. Peritoneal metastatic disease is not excluded although not discretely visualized. 2. No other evidence of metastatic disease. 3. 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 4. More confluent type opacity is noted at both lung  bases which is most likely combination of metastatic disease and atelectasis. Consider pneumonia if there are consistent clinical findings. Electronically Signed   By: Lajean Manes M.D.   On: 10/14/2017 16:42   US Paracentesis  Result Date: 10/17/2017 INDICATION: Ascites, cirrhosis, recent low volume paracentesis, continued leakage of fluid from the paracentesis site in the LEFT lower quadrant EXAM: ULTRASOUND GUIDED THERAPEUTIC PARACENTESIS MEDICATIONS: None. COMPLICATIONS: None immediate. PROCEDURE: Procedure, benefits, and risks of procedure were discussed with patient. Written informed consent for procedure was obtained. Time out protocol followed. Adequate collection of ascites localized by ultrasound in RIGHT **Note De-Identified [place] Obfuscation** lower quadrant. Skin prepped and draped in usual sterile fashion. Skin and soft tissues anesthetized with 10 mL of 1% lidocaine. 5 [place] Yueh catheter placed into peritoneal cavity. 3.9 L of yellow ascitic fluid aspirated by vacuum bottle suction. Procedure tolerated well by patient without immediate complication. FINDINGS: As above IMPRESSION: Successful ultrasound-guided paracentesis yielding 3.9 liters of peritoneal fluid. Electronically Signed   By: Lavonia Dana M.D.   On: 10/17/2017 12:20    ASSESSMENT & PLAN:  Liver mass 1.  Highly likely metastatic liver cancer to the lungs: - Presented to the ER on 10/14/2017 with 2-week history of abdominal swelling.  CT scan of the chest, abdomen and pelvis showed large mass in the right lobe measuring 20 x 14 x 15 cm.  Liver also shows nodular contour suggestive of cirrhosis.  Splenomegaly measuring 20 x 6.5 x 13.5 cm.  Numerous bilateral pulmonary nodules consistent with metastasis.  He was also found to have tumor invading and expanding into the right portal vein and extension into the adjacent main portal vein.  Question of old thrombus in the superior mesenteric vein.  Right portal vein also has thrombosis. -he underwent ultrasound-guided  paracentesis with removal of 3.5 L.  He was started on Lovenox 70 mg every 12 hours along with Coumadin 5 mg at bedtime. - He has noticed abdominal distention again today and came to the ER and another 3.9 L of fluid was drained. - Has history of hepatitis C, most likely acquired through remote use of IV drugs. - Heavy alcohol abuse in the past 40 years, 2 pack/day cigarette smoker for the past 40 years. - AFP is elevated at 93,661.  However we need tissue confirmation to initiate any systemic therapy.  We will arrange it through ultrasound-guided biopsy by IR.  We will discuss treatment options after the biopsy is done. - To slow down the reaccumulation of ascites, I have will start him on Lasix 40 mg daily.  We will also start him on spironolactone 25 mg daily and titrated up as needed.  As his potassium is borderline we will also start him on potassium 20 mEq daily.  I have told him to discontinue HCTZ.  2.  Portal vein thrombus: - His INR today is still subtherapeutic at 1.34.  He will continue Coumadin 5 mg daily.  He will continue Lovenox 70 mg every 12 hours.  We will have to hold Lovenox for his biopsy.  Orders Placed This Encounter  Procedures  . CT Biopsy    Standing Status:   Future    Standing Expiration Date:   10/17/2018    Order Specific Question:   Lab orders requested (DO NOT place separate lab orders, these will be automatically ordered during procedure specimen collection):    Answer:   Surgical Pathology    Order Specific Question:   Reason for Exam (SYMPTOM  OR DIAGNOSIS REQUIRED)    Answer:   Liver mass    Order Specific Question:   Preferred imaging location?    Answer:   [facility]    Order Specific Question:   Radiology Contrast Protocol - do NOT remove file path    Answer:   [email or web address]  . US BIOPSY (LIVER)    Standing Status:   Future    Standing Expiration Date:   12/19/2018    Order Specific Question:   Lab orders  requested (DO NOT place separate lab orders, these will be automatically ordered during procedure specimen collection): **Note De-Identified [place] Obfuscation** Answer:   Surgical Pathology    Order Specific Question:   Reason for Exam (SYMPTOM  OR DIAGNOSIS REQUIRED)    Answer:   liver mass    Order Specific Question:   Preferred imaging location?    Answer:   [facility]  . Ambulatory referral to Social Work    Referral Priority:   Routine    Referral Type:   Consultation    Referral Reason:   Specialty Services Required    Number of Visits Requested:   1  . Ambulatory referral to Social Work    Referral Priority:   Routine    Referral Type:   Consultation    Referral Reason:   Specialty Services Required    Number of Visits Requested:   1    All questions were answered. The patient knows to call the clinic with any problems, questions or concerns.     Derek Jack, MD 10/17/2017 5:09 PM

## 2017-10-17 NOTE — ED Notes (Signed)
**Note De-identified [place] Obfuscation** Pt remains in US

## 2017-10-18 ENCOUNTER — Encounter (HOSPITAL_COMMUNITY): Payer: Self-pay | Admitting: General Practice

## 2017-10-18 ENCOUNTER — Encounter: Payer: Self-pay | Admitting: General Practice

## 2017-10-18 NOTE — Progress Notes (Signed)
**Note De-Identified [place] Obfuscation** [place] Psychosocial Distress Screening Clinical Social Work  Clinical Social Work was referred by distress screening protocol.  The patient scored a 10 on the Psychosocial Distress Thermometer which indicates severe distress. Clinical Social Worker contacted patient by phone to assess for distress and other psychosocial needs. "I am running very low on money"; unable to work.  Has applied for disability and Medicaid through DSS today.  Will apply for Food Stamps tomorrow on DSS worker's recommendation.  Patient reports severe financial stress - afraid he will be evicted in two months if he cannot pay his lot rent.  "Any food I have my daughter buys"  Worked 17 years at [employer] - unable to work since mid June due to cancer.  Referred to [facility], referral also made to [facility] for additional support.   ONCBCN DISTRESS SCREENING 10/17/2017  Screening Type Initial Screening  Distress experienced in past week (1-10) 10  Practical problem type Housing;Insurance;Food  Emotional problem type Depression;Nervousness/Anxiety;Adjusting to illness;Isolation/feeling alone;Feeling hopeless;Boredom;Adjusting to appearance changes  Physical Problem type Pain;Nausea/vomiting;Sleep/insomnia;Getting around;Bathing/dressing;Breathing;Constipation/diarrhea;Swollen arms/legs  Physician notified of physical symptoms Yes  Referral to clinical social work Yes    Clinical Social Worker follow up needed: Yes.    If yes, follow up plan: Patient will need periodic follow up to ensure linkage to appropriate resources and supports.  Beverely Pace, [place], LCSW Clinical Social Worker Phone:  [phone]

## 2017-10-19 ENCOUNTER — Telehealth (HOSPITAL_COMMUNITY): Payer: Self-pay | Admitting: *Deleted

## 2017-10-19 ENCOUNTER — Encounter (HOSPITAL_COMMUNITY): Payer: Self-pay | Admitting: General Practice

## 2017-10-19 LAB — CULTURE, BODY FLUID-BOTTLE: CULTURE: NO GROWTH

## 2017-10-19 LAB — CULTURE, BODY FLUID W GRAM STAIN -BOTTLE

## 2017-10-19 NOTE — Telephone Encounter (Signed)
**Note De-Identified [place] Obfuscation** Tasha from central scheduling called and stated that they need a note in Epic stating that the pt has been informed to stop taking his coumadin 4 days prior to the biopsy. In Dr. Marthann Schiller last note it states that the pt is to stop the Lovenox but not the coumadin.   I spoke with Dr. Walden Field and she stated that if it were her she would have stopped the coumadin and continued the lovenox until the day before the biopsy. Due to this, she would prefer Dr. Raliegh Ip to clarify this on Monday.   I have called Tasha back and let her know this and she will get the scheduled biopsy moved to another day.

## 2017-10-23 ENCOUNTER — Encounter (HOSPITAL_COMMUNITY): Payer: Self-pay

## 2017-10-23 ENCOUNTER — Other Ambulatory Visit: Payer: Self-pay

## 2017-10-23 ENCOUNTER — Emergency Department (HOSPITAL_COMMUNITY)
Admission: EM | Admit: 2017-10-23 | Discharge: 2017-10-23 | Disposition: A | Discharge status: Home / Self Care | Payer: Medicaid Other | Attending: Emergency Medicine | Admitting: Emergency Medicine

## 2017-10-23 DIAGNOSIS — S31104A Unspecified open wound of abdominal wall, left lower quadrant without penetration into peritoneal cavity, initial encounter: Secondary | ICD-10-CM | POA: Diagnosis not present

## 2017-10-23 DIAGNOSIS — Z8505 Personal history of malignant neoplasm of liver: Secondary | ICD-10-CM | POA: Diagnosis not present

## 2017-10-23 DIAGNOSIS — Y9389 Activity, other specified: Secondary | ICD-10-CM | POA: Diagnosis not present

## 2017-10-23 DIAGNOSIS — Z85118 Personal history of other malignant neoplasm of bronchus and lung: Secondary | ICD-10-CM | POA: Insufficient documentation

## 2017-10-23 DIAGNOSIS — Z7901 Long term (current) use of anticoagulants: Secondary | ICD-10-CM | POA: Diagnosis not present

## 2017-10-23 DIAGNOSIS — Z79899 Other long term (current) drug therapy: Secondary | ICD-10-CM | POA: Diagnosis not present

## 2017-10-23 DIAGNOSIS — F1020 Alcohol dependence, uncomplicated: Secondary | ICD-10-CM | POA: Insufficient documentation

## 2017-10-23 DIAGNOSIS — F17210 Nicotine dependence, cigarettes, uncomplicated: Secondary | ICD-10-CM | POA: Insufficient documentation

## 2017-10-23 DIAGNOSIS — X58XXXA Exposure to other specified factors, initial encounter: Secondary | ICD-10-CM | POA: Insufficient documentation

## 2017-10-23 DIAGNOSIS — I10 Essential (primary) hypertension: Secondary | ICD-10-CM | POA: Insufficient documentation

## 2017-10-23 DIAGNOSIS — Y998 Other external cause status: Secondary | ICD-10-CM | POA: Diagnosis not present

## 2017-10-23 DIAGNOSIS — J449 Chronic obstructive pulmonary disease, unspecified: Secondary | ICD-10-CM | POA: Diagnosis not present

## 2017-10-23 DIAGNOSIS — S31109A Unspecified open wound of abdominal wall, unspecified quadrant without penetration into peritoneal cavity, initial encounter: Secondary | ICD-10-CM

## 2017-10-23 DIAGNOSIS — Y929 Unspecified place or not applicable: Secondary | ICD-10-CM | POA: Insufficient documentation

## 2017-10-23 LAB — CBC WITH DIFFERENTIAL/PLATELET
Basophils Absolute: 0.1 10*3/uL (ref 0.0–0.1)
Basophils Relative: 1 %
Eosinophils Absolute: 0.2 10*3/uL (ref 0.0–0.7)
Eosinophils Relative: 2 %
HCT: 39.3 % (ref 39.0–52.0)
Hemoglobin: 13.1 g/dL (ref 13.0–17.0)
Lymphocytes Relative: 12 %
Lymphs Abs: 1.3 10*3/uL (ref 0.7–4.0)
MCH: 30.5 pg (ref 26.0–34.0)
MCHC: 33.3 g/dL (ref 30.0–36.0)
MCV: 91.4 fL (ref 78.0–100.0)
Monocytes Absolute: 0.8 10*3/uL (ref 0.1–1.0)
Monocytes Relative: 8 %
Neutro Abs: 8.6 10*3/uL — ABNORMAL HIGH (ref 1.7–7.7)
Neutrophils Relative %: 77 %
Platelets: 170 10*3/uL (ref 150–400)
RBC: 4.3 MIL/uL (ref 4.22–5.81)
RDW: 20.1 % — ABNORMAL HIGH (ref 11.5–15.5)
WBC: 10.9 10*3/uL — ABNORMAL HIGH (ref 4.0–10.5)

## 2017-10-23 LAB — COMPREHENSIVE METABOLIC PANEL
ALT: 48 U/L — ABNORMAL HIGH (ref 0–44)
AST: 303 U/L — ABNORMAL HIGH (ref 15–41)
Albumin: 2.7 g/dL — ABNORMAL LOW (ref 3.5–5.0)
Alkaline Phosphatase: 137 U/L — ABNORMAL HIGH (ref 38–126)
Anion gap: 9 (ref 5–15)
BUN: 16 mg/dL (ref 6–20)
CO2: 27 mmol/L (ref 22–32)
Calcium: 8.7 mg/dL — ABNORMAL LOW (ref 8.9–10.3)
Chloride: 96 mmol/L — ABNORMAL LOW (ref 98–111)
Creatinine, Ser: 0.97 mg/dL (ref 0.61–1.24)
GFR calc Af Amer: >60 mL/min (ref >60)
GFR calc non Af Amer: >60 mL/min (ref >60)
Glucose, Bld: 105 mg/dL — ABNORMAL HIGH (ref 70–99)
Potassium: 3.8 mmol/L (ref 3.5–5.1)
Sodium: 132 mmol/L — ABNORMAL LOW (ref 135–145)
Total Bilirubin: 2 mg/dL — ABNORMAL HIGH (ref 0.3–1.2)
Total Protein: 6.8 g/dL (ref 6.5–8.1)

## 2017-10-23 LAB — PROTIME-INR
INR: 2.19
Prothrombin Time: 24.2 seconds — ABNORMAL HIGH (ref 11.4–15.2)

## 2017-10-23 MED ORDER — LIDOCAINE-EPINEPHRINE (PF) 2 %-1:200000 IJ SOLN
INTRAMUSCULAR | Status: AC
  Administered 2017-10-23: 5 mL
  Filled 2017-10-23: qty 20

## 2017-10-23 NOTE — ED Notes (Signed)
**Note De-Identified [place] Obfuscation** Pt continues to have large amounts of oozing and blood clots from paracentesis site area. Pt states this would be his third trip to ed for bleeding

## 2017-10-23 NOTE — ED Provider Notes (Signed)
**Note De-Identified [place] Obfuscation** [facility] EMERGENCY DEPARTMENT Provider Note   CSN: [id number] Arrival date & time: 10/23/17  1246     History   Chief Complaint Chief Complaint  Patient presents with  . Coagulation Disorder    HPI Keishawn Harden is a 62 y.o. male.  HPI   62 year old male with bleeding from site of recent paracentesis left lower quadrant.  He has been seen in the emergency room recently for the same.  Wound open again he has had persistent wheezing despite pressure dressing.  He has anticoagulated.  Past Medical History:  Diagnosis Date  . Acute renal failure ([place]) 02/07/2013  . Alcoholism ([place])   . Hepatitis C 02/10/2013  . Liver cancer ([place])   . Lung cancer ([place])   . Lytic lesion of bone on x-ray 02/07/2013  . Pericardial effusion 02/07/2013  . UTI (urinary tract infection) 02/07/2013    Patient Active Problem List   Diagnosis Date Noted  . Liver mass 10/17/2017  . Essential hypertension 08/21/2017  . Compression fracture of first lumbar vertebra ([place]) 11/29/2016  . 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 Tobacco use disorder, severe, dependence 11/24/2016  . Skin lesion of face 11/24/2016  . Chest skin lesion 11/24/2016  . Concern about skin cancer without diagnosis 11/24/2016  . Weight loss, non-intentional 11/24/2016  . Chronic cough 11/24/2016  . Alcohol use disorder, moderate, dependence ([place]) 11/24/2016  . 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 Hepatitis C virus infection 02/19/2013  . Ascites 02/10/2013  . Pericardial effusion 02/07/2013  . Lytic lesion of bone on x-ray 02/07/2013  . Liver cirrhosis, alcoholic ([place]) 54/27/0623    Past Surgical History:  Procedure Laterality Date  . ESOPHAGOGASTRODUODENOSCOPY N/A 02/12/2013   Procedure: ESOPHAGOGASTRODUODENOSCOPY (EGD);  Surgeon: Gatha Mayer, MD;  Location: [facility] ENDOSCOPY;  Service: Endoscopy;  Laterality: N/A;        Home Medications    Prior to  Admission medications   Medication Sig Start Date End Date Taking? Authorizing Provider  albuterol (PROVENTIL HFA;VENTOLIN HFA) 108 (90 Base) MCG/ACT inhaler Inhale 2 puffs into the lungs every 6 (six) hours as needed for wheezing or shortness of breath. **Rescue/ Emergency inhaler 12/15/16   Ronnie Doss M, DO  amLODipine (NORVASC) 10 MG tablet Take 1 tablet (10 mg total) by mouth daily. Patient not taking: Reported on 10/17/2017 09/11/17   Ronnie Doss M, DO  enoxaparin (LOVENOX) 150 MG/ML injection Inject 0.47 mLs (70 mg total) into the skin every 12 (twelve) hours for 5 days. 10/14/17 10/19/17  Noemi Chapel, MD  furosemide (LASIX) 20 MG tablet Take 2 tablets (40 mg total) by mouth daily. 10/17/17   Lockamy, Randi L, NP-C  hydrochlorothiazide (HYDRODIURIL) 12.5 MG tablet Take 1 tablet (12.5 mg total) by mouth daily. 09/11/17   Janora Norlander, DO  potassium chloride SA (K-DUR,KLOR-CON) 20 MEQ tablet Take 1 tablet (20 mEq total) by mouth daily. 10/17/17   Lockamy, Randi L, NP-C  spironolactone (ALDACTONE) 25 MG tablet Take 1 tablet (25 mg total) by mouth daily. 10/17/17   Lockamy, Randi L, NP-C  umeclidinium-vilanterol (ANORO ELLIPTA) 62.5-25 MCG/INH AEPB Inhale 1 puff into the lungs daily. **Every day inhaler** 12/15/16   Ronnie Doss M, DO  warfarin (COUMADIN) 5 MG tablet Take 1 tablet (5 mg total) by mouth daily. 10/14/17 08-Dec-2017  Noemi Chapel, MD    Family History Family History  Problem Relation Age of Onset  . Diabetes type II Mother   . Cirrhosis Mother   . **Note De-Identified [place] Obfuscation** Diabetes type II Father   . Alcoholism Father   . Cirrhosis Father   . Cancer Sister   . Cancer Sister   . Diabetes type II Brother   . Cancer Brother   . Pulmonary embolism Daughter   . Bipolar disorder Daughter   . Depression Daughter   . Colon cancer Neg Hx   . Throat cancer Neg Hx   . Heart disease Neg Hx   . Kidney disease Neg Hx     Social History Social History   Tobacco Use  . Smoking status:  Current Every Day Smoker    Packs/day: 0.25    Years: 40.00    Pack years: 10.00    Types: Cigarettes  . Smokeless tobacco: Never Used  Substance Use Topics  . Alcohol use: Not Currently    Alcohol/week: 7.2 oz    Types: 12 Cans of beer per week    Comment: 12 beers daily x 40 years- none in 4 or 5 weeks  . Drug use: No    Types: Marijuana    Comment: has d/c as of 01/2013     Allergies   Patient has no known allergies.   Review of Systems Review of Systems  All systems reviewed and negative, other than as noted in HPI.  Physical Exam Updated Vital Signs BP (!) 137/91 (BP Location: Left Arm)   Pulse (!) 104   Temp 98.7 F (37.1 C) (Oral)   Resp 20   Ht 5\' 7"  (1.702 m)   Wt 73 kg (161 lb)   SpO2 93%   BMI 25.22 kg/m   Physical Exam  Constitutional: He appears well-developed and well-nourished. No distress.  Laying in bed.  Appears chronically ill/cachectic.  Does not seem acutely distressed.  HENT:  Head: Normocephalic.  Chronic appearing wound to right face  Eyes: Conjunctivae are normal. Right eye exhibits no discharge. Left eye exhibits no discharge.  Neck: Neck supple.  Cardiovascular: Regular rhythm and normal heart sounds. Exam reveals no gallop and no friction rub.  No murmur heard. Mild tachycardia  Pulmonary/Chest: Effort normal and breath sounds normal. No respiratory distress.  Abdominal: Soft. He exhibits distension. There is no tenderness.  Pressure bandage wrapped around patient's abdomen with gauze soaked in blood.  Paracentesis site in the left lower quadrant with persistent oozing.  Blood, not ascitic fluid.  Bleeding is easily controlled with simply lightly pinching it and works better than trying to apply direct overlying pressure.  Abdomen is mildly distended.  Not tense.  Soft and no significant tenderness.  Musculoskeletal: He exhibits no edema or tenderness.  Neurological: He is alert.  Skin: Skin is warm and dry.  Psychiatric: He has a  normal mood and affect. His behavior is normal. Thought content normal.  Nursing note and vitals reviewed.    ED Treatments / Results  Labs (all labs ordered are listed, but only abnormal results are displayed) Labs Reviewed  PROTIME-INR - Abnormal; Notable for the following components:      Result Value   Prothrombin Time 24.2 (*)    All other components within normal limits  CBC WITH DIFFERENTIAL/PLATELET - Abnormal; Notable for the following components:   WBC 10.9 (*)    RDW 20.1 (*)    Neutro Abs 8.6 (*)    All other components within normal limits  COMPREHENSIVE METABOLIC PANEL - Abnormal; Notable for the following components:   Sodium 132 (*)    Chloride 96 (*)    Glucose, Bld 105 (*) **Note De-Identified [place] Obfuscation** Calcium 8.7 (*)    Albumin 2.7 (*)    AST 303 (*)    ALT 48 (*)    Alkaline Phosphatase 137 (*)    Total Bilirubin 2.0 (*)    All other components within normal limits    EKG None  Radiology No results found.  Procedures Wound closure utilizing adhes only Date/Time: 10/23/2017 1:10 PM Performed by: Virgel Manifold, MD Authorized by: Virgel Manifold, MD  Consent: Verbal consent obtained. Risks and benefits: risks, benefits and alternatives were discussed Consent given by: patient Patient identity confirmed: verbally with patient and provided demographic data Local anesthesia used: yes Anesthesia: local infiltration  Anesthesia: Local anesthesia used: yes Local Anesthetic: lidocaine 1% with epinephrine Patient tolerance: Patient tolerated the procedure well with no immediate complications Comments: Area was prepped with alcohol swabs.  Lidocaine with epinephrine was carefully injected into subcutaneous tissue in/around the wound. Bleeding slowed with this. Superficial tissue was gentle pinched and bleeding stopped. Area was dried. Dermabond was placed over wound with good hemostasis.     (including critical care time)  Medications Ordered in ED Medications    lidocaine-EPINEPHrine (XYLOCAINE W/EPI) 2 %-1:200000 (PF) injection (has no administration in time range)     Initial Impression / Assessment and Plan / ED Course  I have reviewed the triage vital signs and the nursing notes.  Pertinent labs & imaging results that were available during my care of the patient were reviewed by me and considered in my medical decision making (see chart for details).     62 year old male with persistent bleeding from recent paracentesis wound.  This appears to be blood and not ascitic nor serous fluid.  I carefully injected lidocaine with epinephrine and/around the wound and then applied Dermabond.  Bleeding stopped at this point.  He is on Coumadin and it was just bridged with Lovenox.  Will check CBC and INR.  We will continue to observe his wound while labs are being resulted.  INR in therapeutic range.  He is not anemic.  Platelets fine.  No rebleeding of wound during ER stay. Final Clinical Impressions(s) / ED Diagnoses   Final diagnoses:  Wound of abdomen  Anticoagulated    ED Discharge Orders    None       Virgel Manifold, MD 10/28/17 [phone]

## 2017-10-23 NOTE — ED Triage Notes (Signed)
**Note De-Identified [place] Obfuscation** Pt reports had paracentesis 2 weeks ago and puncture wound started bleeding again today.

## 2017-10-23 NOTE — Discharge Instructions (Addendum)
**Note De-Identified [place] Obfuscation** Try to minimize irritation to the wound the best you can. You may clean it gently with mild soap and warm water but dont scrub.

## 2017-10-24 ENCOUNTER — Ambulatory Visit: Payer: BLUE CROSS/BLUE SHIELD | Admitting: Family Medicine

## 2017-10-25 ENCOUNTER — Ambulatory Visit (HOSPITAL_COMMUNITY): Admission: RE | Admit: 2017-10-25 | Payer: Self-pay | Source: Ambulatory Visit

## 2017-10-29 ENCOUNTER — Other Ambulatory Visit: Payer: Self-pay | Admitting: Radiology

## 2017-10-30 ENCOUNTER — Ambulatory Visit (HOSPITAL_COMMUNITY)
Admission: RE | Admit: 2017-10-30 | Discharge: 2017-10-30 | Disposition: A | Discharge status: Home / Self Care | Payer: Medicaid Other | Source: Ambulatory Visit | Attending: Nurse Practitioner | Admitting: Nurse Practitioner

## 2017-10-30 ENCOUNTER — Other Ambulatory Visit (HOSPITAL_COMMUNITY): Payer: Self-pay | Admitting: Nurse Practitioner

## 2017-10-30 ENCOUNTER — Other Ambulatory Visit: Payer: Self-pay

## 2017-10-30 ENCOUNTER — Encounter (HOSPITAL_COMMUNITY): Payer: Self-pay | Admitting: General Practice

## 2017-10-30 ENCOUNTER — Encounter (HOSPITAL_COMMUNITY): Payer: Self-pay

## 2017-10-30 DIAGNOSIS — R160 Hepatomegaly, not elsewhere classified: Secondary | ICD-10-CM

## 2017-10-30 DIAGNOSIS — R918 Other nonspecific abnormal finding of lung field: Secondary | ICD-10-CM | POA: Insufficient documentation

## 2017-10-30 DIAGNOSIS — F17210 Nicotine dependence, cigarettes, uncomplicated: Secondary | ICD-10-CM | POA: Insufficient documentation

## 2017-10-30 DIAGNOSIS — K7031 Alcoholic cirrhosis of liver with ascites: Secondary | ICD-10-CM | POA: Insufficient documentation

## 2017-10-30 HISTORY — PX: IR PARACENTESIS: IMG2679

## 2017-10-30 LAB — CBC
HCT: 37.8 % — ABNORMAL LOW (ref 39.0–52.0)
Hemoglobin: 12.2 g/dL — ABNORMAL LOW (ref 13.0–17.0)
MCH: 29.8 pg (ref 26.0–34.0)
MCHC: 32.3 g/dL (ref 30.0–36.0)
MCV: 92.2 fL (ref 78.0–100.0)
PLATELETS: 226 10*3/uL (ref 150–400)
RBC: 4.1 MIL/uL — AB (ref 4.22–5.81)
RDW: 20.5 % — AB (ref 11.5–15.5)
WBC: 11.6 10*3/uL — AB (ref 4.0–10.5)

## 2017-10-30 LAB — PROTIME-INR
INR: 1.34
Prothrombin Time: 16.4 seconds — ABNORMAL HIGH (ref 11.4–15.2)

## 2017-10-30 MED ORDER — SODIUM CHLORIDE 0.9 % IV SOLN: INTRAVENOUS | Status: DC

## 2017-10-30 MED ORDER — LIDOCAINE HCL (PF) 2 % IJ SOLN
INTRAMUSCULAR | Status: AC
  Filled 2017-10-30: qty 20

## 2017-10-30 MED ORDER — GADOBENATE DIMEGLUMINE 529 MG/ML IV SOLN
15.0000 mL | Freq: Once | INTRAVENOUS | Status: AC | PRN
  Administered 2017-10-30: 15 mL via INTRAVENOUS

## 2017-10-30 MED ORDER — LIDOCAINE HCL 1 % IJ SOLN
INTRAMUSCULAR | Status: DC | PRN
  Administered 2017-10-30: 10 mL

## 2017-10-30 NOTE — Progress Notes (Signed)
**Note De-Identified K[place]me Obfuscation** [place] CSW Progress Note  Patient accepted by [facility] for additional support w needs in community.  In home assessment completed by program staff.  Have assisted w access to medications.  Edwyna Shell, LCSW Clinical Social Worker Phone:  [phone]

## 2017-10-30 NOTE — Procedures (Signed)
**Note De-Identified [place] Obfuscation** PROCEDURE SUMMARY:  Successful image-guided paracentesis from the right abdomen.  Yielded 5.6 liters of yellow fluid.  No immediate complications.  Patient tolerated well.   Specimen was not sent for labs.  Joaquim Nam PA-C 10/30/2017 3:57 PM

## 2017-10-30 NOTE — H&P (Signed)
**Note De-Identified [place] Obfuscation** Chief Complaint: Patient was seen in consultation today for liver lesion biopsy at the request of Lockamy,Randi L  Referring Physician(s): Dr Richardean Sale  Supervising Physician: Marybelle Killings  Patient Status: [facility] - Out-pt  History of Present Illness: Marc Wilson is a 62 y.o. male   Pt being seen recently for abdominal bloating Worsening diarrhea  Was seen by PCP 1-2 months ago  Paracentesis 10/14/17: APH 3.9 L  REACTIVE MESOTHELIAL CELLS AND  MACROPHAGES PRESENT, NO ATYPIA SEEN.   Previous several  skin lesion biopsy 11/2016: FINAL PATHOLOGIC DIAGNOSIS MICROSCOPIC EXAMINATION AND DIAGNOSIS  A. SKIN, LEFT FOREHEAD, BIOPSY: Seborrheic keratosis, pigmented. B. SKIN, LEFT TEMPLE, BIOPSY: Scar. Deeper sections have been cut. C. SKIN, RIGHT TEMPLE, BIOPSY: Surface of a follicular cyst, infundibular type (epidermoid cyst). Deeper sections have been cut. D. SKIN, RIGHT MALAR CHEEK, BIOPSY: Surface of a follicular cyst, infundibular type (epidermoid cyst). Deeper sections have been cut. E. SKIN, RIGHT UPPER CHEST, BIOPSY: Scar and postinflammatory pigment alteration. Deeper sections have been cut.  CT 04/16/17: IMPRESSION: 1. Findings are consistent with a large or multifocal hepatocellular carcinoma involving most of the right liver lobe, with extensive pulmonary metastatic disease. There is a single shotty mildly enlarged aortocaval node which may reflect metastatic adenopathy. Tumor invades and expands the right portal vein with extension into the adjacent main portal vein. The left portal vein remains patent. Questionable thrombus in the superior mesenteric vein. Small to moderate amount of ascites. This may be due to cirrhosis and portal venous hypertension, suspected. Peritoneal metastatic disease is not excluded although not discretely visualized. 2. No other evidence of metastatic disease. 3. 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 4. More confluent type opacity is noted at both lung bases which is most likely combination of metastatic disease and atelectasis. Consider pneumonia if there are consistent clinical findings  Request for liver lesion biopsy Scheduled now for same LD Coumadin 7/24    Past Medical History:  Diagnosis Date  . Acute renal failure ([place]) 02/07/2013  . Alcoholism ([place])   . Hepatitis C 02/10/2013  . Liver cancer ([place])   . Lung cancer ([place])   . Lytic lesion of bone on x-ray 02/07/2013  . Pericardial effusion 02/07/2013  . UTI (urinary tract infection) 02/07/2013    Past Surgical History:  Procedure Laterality Date  . ESOPHAGOGASTRODUODENOSCOPY N/A 02/12/2013   Procedure: ESOPHAGOGASTRODUODENOSCOPY (EGD);  Surgeon: Gatha Mayer, MD;  Location: [facility] ENDOSCOPY;  Service: Endoscopy;  Laterality: N/A;    Allergies: Patient has no known allergies.  Medications: Prior to Admission medications   Medication Sig Start Date End Date Taking? Authorizing Provider  amLODipine (NORVASC) 10 MG tablet Take 1 tablet (10 mg total) by mouth daily. Patient not taking: Reported on 10/17/2017 09/11/17   Janora Norlander, DO  furosemide (LASIX) 20 MG tablet Take 2 tablets (40 mg total) by mouth daily. 10/17/17   Lockamy, Randi L, NP-C  hydrochlorothiazide (HYDRODIURIL) 12.5 MG tablet Take 1 tablet (12.5 mg total) by mouth daily. 09/11/17   Janora Norlander, DO  potassium chloride SA (K-DUR,KLOR-CON) 20 MEQ tablet Take 1 tablet (20 mEq total) by mouth daily. 10/17/17   Lockamy, Randi L, NP-C  spironolactone (ALDACTONE) 25 MG tablet Take 1 tablet (25 mg total) by mouth daily. 10/17/17   Lockamy, Randi L, NP-C  umeclidinium-vilanterol (ANORO ELLIPTA) 62.5-25 MCG/INH AEPB Inhale 1 puff into the lungs daily. **Every day inhaler** 12/15/16   Ronnie Doss M, DO  warfarin (COUMADIN) 5 **Note De-Identified [place] Obfuscation** MG tablet Take 1 tablet (5 mg total) by mouth daily. 10/14/17 11/29/17  Noemi Chapel, MD     Family History  Problem Relation Age of Onset  . Diabetes type II Mother   . Cirrhosis Mother   . Diabetes type II Father   . Alcoholism Father   . Cirrhosis Father   . Cancer Sister   . Cancer Sister   . Diabetes type II Brother   . Cancer Brother   . Pulmonary embolism Daughter   . Bipolar disorder Daughter   . Depression Daughter   . Colon cancer Neg Hx   . Throat cancer Neg Hx   . Heart disease Neg Hx   . Kidney disease Neg Hx     Social History   Socioeconomic History  . Marital status: Legally Separated    Spouse name: Not on file  . Number of children: 1  . Years of education: Not on file  . Highest education level: 8th grade  Occupational History  . Not on file  Social Needs  . Financial resource strain: Very hard  . Food insecurity:    Worry: Never true    Inability: Often true  . Transportation needs:    Medical: No    Non-medical: No  Tobacco Use  . Smoking status: Current Every Day Smoker    Packs/day: 0.25    Years: 40.00    Pack years: 10.00    Types: Cigarettes  . Smokeless tobacco: Never Used  Substance and Sexual Activity  . Alcohol use: Not Currently    Alcohol/week: 7.2 oz    Types: 12 Cans of beer per week    Comment: 12 beers daily x 40 years- none in 4 or 5 weeks  . Drug use: No    Types: Marijuana    Comment: has d/c as of 01/2013  . Sexual activity: Not on file  Lifestyle  . Physical activity:    Days per week: 0 days    Minutes per session: 0 min  . Stress: Very much  Relationships  . Social connections:    Talks on phone: Once a week    Gets together: Twice a week    Attends religious service: Never    Active member of club or organization: No    Attends meetings of clubs or organizations: Never    Relationship status: Divorced  Other Topics Concern  . Not on file  Social History Narrative   Pt states that "i'm broke and I need all the help I can get". He also states that in 2 months he will likely be  homeless.    Review of Systems: A 12 point ROS discussed and pertinent positives are indicated in the HPI above.  All other systems are negative.  Review of Systems  Constitutional: Positive for activity change and unexpected weight change. Negative for fever.  Respiratory: Negative for cough and shortness of breath.   Gastrointestinal: Negative for abdominal pain.  Skin: Positive for color change.  Neurological: Positive for weakness.  Psychiatric/Behavioral: Negative for behavioral problems and confusion.    Vital Signs: BP 121/81 (BP Location: Right Arm)   Pulse 98   Temp 98.8 F (37.1 C) (Oral)   Ht 5\' 8"  (1.727 m)   Wt 161 lb (73 kg)   SpO2 94%   BMI 24.48 kg/m   Physical Exam  Cardiovascular: Normal rate and regular rhythm.  Pulmonary/Chest: Effort normal and breath sounds normal.  Abdominal: Soft. He exhibits distension. There **Note De-Identified [place]ey Obfuscation** is tenderness.  Musculoskeletal: Normal range of motion.  Neurological: He is alert.  Skin: Skin is warm. There is erythema.  Multiple skin lesions Rt temple area skin lesion-- scabbed area (skin bx 2018) B LE Edema  Psychiatric: He has a normal mood and affect. His behavior is normal. Judgment and thought content normal.  Vitals reviewed.   Imaging: Dg Chest 2 View  Result Date: 10/14/2017 CLINICAL DATA:  Shortness of breath and cough. EXAM: CHEST - 2 VIEW COMPARISON:  11/24/2016 FINDINGS: Heart size appears normal. No pleural effusion identified. There are innumerable pulmonary nodules identified throughout both lungs. These are suspicious for metastatic disease. A larger nodular opacity is identified in the left base measuring 2.4 cm. IMPRESSION: 1. Diffuse pulmonary nodularity worrisome for metastatic disease. Suggest further evaluation with contrast enhanced CT of the chest. Electronically Signed   By: Kerby Moors M.D.   On: 10/14/2017 14:40   Ct Chest W Contrast  Result Date: 10/14/2017 CLINICAL DATA:  Abdominal and leg swelling.  Short of breath since last night. Also with diarrhea. EXAM: CT CHEST, ABDOMEN, AND PELVIS WITH CONTRAST TECHNIQUE: Multidetector CT imaging of the chest, abdomen and pelvis was performed following the standard protocol during bolus administration of intravenous contrast. CONTRAST:  41mL OMNIPAQUE IOHEXOL 300 MG/ML  SOLN COMPARISON:  Current chest radiograph. Abdomen pelvis CT, 02/07/2013. FINDINGS: CT CHEST FINDINGS Cardiovascular: Heart is normal in size and configuration. Mild left coronary artery calcifications. Great vessels normal in caliber. No aortic dissection. No significant atherosclerosis. Aortic arch branch vessels are widely patent. Mediastinum/Nodes: No enlarged mediastinal, hilar, or axillary lymph nodes. Thyroid gland, trachea, and esophagus demonstrate no significant findings. Lungs/Pleura: There are numerous bilateral pulmonary nodules consistent with widespread metastatic disease. More confluent opacities noted in both lower lobes, consistent with a combination of metastatic disease and atelectasis or, less likely, infection. There are underlying changes of mild centrilobular emphysema. No evidence of pulmonary edema. No pleural effusion or pneumothorax. CT ABDOMEN PELVIS FINDINGS Hepatobiliary: Abnormal appearance of the liver. There is heterogeneous poorly defined mass that involves most of the right lobe, measuring approximately 20 x 14 x 15 cm. This mass shows areas of heterogeneous decreased attenuation with adjacent areas of intermediate and higher attenuation. The liver also demonstrates a nodular contour and relative enlargement of the lateral segment left lobe, findings consistent with cirrhosis. Overall, the appearance is consistent with a large area of a cellular carcinoma in the setting of cirrhosis. Hypoattenuation is seen at the origin of the right portal vein consistent with tumor thrombosis. This appears to extend just into the main portal vein. There is decreased opacification of  the superior mesenteric vein which may reflect additional thrombosis or be due to slow flow. The left portal vein is patent. There is mild thickening and increased enhancement of the wall of the gallbladder most evident along its lower aspect. No convincing gallstone. No bile duct dilation. Pancreas: Unremarkable. No pancreatic ductal dilatation or surrounding inflammatory changes. Spleen: Spleen is enlarged measuring 20 x 6.5 x 13.5 cm. No splenic mass or focal lesion. Adrenals/Urinary Tract: Adrenal glands are unremarkable. Kidneys are normal, without renal calculi, focal lesion, or hydronephrosis. Bladder is unremarkable. Stomach/Bowel: Stomach is unremarkable. No bowel dilation, wall thickening or convincing inflammation. Normal appendix. Vascular/Lymphatic: There are perisplenic, coronary and para soft angio venous collaterals. Aorta demonstrates atherosclerotic disease which extends into the iliac vessels. No aneurysm. 13 mm shotty aortocaval node the level of the left renal vein no other discrete enlarged lymph nodes. Reproductive: **Note De-Identified Elling Obfuscation** Unremarkable. Other: Small to moderate amount of ascites. MUSCULOSKELETAL FINDINGS No acute fracture. Old fracture of L1. No osteoblastic or osteolytic lesions. IMPRESSION: 1. Findings are consistent with a large or multifocal hepatocellular carcinoma involving most of the right liver lobe, with extensive pulmonary metastatic disease. There is a single shotty mildly enlarged aortocaval node which may reflect metastatic adenopathy. Tumor invades and expands the right portal vein with extension into the adjacent main portal vein. The left portal vein remains patent. Questionable thrombus in the superior mesenteric vein. Small to moderate amount of ascites. This may be due to cirrhosis and portal venous hypertension, suspected. Peritoneal metastatic disease is not excluded although not discretely visualized. 2. No other evidence of metastatic disease. 3. Cirrhosis with splenomegaly,  ascites and varices reflecting portal venous hypertension. 4. More confluent type opacity is noted at both lung bases which is most likely combination of metastatic disease and atelectasis. Consider pneumonia if there are consistent clinical findings. Electronically Signed   By: Lajean Manes M.D.   On: 10/14/2017 16:42   Ct Abdomen Pelvis W Contrast  Result Date: 10/14/2017 CLINICAL DATA:  Abdominal and leg swelling. Short of breath since last night. Also with diarrhea. EXAM: CT CHEST, ABDOMEN, AND PELVIS WITH CONTRAST TECHNIQUE: Multidetector CT imaging of the chest, abdomen and pelvis was performed following the standard protocol during bolus administration of intravenous contrast. CONTRAST:  93mL OMNIPAQUE IOHEXOL 300 MG/ML  SOLN COMPARISON:  Current chest radiograph. Abdomen pelvis CT, 02/07/2013. FINDINGS: CT CHEST FINDINGS Cardiovascular: Heart is normal in size and configuration. Mild left coronary artery calcifications. Great vessels normal in caliber. No aortic dissection. No significant atherosclerosis. Aortic arch branch vessels are widely patent. Mediastinum/Nodes: No enlarged mediastinal, hilar, or axillary lymph nodes. Thyroid gland, trachea, and esophagus demonstrate no significant findings. Lungs/Pleura: There are numerous bilateral pulmonary nodules consistent with widespread metastatic disease. More confluent opacities noted in both lower lobes, consistent with a combination of metastatic disease and atelectasis or, less likely, infection. There are underlying changes of mild centrilobular emphysema. No evidence of pulmonary edema. No pleural effusion or pneumothorax. CT ABDOMEN PELVIS FINDINGS Hepatobiliary: Abnormal appearance of the liver. There is heterogeneous poorly defined mass that involves most of the right lobe, measuring approximately 20 x 14 x 15 cm. This mass shows areas of heterogeneous decreased attenuation with adjacent areas of intermediate and higher attenuation. The liver  also demonstrates a nodular contour and relative enlargement of the lateral segment left lobe, findings consistent with cirrhosis. Overall, the appearance is consistent with a large area of a cellular carcinoma in the setting of cirrhosis. Hypoattenuation is seen at the origin of the right portal vein consistent with tumor thrombosis. This appears to extend just into the main portal vein. There is decreased opacification of the superior mesenteric vein which may reflect additional thrombosis or be due to slow flow. The left portal vein is patent. There is mild thickening and increased enhancement of the wall of the gallbladder most evident along its lower aspect. No convincing gallstone. No bile duct dilation. Pancreas: Unremarkable. No pancreatic ductal dilatation or surrounding inflammatory changes. Spleen: Spleen is enlarged measuring 20 x 6.5 x 13.5 cm. No splenic mass or focal lesion. Adrenals/Urinary Tract: Adrenal glands are unremarkable. Kidneys are normal, without renal calculi, focal lesion, or hydronephrosis. Bladder is unremarkable. Stomach/Bowel: Stomach is unremarkable. No bowel dilation, wall thickening or convincing inflammation. Normal appendix. Vascular/Lymphatic: There are perisplenic, coronary and para soft angio venous collaterals. Aorta demonstrates atherosclerotic disease which extends into the iliac **Note De-Identified Kerlin Obfuscation** vessels. No aneurysm. 13 mm shotty aortocaval node the level of the left renal vein no other discrete enlarged lymph nodes. Reproductive: Unremarkable. Other: Small to moderate amount of ascites. MUSCULOSKELETAL FINDINGS No acute fracture. Old fracture of L1. No osteoblastic or osteolytic lesions. IMPRESSION: 1. Findings are consistent with a large or multifocal hepatocellular carcinoma involving most of the right liver lobe, with extensive pulmonary metastatic disease. There is a single shotty mildly enlarged aortocaval node which may reflect metastatic adenopathy. Tumor invades and expands the  right portal vein with extension into the adjacent main portal vein. The left portal vein remains patent. Questionable thrombus in the superior mesenteric vein. Small to moderate amount of ascites. This may be due to cirrhosis and portal venous hypertension, suspected. Peritoneal metastatic disease is not excluded although not discretely visualized. 2. No other evidence of metastatic disease. 3. 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 4. More confluent type opacity is noted at both lung bases which is most likely combination of metastatic disease and atelectasis. Consider pneumonia if there are consistent clinical findings. Electronically Signed   By: Lajean Manes M.D.   On: 10/14/2017 16:42   US Paracentesis  Result Date: 10/17/2017 INDICATION: Ascites, cirrhosis, recent low volume paracentesis, continued leakage of fluid from the paracentesis site in the LEFT lower quadrant EXAM: ULTRASOUND GUIDED THERAPEUTIC PARACENTESIS MEDICATIONS: None. COMPLICATIONS: None immediate. PROCEDURE: Procedure, benefits, and risks of procedure were discussed with patient. Written informed consent for procedure was obtained. Time out protocol followed. Adequate collection of ascites localized by ultrasound in RIGHT lower quadrant. Skin prepped and draped in usual sterile fashion. Skin and soft tissues anesthetized with 10 mL of 1% lidocaine. 5 [place] Yueh catheter placed into peritoneal cavity. 3.9 L of yellow ascitic fluid aspirated by vacuum bottle suction. Procedure tolerated well by patient without immediate complication. FINDINGS: As above IMPRESSION: Successful ultrasound-guided paracentesis yielding 3.9 liters of peritoneal fluid. Electronically Signed   By: Lavonia Dana M.D.   On: 10/17/2017 12:20    Labs:  CBC: Recent Labs    10/14/17 1325 10/17/17 1032 10/23/17 1317 10/30/17 1206  WBC 8.5 9.6 10.9* 11.6*  HGB 16.3 15.8 13.1 12.2*  HCT 48.2 47.0 39.3 37.8*    PLT 131* 137* 170 226    COAGS: Recent Labs    10/14/17 1325 10/17/17 1032 10/23/17 1317  INR 1.21 1.34 2.19  APTT 37*  --   --     BMP: Recent Labs    08/21/17 0830 10/14/17 1325 10/17/17 1032 10/23/17 1317  NA 134 135 132* 132*  K 4.3 3.3* 3.6 3.8  CL 98 98 98 96*  CO2 20 26 24 27   GLUCOSE 102* 96 105* 105*  BUN 10 12 16 16   CALCIUM 9.5 8.8* 8.7* 8.7*  CREATININE 0.87 0.85 0.89 0.97  GFRNONAA 94 >60 >60 >60  GFRAA 109 >60 >60 >60    LIVER FUNCTION TESTS: Recent Labs    11/24/16 1116 10/14/17 1325 10/23/17 1317  BILITOT 0.6 2.2* 2.0*  AST 38 363* 303*  ALT 30 48* 48*  ALKPHOS 128* 132* 137*  PROT 7.2 7.5 6.8  ALBUMIN 3.8 3.0* 2.7*    TUMOR MARKERS: No results for input(s): AFPTM, CEA, CA199, CHROMGRNA in the last 8760 hours.  Assessment and Plan:  Liver lesion Ascites Last para 10/14/17: 3.9 L Scheduled for liver lesion biopsy today Risks and benefits discussed with the patient including, but not limited to bleeding, infection, damage to adjacent structures or low yield **Note De-Identified [place] Obfuscation** requiring additional tests.  All of the patient's questions were answered, patient is agreeable to proceed. Consent signed and in chart.  Thank you for this interesting consult.  I greatly enjoyed meeting Marc Wilson and look forward to participating in their care.  A copy of this report was sent to the requesting provider on this date.  Electronically Signed: Lavonia Drafts, PA-C 10/30/2017, 12:31 PM   I spent a total of  30 Minutes   in face to face in clinical consultation, greater than 50% of which was counseling/coordinating care for liver lesion bx

## 2017-10-31 ENCOUNTER — Ambulatory Visit (HOSPITAL_COMMUNITY): Payer: Self-pay

## 2017-11-01 ENCOUNTER — Encounter: Payer: Self-pay | Admitting: *Deleted

## 2017-11-02 ENCOUNTER — Telehealth (HOSPITAL_COMMUNITY): Payer: Self-pay | Admitting: *Deleted

## 2017-11-02 ENCOUNTER — Encounter (HOSPITAL_COMMUNITY): Payer: Self-pay | Admitting: *Deleted

## 2017-11-02 ENCOUNTER — Ambulatory Visit (HOSPITAL_COMMUNITY): Payer: Self-pay | Admitting: Hematology

## 2017-11-02 ENCOUNTER — Emergency Department (HOSPITAL_COMMUNITY): Payer: Medicaid Other

## 2017-11-02 ENCOUNTER — Other Ambulatory Visit: Payer: Self-pay

## 2017-11-02 ENCOUNTER — Inpatient Hospital Stay (HOSPITAL_COMMUNITY)
Admission: EM | Admit: 2017-11-02 | Discharge: 2017-11-07 | DRG: 602 | Disposition: A | Discharge status: Home / Self Care | Payer: Medicaid Other | Attending: Family Medicine | Admitting: Family Medicine

## 2017-11-02 DIAGNOSIS — I10 Essential (primary) hypertension: Secondary | ICD-10-CM | POA: Diagnosis present

## 2017-11-02 DIAGNOSIS — K7011 Alcoholic hepatitis with ascites: Secondary | ICD-10-CM | POA: Diagnosis present

## 2017-11-02 DIAGNOSIS — K7460 Unspecified cirrhosis of liver: Secondary | ICD-10-CM

## 2017-11-02 DIAGNOSIS — R160 Hepatomegaly, not elsewhere classified: Secondary | ICD-10-CM | POA: Diagnosis present

## 2017-11-02 DIAGNOSIS — J439 Emphysema, unspecified: Secondary | ICD-10-CM | POA: Diagnosis present

## 2017-11-02 DIAGNOSIS — J449 Chronic obstructive pulmonary disease, unspecified: Secondary | ICD-10-CM | POA: Diagnosis present

## 2017-11-02 DIAGNOSIS — C220 Liver cell carcinoma: Secondary | ICD-10-CM | POA: Diagnosis present

## 2017-11-02 DIAGNOSIS — Z7901 Long term (current) use of anticoagulants: Secondary | ICD-10-CM

## 2017-11-02 DIAGNOSIS — Z833 Family history of diabetes mellitus: Secondary | ICD-10-CM | POA: Diagnosis not present

## 2017-11-02 DIAGNOSIS — K7030 Alcoholic cirrhosis of liver without ascites: Secondary | ICD-10-CM | POA: Diagnosis present

## 2017-11-02 DIAGNOSIS — F17210 Nicotine dependence, cigarettes, uncomplicated: Secondary | ICD-10-CM | POA: Diagnosis present

## 2017-11-02 DIAGNOSIS — L03116 Cellulitis of left lower limb: Secondary | ICD-10-CM | POA: Diagnosis present

## 2017-11-02 DIAGNOSIS — K7031 Alcoholic cirrhosis of liver with ascites: Secondary | ICD-10-CM | POA: Diagnosis present

## 2017-11-02 DIAGNOSIS — J432 Centrilobular emphysema: Secondary | ICD-10-CM

## 2017-11-02 DIAGNOSIS — F1021 Alcohol dependence, in remission: Secondary | ICD-10-CM | POA: Diagnosis present

## 2017-11-02 DIAGNOSIS — C7800 Secondary malignant neoplasm of unspecified lung: Secondary | ICD-10-CM | POA: Diagnosis present

## 2017-11-02 DIAGNOSIS — M7989 Other specified soft tissue disorders: Secondary | ICD-10-CM | POA: Diagnosis present

## 2017-11-02 DIAGNOSIS — I82409 Acute embolism and thrombosis of unspecified deep veins of unspecified lower extremity: Secondary | ICD-10-CM

## 2017-11-02 DIAGNOSIS — L0390 Cellulitis, unspecified: Secondary | ICD-10-CM | POA: Diagnosis present

## 2017-11-02 DIAGNOSIS — F1020 Alcohol dependence, uncomplicated: Secondary | ICD-10-CM | POA: Diagnosis present

## 2017-11-02 DIAGNOSIS — D72829 Elevated white blood cell count, unspecified: Secondary | ICD-10-CM

## 2017-11-02 DIAGNOSIS — L03115 Cellulitis of right lower limb: Secondary | ICD-10-CM | POA: Diagnosis present

## 2017-11-02 DIAGNOSIS — R188 Other ascites: Secondary | ICD-10-CM

## 2017-11-02 DIAGNOSIS — I81 Portal vein thrombosis: Secondary | ICD-10-CM | POA: Diagnosis present

## 2017-11-02 DIAGNOSIS — B1920 Unspecified viral hepatitis C without hepatic coma: Secondary | ICD-10-CM | POA: Diagnosis present

## 2017-11-02 LAB — COMPREHENSIVE METABOLIC PANEL
ALBUMIN: 2.7 g/dL — AB (ref 3.5–5.0)
ALT: 46 U/L — AB (ref 0–44)
AST: 273 U/L — AB (ref 15–41)
Alkaline Phosphatase: 167 U/L — ABNORMAL HIGH (ref 38–126)
Anion gap: 11 (ref 5–15)
BILIRUBIN TOTAL: 1.9 mg/dL — AB (ref 0.3–1.2)
BUN: 24 mg/dL — AB (ref 6–20)
CHLORIDE: 93 mmol/L — AB (ref 98–111)
CO2: 26 mmol/L (ref 22–32)
CREATININE: 1.2 mg/dL (ref 0.61–1.24)
Calcium: 8.3 mg/dL — ABNORMAL LOW (ref 8.9–10.3)
GFR calc Af Amer: >60 mL/min (ref >60)
GLUCOSE: 108 mg/dL — AB (ref 70–99)
Potassium: 4.4 mmol/L (ref 3.5–5.1)
Sodium: 130 mmol/L — ABNORMAL LOW (ref 135–145)
Total Protein: 7.2 g/dL (ref 6.5–8.1)

## 2017-11-02 LAB — CBC WITH DIFFERENTIAL/PLATELET
Basophils Absolute: 0.1 10*3/uL (ref 0.0–0.1)
Basophils Relative: 0 %
EOS ABS: 0.2 10*3/uL (ref 0.0–0.7)
EOS PCT: 2 %
HCT: 38.6 % — ABNORMAL LOW (ref 39.0–52.0)
Hemoglobin: 12.7 g/dL — ABNORMAL LOW (ref 13.0–17.0)
LYMPHS ABS: 1.2 10*3/uL (ref 0.7–4.0)
LYMPHS PCT: 10 %
MCH: 30 pg (ref 26.0–34.0)
MCHC: 32.9 g/dL (ref 30.0–36.0)
MCV: 91.3 fL (ref 78.0–100.0)
Monocytes Absolute: 1 10*3/uL (ref 0.1–1.0)
Monocytes Relative: 9 %
Neutro Abs: 9.5 10*3/uL — ABNORMAL HIGH (ref 1.7–7.7)
Neutrophils Relative %: 79 %
Platelets: 204 10*3/uL (ref 150–400)
RBC: 4.23 MIL/uL (ref 4.22–5.81)
RDW: 20 % — AB (ref 11.5–15.5)
WBC: 12 10*3/uL — AB (ref 4.0–10.5)

## 2017-11-02 LAB — PROTIME-INR
INR: 1.14
PROTHROMBIN TIME: 14.5 s (ref 11.4–15.2)

## 2017-11-02 LAB — LIPASE, BLOOD: LIPASE: 39 U/L (ref 11–51)

## 2017-11-02 MED ORDER — ONDANSETRON HCL 4 MG PO TABS: 4.0000 mg | ORAL_TABLET | Freq: Four times a day (QID) | ORAL | Status: DC | PRN

## 2017-11-02 MED ORDER — SODIUM CHLORIDE 0.9 % IV BOLUS
500.0000 mL | Freq: Once | INTRAVENOUS | Status: AC
  Administered 2017-11-02: 500 mL via INTRAVENOUS

## 2017-11-02 MED ORDER — CEFAZOLIN SODIUM-DEXTROSE 2-4 GM/100ML-% IV SOLN
2.0000 g | Freq: Three times a day (TID) | INTRAVENOUS | Status: DC
  Administered 2017-11-03 – 2017-11-05 (×8): 2 g via INTRAVENOUS
  Filled 2017-11-02 (×13): qty 100

## 2017-11-02 MED ORDER — SENNOSIDES-DOCUSATE SODIUM 8.6-50 MG PO TABS: 1.0000 | ORAL_TABLET | Freq: Every evening | ORAL | Status: DC | PRN

## 2017-11-02 MED ORDER — HYDROCODONE-ACETAMINOPHEN 5-325 MG PO TABS
1.0000 | ORAL_TABLET | ORAL | Status: DC | PRN
  Administered 2017-11-03 – 2017-11-07 (×15): 2 via ORAL
  Filled 2017-11-02 (×15): qty 2

## 2017-11-02 MED ORDER — VANCOMYCIN HCL IN DEXTROSE 1-5 GM/200ML-% IV SOLN
1000.0000 mg | Freq: Once | INTRAVENOUS | Status: AC
  Administered 2017-11-02: 1000 mg via INTRAVENOUS
  Filled 2017-11-02: qty 200

## 2017-11-02 MED ORDER — HYDROMORPHONE HCL 1 MG/ML IJ SOLN
0.5000 mg | Freq: Once | INTRAMUSCULAR | Status: AC
  Administered 2017-11-02: 0.5 mg via INTRAVENOUS
  Filled 2017-11-02: qty 1

## 2017-11-02 MED ORDER — ACETAMINOPHEN 325 MG PO TABS: 650.0000 mg | ORAL_TABLET | Freq: Four times a day (QID) | ORAL | Status: DC | PRN

## 2017-11-02 MED ORDER — POTASSIUM CHLORIDE CRYS ER 20 MEQ PO TBCR
20.0000 meq | EXTENDED_RELEASE_TABLET | Freq: Every day | ORAL | Status: DC
  Administered 2017-11-04 – 2017-11-07 (×4): 20 meq via ORAL
  Filled 2017-11-02 (×5): qty 1

## 2017-11-02 MED ORDER — ONDANSETRON HCL 4 MG/2ML IJ SOLN
4.0000 mg | Freq: Once | INTRAMUSCULAR | Status: AC
  Administered 2017-11-02: 4 mg via INTRAVENOUS
  Filled 2017-11-02: qty 2

## 2017-11-02 MED ORDER — SODIUM CHLORIDE 0.9 % IV SOLN
INTRAVENOUS | Status: DC
  Administered 2017-11-02 – 2017-11-05 (×6): via INTRAVENOUS

## 2017-11-02 MED ORDER — FUROSEMIDE 40 MG PO TABS
40.0000 mg | ORAL_TABLET | Freq: Every day | ORAL | Status: DC
  Administered 2017-11-04 – 2017-11-07 (×4): 40 mg via ORAL
  Filled 2017-11-02 (×5): qty 1

## 2017-11-02 MED ORDER — SPIRONOLACTONE 25 MG PO TABS
25.0000 mg | ORAL_TABLET | Freq: Every day | ORAL | Status: DC
  Administered 2017-11-04 – 2017-11-07 (×4): 25 mg via ORAL
  Filled 2017-11-02 (×5): qty 1

## 2017-11-02 MED ORDER — ENOXAPARIN SODIUM 120 MG/0.8ML ~~LOC~~ SOLN
1.5000 mg/kg | SUBCUTANEOUS | Status: DC
  Administered 2017-11-02 – 2017-11-05 (×4): 110 mg via SUBCUTANEOUS
  Filled 2017-11-02 (×6): qty 0.8

## 2017-11-02 MED ORDER — ACETAMINOPHEN 650 MG RE SUPP: 650.0000 mg | Freq: Four times a day (QID) | RECTAL | Status: DC | PRN

## 2017-11-02 MED ORDER — UMECLIDINIUM-VILANTEROL 62.5-25 MCG/INH IN AEPB
1.0000 | INHALATION_SPRAY | Freq: Every day | RESPIRATORY_TRACT | Status: DC
  Administered 2017-11-04 – 2017-11-07 (×4): 1 via RESPIRATORY_TRACT
  Filled 2017-11-02: qty 14

## 2017-11-02 MED ORDER — WARFARIN SODIUM 5 MG PO TABS: 5.0000 mg | ORAL_TABLET | Freq: Every day | ORAL | Status: DC

## 2017-11-02 MED ORDER — ONDANSETRON HCL 4 MG/2ML IJ SOLN: 4.0000 mg | Freq: Four times a day (QID) | INTRAMUSCULAR | Status: DC | PRN

## 2017-11-02 NOTE — Progress Notes (Signed)
**Note De-Identified [place]to Obfuscation** ANTICOAGULATION CONSULT NOTE - Initial Consult  Pharmacy Consult for warfarin Indication: VTE  No Known Allergies  Patient Measurements: Height: 5\' 7"  (170.2 cm) Weight: 161 lb (73 kg) IBW/kg (Calculated) : 66.1   Vital Signs: Temp: 98.4 F (36.9 C) (08/02 1401) Temp Source: Oral (08/02 1401) BP: 113/70 (08/02 1401) Pulse Rate: 108 (08/02 1401)  Labs: Recent Labs    11/02/17 1636  HGB 12.7*  HCT 38.6*  PLT 204  LABPROT 14.5  INR 1.14  CREATININE 1.20    Estimated Creatinine Clearance: 62 mL/min (by C-G formula based on SCr of 1.2 mg/dL).   Medical History: Past Medical History:  Diagnosis Date  . Acute renal failure ([place]) 02/07/2013  . Alcoholism ([place])   . Ascites   . Cirrhosis ([place])   . Hepatitis C 02/10/2013  . Liver cancer ([place])   . Lung cancer ([place])   . Lytic lesion of bone on x-ray 02/07/2013  . Pericardial effusion 02/07/2013  . UTI (urinary tract infection) 02/07/2013    Medications:  Medications Prior to Admission  Medication Sig Dispense Refill Last Dose  . furosemide (LASIX) 20 MG tablet Take 2 tablets (40 mg total) by mouth daily. 30 tablet 2 11/02/2017 at Unknown time  . hydrochlorothiazide (HYDRODIURIL) 12.5 MG tablet Take 1 tablet (12.5 mg total) by mouth daily. 30 tablet 1 11/02/2017 at Unknown time  . potassium chloride SA (K-DUR,KLOR-CON) 20 MEQ tablet Take 1 tablet (20 mEq total) by mouth daily. 30 tablet 2 11/02/2017 at Unknown time  . spironolactone (ALDACTONE) 25 MG tablet Take 1 tablet (25 mg total) by mouth daily. 30 tablet 2 11/02/2017 at Unknown time  . umeclidinium-vilanterol (ANORO ELLIPTA) 62.5-25 MCG/INH AEPB Inhale 1 puff into the lungs daily. **Every day inhaler** 1 each 12 11/02/2017 at Unknown time  . warfarin (COUMADIN) 5 MG tablet Take 1 tablet (5 mg total) by mouth daily. 30 tablet 0 11/02/2017 at 10AM  . amLODipine (NORVASC) 10 MG tablet Take 1 tablet (10 mg total) by mouth daily. (Patient not taking: Reported on 10/17/2017) 90 tablet 3  Not Taking at Unknown time    Assessment: Pharmacy consulted to dose warfarin in patient with portal vein thrombosis. Patient will be bridged with lovenox since INR is subtherapeutic on admission  Goal of Therapy:  INR 2-3 Monitor platelets by anticoagulation protocol: Yes   Plan:  Patient already received warfarin today 8/2 so will repeat INR in morning Lovenox 1.5 mg/kg every 24 hours (110 mg every 24 hours)  Ramond Craver 11/02/2017,9:43 PM

## 2017-11-02 NOTE — ED Triage Notes (Signed)
**Note De-Identified [place] Obfuscation** Pt with leg swelling and weeping to left.  Pt with liver CA-no treatment yet.  Pt to see F/U on Tuesday.

## 2017-11-02 NOTE — Telephone Encounter (Signed)
**Note De-Identified [place] Obfuscation** I spoke with the pt's daughter again and she stated that her dad was taken to the ER for evaluation. I advised the pt that if she needed anything to call us. Pt's daughter verbalized understanding.

## 2017-11-02 NOTE — ED Provider Notes (Signed)
**Note De-Identified Dozier Obfuscation** [facility] Provider Note   CSN: [id number] Arrival date & time: 11/02/17  1345     History   Chief Complaint Chief Complaint  Patient presents with  . Leg Swelling    HPI Marc Wilson is a 62 y.o. male.  Patient with diagnosis of large liver mass in July.  Presumed to be hepatocellular carcinoma.  Patient with a long-standing history of alcoholism.  Is no longer drinking now.  Patient definitely known to have metastatic disease to the lung.  And some lytic lesions on bone x-ray.  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  Supposed to be on Coumadin.  Patient states he is taking it.  No active bleeding.  Patient's had some long-standing swelling to his legs mostly around the ankle left greater than right.  In the last few days he has had development of a lot of redness to the ankle proximal foot and distal leg that is now spreading up to around the knee area.  Had some breakdown the skin in the back of the left leg and is oozing clear stuff.  No bleeding.  Family member had contacted hematology oncology they recommend that he come in for evaluation.  Patient was supposed to have interventional radiology do a biopsy for cell confirmation it appears that has not happened yet and there may have been a mixup on that.  Patient's next follow-up is planned with hematology oncology for Tuesday.  Patient is currently not receiving any specific therapy.  He is had to have abdominal ascites fluid drained off twice in the last few weeks.     Past Medical History:  Diagnosis Date  . Acute renal failure ([place]) 02/07/2013  . Alcoholism ([place])   . Ascites   . Cirrhosis ([place])   . Hepatitis C 02/10/2013  . Liver cancer ([place])   . Lung cancer ([place])   . Lytic lesion of bone on x-ray 02/07/2013  . Pericardial effusion 02/07/2013  . UTI (urinary tract infection) 02/07/2013    Patient Active Problem List   Diagnosis Date Noted  . Cellulitis 11/02/2017  . Portal vein  thrombosis 11/02/2017  . Liver mass 10/17/2017  . Essential hypertension 08/21/2017  . Compression fracture of first lumbar vertebra ([place]) 11/29/2016  . 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 Tobacco use disorder, severe, dependence 11/24/2016  . Skin lesion of face 11/24/2016  . Chest skin lesion 11/24/2016  . Concern about skin cancer without diagnosis 11/24/2016  . Weight loss, non-intentional 11/24/2016  . Chronic cough 11/24/2016  . Alcohol use disorder, moderate, dependence ([place]) 11/24/2016  . 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 Hepatitis C virus infection 02/19/2013  . Ascites 02/10/2013  . Pericardial effusion 02/07/2013  . Lytic lesion of bone on x-ray 02/07/2013  . Liver cirrhosis, alcoholic ([place]) 93/23/5573    Past Surgical History:  Procedure Laterality Date  . ESOPHAGOGASTRODUODENOSCOPY N/A 02/12/2013   Procedure: ESOPHAGOGASTRODUODENOSCOPY (EGD);  Surgeon: Gatha Mayer, MD;  Location: [facility] ENDOSCOPY;  Service: Endoscopy;  Laterality: N/A;  . IR PARACENTESIS  10/30/2017        Home Medications    Prior to Admission medications   Medication Sig Start Date End Date Taking? Authorizing Provider  furosemide (LASIX) 20 MG tablet Take 2 tablets (40 mg total) by mouth daily. 10/17/17  Yes Lockamy, Randi L, NP-C  hydrochlorothiazide (HYDRODIURIL) 12.5 MG tablet Take 1 tablet (12.5 mg total) by mouth daily. 09/11/17  Yes Janora Norlander, DO **Note De-Identified Hanser Obfuscation** potassium chloride SA (K-DUR,KLOR-CON) 20 MEQ tablet Take 1 tablet (20 mEq total) by mouth daily. 10/17/17  Yes Lockamy, Randi L, NP-C  spironolactone (ALDACTONE) 25 MG tablet Take 1 tablet (25 mg total) by mouth daily. 10/17/17  Yes Lockamy, Randi L, NP-C  umeclidinium-vilanterol (ANORO ELLIPTA) 62.5-25 MCG/INH AEPB Inhale 1 puff into the lungs daily. **Every day inhaler** 12/15/16  Yes Ronnie Doss M, DO  warfarin (COUMADIN) 5 MG tablet Take 1 tablet (5 mg total) by  mouth daily. 10/14/17 12/08/17 Yes Noemi Chapel, MD  amLODipine (NORVASC) 10 MG tablet Take 1 tablet (10 mg total) by mouth daily. Patient not taking: Reported on 10/17/2017 09/11/17   Janora Norlander, DO    Family History Family History  Problem Relation Age of Onset  . Diabetes type II Mother   . Cirrhosis Mother   . Diabetes type II Father   . Alcoholism Father   . Cirrhosis Father   . Cancer Sister        CLL  . Cancer Sister        gastric carcinoid  . Diabetes type II Brother   . Cancer Brother   . Pulmonary embolism Daughter   . Bipolar disorder Daughter   . Depression Daughter   . Colon cancer Neg Hx   . Throat cancer Neg Hx   . Heart disease Neg Hx   . Kidney disease Neg Hx     Social History Social History   Tobacco Use  . Smoking status: Current Every Day Smoker    Packs/day: 0.25    Years: 40.00    Pack years: 10.00    Types: Cigarettes  . Smokeless tobacco: Never Used  Substance Use Topics  . Alcohol use: Not Currently    Alcohol/week: 7.2 oz    Types: 12 Cans of beer per week    Comment: 12 beers daily x 40 years- none in 4 or 5 weeks  . Drug use: No    Types: Marijuana    Comment: has d/c as of 01/2013     Allergies   Patient has no known allergies.   Review of Systems Review of Systems  Constitutional: Positive for appetite change and unexpected weight change. Negative for fever.  HENT: Negative for congestion.   Respiratory: Negative for shortness of breath.   Cardiovascular: Negative for chest pain.  Gastrointestinal: Positive for abdominal distention.  Genitourinary: Negative for dysuria and hematuria.  Musculoskeletal: Negative for back pain.  Skin: Positive for rash and wound.  Neurological: Negative for headaches.  Hematological: Bruises/bleeds easily.  Psychiatric/Behavioral: Negative for confusion.     Physical Exam Updated Vital Signs BP 113/70 (BP Location: Right Arm)   Pulse (!) 108   Temp 98.4 F (36.9 C) (Oral)    Resp 18   Ht 1.702 m (5\' 7" )   Wt 73 kg (161 lb)   SpO2 93%   BMI 25.22 kg/m   Physical Exam  Constitutional: He is oriented to person, place, and time. He appears well-developed and well-nourished. No distress.  HENT:  Head: Normocephalic and atraumatic.  Mouth/Throat: Oropharynx is clear and moist.  Patient with a skin lesion to the right temporal area measuring about 4 cm circular in nature.  Patient states is been a long time.  Eyes: Pupils are equal, round, and reactive to light. Conjunctivae and EOM are normal.  Neck: Neck supple.  Cardiovascular: Normal rate, regular rhythm and normal heart sounds.  Pulmonary/Chest: Effort normal and breath sounds normal. No respiratory distress. **Note De-Identified Strout Obfuscation** Abdominal: Soft. Bowel sounds are normal. He exhibits distension. There is no tenderness.  Musculoskeletal: Normal range of motion. He exhibits edema and tenderness.  Right leg normal.  Left leg with swelling and erythema from the knee below.  More distal leg erythema is more deep red.  Also involves proximal foot.  Good cap refill to toes.  Tenderness to touch.  No crepitance.  Some small skin tears to the back part distal part of the leg where the skin just due to the edema has opened up some no active bleeding.  Just clear fluid.  The skin to this area of the leg and proximal foot is warm to touch compared to right leg.  Neurological: He is alert and oriented to person, place, and time. No cranial nerve deficit or sensory deficit. Coordination normal.  Skin: Skin is warm. Capillary refill takes less than 2 seconds.  Nursing note and vitals reviewed.    ED Treatments / Results  Labs (all labs ordered are listed, but only abnormal results are displayed) Labs Reviewed  COMPREHENSIVE METABOLIC PANEL - Abnormal; Notable for the following components:      Result Value   Sodium 130 (*)    Chloride 93 (*)    Glucose, Bld 108 (*)    BUN 24 (*)    Calcium 8.3 (*)    Albumin 2.7 (*)    AST 273 (*)     ALT 46 (*)    Alkaline Phosphatase 167 (*)    Total Bilirubin 1.9 (*)    All other components within normal limits  CBC WITH DIFFERENTIAL/PLATELET - Abnormal; Notable for the following components:   WBC 12.0 (*)    Hemoglobin 12.7 (*)    HCT 38.6 (*)    RDW 20.0 (*)    Neutro Abs 9.5 (*)    All other components within normal limits  CULTURE, BLOOD (ROUTINE X 2)  CULTURE, BLOOD (ROUTINE X 2)  LIPASE, BLOOD  PROTIME-INR    EKG None  Radiology Dg Chest Port 1 View  Result Date: 11/02/2017 CLINICAL DATA:  Metastatic hepatocellular carcinoma EXAM: PORTABLE CHEST 1 VIEW COMPARISON:  Chest CT 10/14/2017 FINDINGS: Innumerable nodules throughout both lungs are again demonstrated, consistent with diffuse pulmonary metastatic disease. No superimposed consolidation. No pleural effusion or pneumothorax. There is shallow lung inflation. Cardiomediastinal contours are normal. IMPRESSION: Diffuse nodular pulmonary metastatic disease. Electronically Signed   By: Ulyses Jarred M.D.   On: 11/02/2017 16:52    Procedures Procedures (including critical care time)  Medications Ordered in ED Medications  0.9 %  sodium chloride infusion ( Intravenous New Bag/Given 11/02/17 1648)  ceFAZolin (ANCEF) IVPB 2g/100 mL premix (has no administration in time range)  sodium chloride 0.9 % bolus 500 mL (500 mLs Intravenous New Bag/Given 11/02/17 1648)  HYDROmorphone (DILAUDID) injection 0.5 mg (0.5 mg Intravenous Given 11/02/17 1649)  ondansetron (ZOFRAN) injection 4 mg (4 mg Intravenous Given 11/02/17 1649)  vancomycin (VANCOCIN) IVPB 1000 mg/200 mL premix (1,000 mg Intravenous New Bag/Given 11/02/17 1648)     Initial Impression / Assessment and Plan / ED Course  I have reviewed the triage vital signs and the nursing notes.  Pertinent labs & imaging results that were available during my care of the patient were reviewed by me and considered in my medical decision making (see chart for details).     Symptoms seem to  be consistent with a progressing left leg cellulitis.  Patient also at risk for DVT does have some increased swelling to **Note De-Identified Busker Obfuscation** the left leg compared to right patient states that some of its been long-term.  Patient is to be on Coumadin but INR is subtherapeutic.  Patient may require Lovenox bridge in the meantime he was started on anticoagulation therapy because of the hepatic portal vein thrombosis.  Patient started here on vancomycin initially with concerns for possible MRSA.  But no confirmation of MRSA.  Discussed with hospitalist they will admit.  White count up a little bit compared to the end of July was 12,000.  Patient without fevers is not toxic.  Lungs show metastatic disease to the lung area.  Abdomen also shows ascites on exam nontender no concerns for spontaneous bacterial peritonitis.  Final Clinical Impressions(s) / ED Diagnoses   Final diagnoses:  Cellulitis of left lower extremity  Hepatocellular carcinoma metastatic to lung, unspecified laterality [facility])    ED Discharge Orders    None       Fredia Sorrow, MD 11/02/17 [phone]

## 2017-11-02 NOTE — Progress Notes (Signed)
**Note De-Identified Selner Obfuscation** FMLA papers completed and faxed to [facility].

## 2017-11-02 NOTE — H&P (Signed)
**Note De-Identified [place]ell Obfuscation** History and Physical  Marc Wilson ZOX:[id number] DOB: 1955-06-08 DOA: 11/02/2017  Referring physician: Dr Rogene Houston, ED physician PCP: Patient, No Pcp Per  Outpatient Specialists:   Patient Coming From: home  Chief Complaint: Left leg swelling, redness, pain  HPI: Marc Wilson is a 62 y.o. male with a history of cirrhosis, hepatitis C, history of alcoholism in remission, hypertension.  Patient has a recent diagnosis of liver mass with suspicion of hepatic carcinoma with metastatic disease and portal vein thrombosis.  Patient has been on Coumadin, which was recently stopped as the patient was supposed to have a liver biopsy on 7/30.  An MRI was done in lieu of the liver biopsy, which was suggestive of hepatic carcinoma.  Patient was supposed to restart his Coumadin.  Patient is here for left leg pain which started on Tuesday.  This has increased over the past few days.  Yesterday the patient began to have erythema that is spread up his foot onto his anterior leg.  There is also some swelling.  The leg is very tender to touch.  Pain is nonradiating, but it is worsening.  No palliating or provoking factors.  Emergency Department Course: Patient has a white count of 12.  INR is 1.14.  Patient received vancomycin in the emergency department  Review of Systems:   Pt denies any fevers, chills, nausea, vomiting, diarrhea, constipation, abdominal pain, shortness of breath, dyspnea on exertion, orthopnea, cough, wheezing, palpitations, headache, vision changes, lightheadedness, dizziness, melena, rectal bleeding.  Review of systems are otherwise negative  Past Medical History:  Diagnosis Date  . Acute renal failure ([place]) 02/07/2013  . Alcoholism ([place])   . Ascites   . Cirrhosis ([place])   . Hepatitis C 02/10/2013  . Liver cancer ([place])   . Lung cancer ([place])   . Lytic lesion of bone on x-ray 02/07/2013  . Pericardial effusion 02/07/2013  . UTI (urinary tract infection) 02/07/2013   Past Surgical  History:  Procedure Laterality Date  . ESOPHAGOGASTRODUODENOSCOPY N/A 02/12/2013   Procedure: ESOPHAGOGASTRODUODENOSCOPY (EGD);  Surgeon: Gatha Mayer, MD;  Location: [facility] ENDOSCOPY;  Service: Endoscopy;  Laterality: N/A;  . IR PARACENTESIS  10/30/2017   Social History:  reports that he has been smoking cigarettes.  He has a 10.00 pack-year smoking history. He has never used smokeless tobacco. He reports that he drank about 7.2 oz of alcohol per week. He reports that he does not use drugs. Patient lives at home  No Known Allergies  Family History  Problem Relation Age of Onset  . Diabetes type II Mother   . Cirrhosis Mother   . Diabetes type II Father   . Alcoholism Father   . Cirrhosis Father   . Cancer Sister        CLL  . Cancer Sister        gastric carcinoid  . Diabetes type II Brother   . Cancer Brother   . Pulmonary embolism Daughter   . Bipolar disorder Daughter   . Depression Daughter   . Colon cancer Neg Hx   . Throat cancer Neg Hx   . Heart disease Neg Hx   . Kidney disease Neg Hx       Prior to Admission medications   Medication Sig Start Date End Date Taking? Authorizing Provider  furosemide (LASIX) 20 MG tablet Take 2 tablets (40 mg total) by mouth daily. 10/17/17  Yes Lockamy, Randi L, NP-C  hydrochlorothiazide (HYDRODIURIL) 12.5 MG tablet Take 1 tablet (12.5 mg **Note De-Identified Lichtenwalner Obfuscation** total) by mouth daily. 09/11/17  Yes Gottschalk, Leatrice Jewels M, DO  potassium chloride SA (K-DUR,KLOR-CON) 20 MEQ tablet Take 1 tablet (20 mEq total) by mouth daily. 10/17/17  Yes Lockamy, Randi L, NP-C  spironolactone (ALDACTONE) 25 MG tablet Take 1 tablet (25 mg total) by mouth daily. 10/17/17  Yes Lockamy, Randi L, NP-C  umeclidinium-vilanterol (ANORO ELLIPTA) 62.5-25 MCG/INH AEPB Inhale 1 puff into the lungs daily. **Every day inhaler** 12/15/16  Yes Ronnie Doss M, DO  warfarin (COUMADIN) 5 MG tablet Take 1 tablet (5 mg total) by mouth daily. 10/14/17 2017/11/21 Yes Noemi Chapel, MD  amLODipine (NORVASC) 10  MG tablet Take 1 tablet (10 mg total) by mouth daily. Patient not taking: Reported on 10/17/2017 09/11/17   Janora Norlander, DO    Physical Exam: BP 113/70 (BP Location: Right Arm)   Pulse (!) 108   Temp 98.4 F (36.9 C) (Oral)   Resp 18   Ht 5\' 7"  (1.702 m)   Wt 73 kg (161 lb)   SpO2 93%   BMI 25.22 kg/m   . General: Middle-aged Caucasian male who appears older than stated age. Awake and alert and oriented x3. No acute cardiopulmonary distress.  Marland Kitchen HEENT: Normocephalic atraumatic.  Right and left ears normal in appearance.  Pupils equal, round, reactive to light. Extraocular muscles are intact. Sclerae anicteric and noninjected.  Moist mucosal membranes. No mucosal lesions.  . Neck: Neck supple without lymphadenopathy. No carotid bruits. No masses palpated.  . Cardiovascular: Regular rate with normal S1-S2 sounds. No murmurs, rubs, gallops auscultated. No JVD.  Marland Kitchen Respiratory: Good respiratory effort with no wheezes, rales, rhonchi. Lungs clear to auscultation bilaterally.  No accessory muscle use. . Abdomen: Soft, nontender.  There is a moderate amount of distention.  Active bowel sounds. No masses or hepatosplenomegaly  . Skin: There is a large eschar on the patient's right temple.  Patient has multiple nodules on the skin.  Left lower leg erythematous from the mid tibia distal third midfoot.  There is erythema and swelling.  It is tender to the touch.  No rashes, lesions, or ulcerations.  Dry, warm to touch. 2+ dorsalis pedis and radial pulses. . Musculoskeletal: No calf or leg pain. All major joints not erythematous nontender.  No upper or lower joint deformation.  Good ROM.  No contractures  . Psychiatric: Intact judgment and insight. Pleasant and cooperative. . Neurologic: No focal neurological deficits. Strength is 5/5 and symmetric in upper and lower extremities.  Cranial nerves II through XII are grossly intact.           Labs on Admission: I have personally reviewed following  labs and imaging studies  CBC: Recent Labs  Lab 10/30/17 1206 11/02/17 1636  WBC 11.6* 12.0*  NEUTROABS  --  9.5*  HGB 12.2* 12.7*  HCT 37.8* 38.6*  MCV 92.2 91.3  PLT 226 540   Basic Metabolic Panel: Recent Labs  Lab 11/02/17 1636  NA 130*  K 4.4  CL 93*  CO2 26  GLUCOSE 108*  BUN 24*  CREATININE 1.20  CALCIUM 8.3*   GFR: Estimated Creatinine Clearance: 62 mL/min (by C-G formula based on SCr of 1.2 mg/dL). Liver Function Tests: Recent Labs  Lab 11/02/17 1636  AST 273*  ALT 46*  ALKPHOS 167*  BILITOT 1.9*  PROT 7.2  ALBUMIN 2.7*   Recent Labs  Lab 11/02/17 1636  LIPASE 39   No results for input(s): AMMONIA in the last 168 hours. Coagulation Profile: Recent Labs **Note De-Identified Rickenbach Obfuscation** Lab 10/30/17 1206 11/02/17 1636  INR 1.34 1.14   Cardiac Enzymes: No results for input(s): CKTOTAL, CKMB, CKMBINDEX, TROPONINI in the last 168 hours. BNP (last 3 results) No results for input(s): PROBNP in the last 8760 hours. HbA1C: No results for input(s): HGBA1C in the last 72 hours. CBG: No results for input(s): GLUCAP in the last 168 hours. Lipid Profile: No results for input(s): CHOL, HDL, LDLCALC, TRIG, CHOLHDL, LDLDIRECT in the last 72 hours. Thyroid Function Tests: No results for input(s): TSH, T4TOTAL, FREET4, T3FREE, THYROIDAB in the last 72 hours. Anemia Panel: No results for input(s): VITAMINB12, FOLATE, FERRITIN, TIBC, IRON, RETICCTPCT in the last 72 hours. Urine analysis:    Component Value Date/Time   COLORURINE BROWN (A) 02/07/2013 1730   APPEARANCEUR CLOUDY (A) 02/07/2013 1730   LABSPEC >1.030 (H) 02/07/2013 1730   PHURINE 5.0 02/07/2013 1730   GLUCOSEU NEGATIVE 02/07/2013 1730   HGBUR NEGATIVE 02/07/2013 1730   BILIRUBINUR NEGATIVE 02/07/2013 1730   KETONESUR TRACE (A) 02/07/2013 1730   PROTEINUR 30 (A) 02/07/2013 1730   UROBILINOGEN 4.0 (H) 02/07/2013 1730   NITRITE POSITIVE (A) 02/07/2013 1730   LEUKOCYTESUR NEGATIVE 02/07/2013 1730   Sepsis  Labs: @LABRCNTIP (procalcitonin:4,lacticidven:4) )No results found for this or any previous visit (from the past 240 hour(s)).   Radiological Exams on Admission: Dg Chest Port 1 View  Result Date: 11/02/2017 CLINICAL DATA:  Metastatic hepatocellular carcinoma EXAM: PORTABLE CHEST 1 VIEW COMPARISON:  Chest CT 10/14/2017 FINDINGS: Innumerable nodules throughout both lungs are again demonstrated, consistent with diffuse pulmonary metastatic disease. No superimposed consolidation. No pleural effusion or pneumothorax. There is shallow lung inflation. Cardiomediastinal contours are normal. IMPRESSION: Diffuse nodular pulmonary metastatic disease. Electronically Signed   By: Ulyses Jarred M.D.   On: 11/02/2017 16:52      Assessment/Plan: Principal Problem:   Cellulitis Active Problems:   Liver cirrhosis, alcoholic (HCC)   Ascites   Hepatitis C virus infection   Alcohol use disorder, moderate, dependence (HCC)   COPD (chronic obstructive pulmonary disease) with emphysema ([place])   Essential hypertension   Liver mass    This patient was discussed with the ED physician, including pertinent vitals, physical exam findings, labs, and imaging.  We also discussed care given by the ED provider.  1. Cellulitis a. Obtain blood cultures b. Start Ancef c. Screen for MRSA d. Patient will need to stay at least 48 hours for blood cultures to return and for improvement of the patient's cellulitis e. Check CBC in the morning 2. Cirrhosis a. Patient has stopped drinking alcohol b. Continues to have some elevated transaminases 3. Hepatitis C 4. Alcoholism a. Not drinking at this point 5. Essential hypertension a. Continue antihypertensives 6. Liver mass a. Cytology negative b. MRI done -suggestive of hepatic carcinoma c. Patient has follow-up with oncology on Tuesday 7. Portal vein thrombosis a. Patient subtherapeutic b. Full dose Lovenox c. Start Coumadin   DVT prophylaxis: Lovenox/Coumadin  bridge Consultants: None Code Status: Full code Family Communication: Sister present during interview and exam Disposition Plan: Patient to return home following clinical improvement   Sande Pickert [place] [facility] Pager [phone]  If 7PM-7AM, please contact night-coverage [email or web address] Password [email or web address]

## 2017-11-02 NOTE — Telephone Encounter (Signed)
**Note De-Identified [place] Obfuscation** I spoke with the pt's daughter about the pt restarting his coumadin. I left a message yesterday as well advising him to restart the coumadin. His daughter stated that his legs are "busted open and leaking fluid" and that "he goes from his hospital bed to his potty chair and that's it". I advised that the pt need to go to the ER for evaluation and she states that the pt is refusing and doesn't want to go. I advised her that if things get worse she will need to try to make him go, she verbalized understanding. She stated that she would call us back if there are any changes.

## 2017-11-03 ENCOUNTER — Inpatient Hospital Stay (HOSPITAL_COMMUNITY): Payer: Medicaid Other

## 2017-11-03 LAB — BASIC METABOLIC PANEL
ANION GAP: 9 (ref 5–15)
BUN: 23 mg/dL — AB (ref 6–20)
CALCIUM: 7.8 mg/dL — AB (ref 8.9–10.3)
CO2: 25 mmol/L (ref 22–32)
CREATININE: 1.16 mg/dL (ref 0.61–1.24)
Chloride: 95 mmol/L — ABNORMAL LOW (ref 98–111)
GFR calc Af Amer: >60 mL/min (ref >60)
Glucose, Bld: 105 mg/dL — ABNORMAL HIGH (ref 70–99)
Potassium: 4 mmol/L (ref 3.5–5.1)
Sodium: 129 mmol/L — ABNORMAL LOW (ref 135–145)

## 2017-11-03 LAB — CBC
HCT: 35.2 % — ABNORMAL LOW (ref 39.0–52.0)
Hemoglobin: 11.3 g/dL — ABNORMAL LOW (ref 13.0–17.0)
MCH: 29.4 pg (ref 26.0–34.0)
MCHC: 32.1 g/dL (ref 30.0–36.0)
MCV: 91.4 fL (ref 78.0–100.0)
PLATELETS: 204 10*3/uL (ref 150–400)
RBC: 3.85 MIL/uL — ABNORMAL LOW (ref 4.22–5.81)
RDW: 19.8 % — AB (ref 11.5–15.5)
WBC: 10.1 10*3/uL (ref 4.0–10.5)

## 2017-11-03 LAB — PROTIME-INR
INR: 1.27
Prothrombin Time: 15.8 seconds — ABNORMAL HIGH (ref 11.4–15.2)

## 2017-11-03 MED ORDER — WARFARIN SODIUM 5 MG PO TABS
5.0000 mg | ORAL_TABLET | Freq: Once | ORAL | Status: AC
  Administered 2017-11-03: 5 mg via ORAL
  Filled 2017-11-03: qty 1

## 2017-11-03 MED ORDER — WARFARIN - PHARMACIST DOSING INPATIENT
Freq: Every day | Status: DC
  Administered 2017-11-03 – 2017-11-06 (×4)

## 2017-11-03 NOTE — Progress Notes (Signed)
**Note De-Identified [place] Obfuscation** PROGRESS NOTE    Marc Wilson  IWP:[id number] DOB: October 27, 1955 DOA: 11/02/2017 PCP: Patient, No Pcp Per    Brief Narrative:  62 y/o male with history of cirrhosis, alcoholism, HepC, liver mass and portal vein thrombosis was admitted to the hospital with right lower leg cellulitis. He was admitted for IV antibiotics.   Assessment & Plan:   Principal Problem:   Cellulitis Active Problems:   Liver cirrhosis, alcoholic (HCC)   Ascites   Hepatitis C virus infection   Alcohol use disorder, moderate, dependence (HCC)   COPD (chronic obstructive pulmonary disease) with emphysema (HCC)   Essential hypertension   Liver mass   Portal vein thrombosis  1. Right lower leg cellulitis. Started on intravenous ancef. Will keep lower extremity elevated. Venous doppler negative for DVT. Patient feels his leg is mildly better today 2. Cirrhosis, alcoholic. Patient has stopped drinking alcohol 3. Hepatitis C 4. HTN. Blood pressure stable. Chronically on amlodipine and HCTZ which were held on admission. Currently normotensive 5. Liver mass. MRI suggestive of hepatic carcinoma. Patient has scheduled oncology evaluation on 8/6 6. Portal vein thrombosis. Patient is chronically on coumadin, but this had been held for possible liver biopsy. Patient did not have biopsy done and coumadin was resumed, but INR still subtherapeutic. He is lovenox bridge. Family is familiar with giving lovenox injections at home if needed   DVT prophylaxis: coumadin/lovenox Code Status: full code Family Communication: discussed with family members at the bedside Disposition Plan: discharge home once improved   Consultants:     Procedures:     Antimicrobials:   Ancef 8/2>   Subjective: Continues to have pain and swelling as well as erythema of right lower leg  Objective: Vitals:   11/02/17 1356 11/02/17 1401 11/03/17 0612 11/03/17 1248  BP:  113/70 120/81 119/80  Pulse:  (!) 108 94 92  Resp:  18 18 17     Temp:  98.4 F (36.9 C) 97.7 F (36.5 C) 98.7 F (37.1 C)  TempSrc:  Oral Oral Oral  SpO2:  93% 93% 94%  Weight: 73 kg (161 lb) 73 kg (161 lb)    Height: 5\' 7"  (1.702 m) 5\' 7"  (1.702 m)      Intake/Output Summary (Last 24 hours) at 11/03/2017 1505 Last data filed at 11/02/2017 1821 Gross per 24 hour  Intake 700 ml  Output -  Net 700 ml   Filed Weights   11/02/17 1356 11/02/17 1401  Weight: 73 kg (161 lb) 73 kg (161 lb)    Examination:  General exam: Appears calm and comfortable  Respiratory system: Clear to auscultation. Respiratory effort normal. Cardiovascular system: S1 & S2 heard, RRR. No JVD, murmurs, rubs, gallops or clicks. No pedal edema. Gastrointestinal system: Abdomen is nondistended, soft and nontender. No organomegaly or masses felt. Normal bowel sounds heard. Central nervous system: Alert and oriented. No focal neurological deficits. Extremities: Symmetric 5 x 5 power. Skin: erythema and swelling of right lower leg Psychiatry: Judgement and insight appear normal. Mood & affect appropriate.     Data Reviewed: I have personally reviewed following labs and imaging studies  CBC: Recent Labs  Lab 10/30/17 1206 11/02/17 1636 11/03/17 0547  WBC 11.6* 12.0* 10.1  NEUTROABS  --  9.5*  --   HGB 12.2* 12.7* 11.3*  HCT 37.8* 38.6* 35.2*  MCV 92.2 91.3 91.4  PLT 226 204 505   Basic Metabolic Panel: Recent Labs  Lab 11/02/17 1636 11/03/17 0547  NA 130* 129*  K 4.4 4.0 **Note De-Identified [place] Obfuscation** CL 93* 95*  CO2 26 25  GLUCOSE 108* 105*  BUN 24* 23*  CREATININE 1.20 1.16  CALCIUM 8.3* 7.8*   GFR: Estimated Creatinine Clearance: 64.1 mL/min (by C-G formula based on SCr of 1.16 mg/dL). Liver Function Tests: Recent Labs  Lab 11/02/17 1636  AST 273*  ALT 46*  ALKPHOS 167*  BILITOT 1.9*  PROT 7.2  ALBUMIN 2.7*   Recent Labs  Lab 11/02/17 1636  LIPASE 39   No results for input(s): AMMONIA in the last 168 hours. Coagulation Profile: Recent Labs  Lab 10/30/17 1206  11/02/17 1636 11/03/17 0547  INR 1.34 1.14 1.27   Cardiac Enzymes: No results for input(s): CKTOTAL, CKMB, CKMBINDEX, TROPONINI in the last 168 hours. BNP (last 3 results) No results for input(s): PROBNP in the last 8760 hours. HbA1C: No results for input(s): HGBA1C in the last 72 hours. CBG: No results for input(s): GLUCAP in the last 168 hours. Lipid Profile: No results for input(s): CHOL, HDL, LDLCALC, TRIG, CHOLHDL, LDLDIRECT in the last 72 hours. Thyroid Function Tests: No results for input(s): TSH, T4TOTAL, FREET4, T3FREE, THYROIDAB in the last 72 hours. Anemia Panel: No results for input(s): VITAMINB12, FOLATE, FERRITIN, TIBC, IRON, RETICCTPCT in the last 72 hours. Sepsis Labs: No results for input(s): PROCALCITON, LATICACIDVEN in the last 168 hours.  Recent Results (from the past 240 hour(s))  Culture, blood (routine x 2)     Status: None (Preliminary result)   Collection Time: 11/02/17  5:44 PM  Result Value Ref Range Status   Specimen Description BLOOD LEFT ARM  Final   Special Requests   Final    BOTTLES DRAWN AEROBIC AND ANAEROBIC Blood Culture adequate volume   Culture   Final    NO GROWTH < 12 HOURS Performed at [facility], [address], [place], [place] [address]    Report Status PENDING  Incomplete  Culture, blood (routine x 2)     Status: None (Preliminary result)   Collection Time: 11/02/17  5:51 PM  Result Value Ref Range Status   Specimen Description BLOOD RIGHT HAND  Final   Special Requests   Final    BOTTLES DRAWN AEROBIC ONLY Blood Culture results may not be optimal due to an inadequate volume of blood received in culture bottles   Culture   Final    NO GROWTH < 12 HOURS Performed at [facility], [address], [place], [place] [address]    Report Status PENDING  Incomplete         Radiology Studies: US Venous Img Lower Unilateral Left  Result Date: 11/03/2017 CLINICAL DATA:  62 year old male with left lower extremity redness for the  past 4 days EXAM: LEFT LOWER EXTREMITY VENOUS DOPPLER ULTRASOUND TECHNIQUE: Gray-scale sonography with graded compression, as well as color Doppler and duplex ultrasound were performed to evaluate the lower extremity deep venous systems from the level of the common femoral vein and including the common femoral, femoral, profunda femoral, popliteal and calf veins including the posterior tibial, peroneal and gastrocnemius veins when visible. The superficial great saphenous vein was also interrogated. Spectral Doppler was utilized to evaluate flow at rest and with distal augmentation maneuvers in the common femoral, femoral and popliteal veins. COMPARISON:  None. FINDINGS: Contralateral Common Femoral Vein: Respiratory phasicity is normal and symmetric with the symptomatic side. No evidence of thrombus. Normal compressibility. Common Femoral Vein: No evidence of thrombus. Normal compressibility, respiratory phasicity and response to augmentation. Saphenofemoral Junction: No evidence of thrombus. Normal compressibility and flow **Note De-Identified [place] Obfuscation** on color Doppler imaging. Profunda Femoral Vein: No evidence of thrombus. Normal compressibility and flow on color Doppler imaging. Femoral Vein: No evidence of thrombus. Normal compressibility, respiratory phasicity and response to augmentation. Popliteal Vein: No evidence of thrombus. Normal compressibility, respiratory phasicity and response to augmentation. Calf Veins: No evidence of thrombus. Normal compressibility and flow on color Doppler imaging. Superficial Great Saphenous Vein: No evidence of thrombus. Normal compressibility. Venous Reflux:  None. Other Findings:  None. IMPRESSION: No evidence of deep venous thrombosis. Electronically Signed   By: Jacqulynn Cadet M.D.   On: 11/03/2017 12:47   Dg Chest Port 1 View  Result Date: 11/02/2017 CLINICAL DATA:  Metastatic hepatocellular carcinoma EXAM: PORTABLE CHEST 1 VIEW COMPARISON:  Chest CT 10/14/2017 FINDINGS: Innumerable nodules  throughout both lungs are again demonstrated, consistent with diffuse pulmonary metastatic disease. No superimposed consolidation. No pleural effusion or pneumothorax. There is shallow lung inflation. Cardiomediastinal contours are normal. IMPRESSION: Diffuse nodular pulmonary metastatic disease. Electronically Signed   By: Ulyses Jarred M.D.   On: 11/02/2017 16:52        Scheduled Meds: . enoxaparin (LOVENOX) injection  1.5 mg/kg Subcutaneous Q24H  . furosemide  40 mg Oral Daily  . potassium chloride SA  20 mEq Oral Daily  . spironolactone  25 mg Oral Daily  . umeclidinium-vilanterol  1 puff Inhalation Daily  . warfarin  5 mg Oral Once  . Warfarin - Pharmacist Dosing Inpatient   Does not apply q1800   Continuous Infusions: . sodium chloride 75 mL/hr at 11/03/17 1049  .  ceFAZolin (ANCEF) IV Stopped (11/03/17 1113)     LOS: 1 day    Time spent: 39mins    Kathie Dike, MD [facility] Pager [phone]  If 7PM-7AM, please contact night-coverage [email or web address] Password [email or web address] 11/03/2017, 3:05 PM

## 2017-11-03 NOTE — Progress Notes (Signed)
**Note De-Identified [place] Obfuscation** ANTICOAGULATION CONSULT NOTE - Initial Consult  Pharmacy Consult for warfarin Indication: VTE-portal vein thrombosis  No Known Allergies  Patient Measurements: Height: 5\' 7"  (170.2 cm) Weight: 161 lb (73 kg) IBW/kg (Calculated) : 66.1   Vital Signs: Temp: 97.7 F (36.5 C) (08/03 0612) Temp Source: Oral (08/03 0612) BP: 120/81 (08/03 0612) Pulse Rate: 94 (08/03 0612)  Labs: Recent Labs    11/02/17 1636 11/03/17 0547  HGB 12.7* 11.3*  HCT 38.6* 35.2*  PLT 204 204  LABPROT 14.5 15.8*  INR 1.14 1.27  CREATININE 1.20 1.16    Estimated Creatinine Clearance: 64.1 mL/min (by C-G formula based on SCr of 1.16 mg/dL).   Medical History: Past Medical History:  Diagnosis Date  . Acute renal failure ([place]) 02/07/2013  . Alcoholism ([place])   . Ascites   . Cirrhosis ([place])   . Hepatitis C 02/10/2013  . Liver cancer ([place])   . Lung cancer ([place])   . Lytic lesion of bone on x-ray 02/07/2013  . Pericardial effusion 02/07/2013  . UTI (urinary tract infection) 02/07/2013    Medications:  Medications Prior to Admission  Medication Sig Dispense Refill Last Dose  . furosemide (LASIX) 20 MG tablet Take 2 tablets (40 mg total) by mouth daily. 30 tablet 2 11/02/2017 at Unknown time  . hydrochlorothiazide (HYDRODIURIL) 12.5 MG tablet Take 1 tablet (12.5 mg total) by mouth daily. 30 tablet 1 11/02/2017 at Unknown time  . potassium chloride SA (K-DUR,KLOR-CON) 20 MEQ tablet Take 1 tablet (20 mEq total) by mouth daily. 30 tablet 2 11/02/2017 at Unknown time  . spironolactone (ALDACTONE) 25 MG tablet Take 1 tablet (25 mg total) by mouth daily. 30 tablet 2 11/02/2017 at Unknown time  . umeclidinium-vilanterol (ANORO ELLIPTA) 62.5-25 MCG/INH AEPB Inhale 1 puff into the lungs daily. **Every day inhaler** 1 each 12 11/02/2017 at Unknown time  . warfarin (COUMADIN) 5 MG tablet Take 1 tablet (5 mg total) by mouth daily. 30 tablet 0 11/02/2017 at 10AM  . amLODipine (NORVASC) 10 MG tablet Take 1 tablet (10 mg total)  by mouth daily. (Patient not taking: Reported on 10/17/2017) 90 tablet 3 Not Taking at Unknown time    Assessment: Pharmacy consulted to dose warfarin in patient with portal vein thrombosis. Patient will be bridged with lovenox since INR is subtherapeutic. Patient's home dose of warfarin is 5 mg daily per medication list.  Goal of Therapy:  INR 2-3 Monitor platelets by anticoagulation protocol: Yes   Plan:  Warfarin 5 mg x 1 dose Lovenox 1.5 mg/kg every 24 hours (110 mg every 24 hours) Monitor daily INR and s/s of bleeding  Ramond Craver 11/03/2017,8:46 AM

## 2017-11-04 LAB — CBC
HEMATOCRIT: 33.3 % — AB (ref 39.0–52.0)
HEMOGLOBIN: 11 g/dL — AB (ref 13.0–17.0)
MCH: 29.8 pg (ref 26.0–34.0)
MCHC: 33 g/dL (ref 30.0–36.0)
MCV: 90.2 fL (ref 78.0–100.0)
Platelets: 186 10*3/uL (ref 150–400)
RBC: 3.69 MIL/uL — ABNORMAL LOW (ref 4.22–5.81)
RDW: 19.6 % — ABNORMAL HIGH (ref 11.5–15.5)
WBC: 10 10*3/uL (ref 4.0–10.5)

## 2017-11-04 LAB — HIV ANTIBODY (ROUTINE TESTING W REFLEX): HIV Screen 4th Generation wRfx: NONREACTIVE

## 2017-11-04 LAB — PROTIME-INR
INR: 1.79
PROTHROMBIN TIME: 20.6 s — AB (ref 11.4–15.2)

## 2017-11-04 MED ORDER — WARFARIN SODIUM 5 MG PO TABS
5.0000 mg | ORAL_TABLET | Freq: Once | ORAL | Status: AC
  Administered 2017-11-04: 5 mg via ORAL
  Filled 2017-11-04: qty 1

## 2017-11-04 NOTE — Progress Notes (Addendum)
**Note De-Identified Marc Wilson Obfuscation** PROGRESS NOTE    Marc Wilson  WOE:[id number] DOB: 1955/05/15 DOA: 11/02/2017 PCP: Patient, No Pcp Per    Brief Narrative:  62 y/o male with history of cirrhosis, alcoholism, HepC, liver mass and portal vein thrombosis was admitted to the hospital with right lower leg cellulitis. He was admitted for IV antibiotics.  Assessment & Plan:   Principal Problem:   Cellulitis Active Problems:   Liver cirrhosis, alcoholic (HCC)   Ascites   Hepatitis C virus infection   Alcohol use disorder, moderate, dependence (HCC)   COPD (chronic obstructive pulmonary disease) with emphysema (HCC)   Essential hypertension   Liver mass   Portal vein thrombosis  1. Left lower leg cellulitis. Slowly improving on intravenous ancef.  Try to keep lower extremity elevated. Venous doppler negative for DVT.  2. Cirrhosis, alcoholic. Patient has stopped drinking alcohol.  This has been relatively stable.  3. Hepatitis C - Outpatient follow up with GI recommended.  4. HTN. Blood pressure stable. Chronically on amlodipine and HCTZ which were held on admission. Currently normotensive 5. Liver mass. MRI suggestive of hepatic carcinoma. Patient has scheduled oncology evaluation on 8/6 6. Portal vein thrombosis. Patient is chronically on coumadin, but this had been held for possible liver biopsy. Patient did not have biopsy done and coumadin was resumed, but INR still subtherapeutic. He is lovenox bridge. Family is familiar with giving lovenox injections at home if needed.    DVT prophylaxis: coumadin/lovenox Code Status: full code Family Communication: discussed with family members at the bedside Disposition Plan: discharge home once improved   Antimicrobials:   Ancef 8/2>   Subjective: Pt has pain and redness and erythema of right leg.   Objective: Vitals:   11/03/17 1248 11/03/17 2156 11/04/17 0557 11/04/17 0738  BP: 119/80 (!) 142/87 124/77   Pulse: 92 (!) 101 91   Resp: 17 18 17    Temp: 98.7 F  (37.1 C) 98.5 F (36.9 C) 98.2 F (36.8 C)   TempSrc: Oral Oral Oral   SpO2: 94% 95% 92% 91%  Weight:      Height:        Intake/Output Summary (Last 24 hours) at 11/04/2017 1249 Last data filed at 11/04/2017 0300 Gross per 24 hour  Intake 1020 ml  Output -  Net 1020 ml   Filed Weights   11/02/17 1356 11/02/17 1401  Weight: 73 kg (161 lb) 73 kg (161 lb)    Examination:  General exam: Appears calm and comfortable  Respiratory system: Clear to auscultation. Respiratory effort normal. Cardiovascular system: S1 & S2 heard, RRR. No JVD, murmurs, rubs, gallops or clicks. No pedal edema. Gastrointestinal system: Abdomen is nondistended, soft and nontender. No organomegaly or masses felt. Normal bowel sounds heard. Central nervous system: Alert and oriented. No focal neurological deficits. Extremities: Symmetric 5 x 5 power. Skin: erythema and swelling of left lower leg with some wrinkling of skin seen. No drainage seen.      Psychiatry: Judgement and insight appear normal. Mood & affect appropriate.   Data Reviewed: I have personally reviewed following labs and imaging studies  CBC: Recent Labs  Lab 10/30/17 1206 11/02/17 1636 11/03/17 0547 11/04/17 0726  WBC 11.6* 12.0* 10.1 10.0  NEUTROABS  --  9.5*  --   --   HGB 12.2* 12.7* 11.3* 11.0*  HCT 37.8* 38.6* 35.2* 33.3*  MCV 92.2 91.3 91.4 90.2  PLT 226 204 204 003   Basic Metabolic Panel: Recent Labs  Lab 11/02/17 1636 11/03/17 0547 **Note De-Identified Marc Wilson Obfuscation** NA 130* 129*  K 4.4 4.0  CL 93* 95*  CO2 26 25  GLUCOSE 108* 105*  BUN 24* 23*  CREATININE 1.20 1.16  CALCIUM 8.3* 7.8*   GFR: Estimated Creatinine Clearance: 64.1 mL/min (by C-G formula based on SCr of 1.16 mg/dL). Liver Function Tests: Recent Labs  Lab 11/02/17 1636  AST 273*  ALT 46*  ALKPHOS 167*  BILITOT 1.9*  PROT 7.2  ALBUMIN 2.7*   Recent Labs  Lab 11/02/17 1636  LIPASE 39   No results for input(s): AMMONIA in the last 168 hours. Coagulation  Profile: Recent Labs  Lab 10/30/17 1206 11/02/17 1636 11/03/17 0547 11/04/17 0726  INR 1.34 1.14 1.27 1.79   Cardiac Enzymes: No results for input(s): CKTOTAL, CKMB, CKMBINDEX, TROPONINI in the last 168 hours. BNP (last 3 results) No results for input(s): PROBNP in the last 8760 hours. HbA1C: No results for input(s): HGBA1C in the last 72 hours. CBG: No results for input(s): GLUCAP in the last 168 hours. Lipid Profile: No results for input(s): CHOL, HDL, LDLCALC, TRIG, CHOLHDL, LDLDIRECT in the last 72 hours. Thyroid Function Tests: No results for input(s): TSH, T4TOTAL, FREET4, T3FREE, THYROIDAB in the last 72 hours. Anemia Panel: No results for input(s): VITAMINB12, FOLATE, FERRITIN, TIBC, IRON, RETICCTPCT in the last 72 hours. Sepsis Labs: No results for input(s): PROCALCITON, LATICACIDVEN in the last 168 hours.  Recent Results (from the past 240 hour(s))  Culture, blood (routine x 2)     Status: None (Preliminary result)   Collection Time: 11/02/17  5:44 PM  Result Value Ref Range Status   Specimen Description BLOOD LEFT ARM  Final   Special Requests   Final    BOTTLES DRAWN AEROBIC AND ANAEROBIC Blood Culture adequate volume   Culture   Final    NO GROWTH 2 DAYS Performed at [facility], [address] [place], [place] [address]    Report Status PENDING  Incomplete  Culture, blood (routine x 2)     Status: None (Preliminary result)   Collection Time: 11/02/17  5:51 PM  Result Value Ref Range Status   Specimen Description BLOOD RIGHT HAND  Final   Special Requests   Final    BOTTLES DRAWN AEROBIC ONLY Blood Culture results may not be optimal due to an inadequate volume of blood received in culture bottles   Culture   Final    NO GROWTH 2 DAYS Performed at [facility] [address], [place], [place] [address]    Report Status PENDING  Incomplete    Radiology Studies: US Venous Img Lower Unilateral Left  Result Date: 11/03/2017 CLINICAL DATA:  62 year old male  with left lower extremity redness for the past 4 days EXAM: LEFT LOWER EXTREMITY VENOUS DOPPLER ULTRASOUND TECHNIQUE: Gray-scale sonography with graded compression, as well as color Doppler and duplex ultrasound were performed to evaluate the lower extremity deep venous systems from the level of the common femoral vein and including the common femoral, femoral, profunda femoral, popliteal and calf veins including the posterior tibial, peroneal and gastrocnemius veins when visible. The superficial great saphenous vein was also interrogated. Spectral Doppler was utilized to evaluate flow at rest and with distal augmentation maneuvers in the common femoral, femoral and popliteal veins. COMPARISON:  None. FINDINGS: Contralateral Common Femoral Vein: Respiratory phasicity is normal and symmetric with the symptomatic side. No evidence of thrombus. Normal compressibility. Common Femoral Vein: No evidence of thrombus. Normal compressibility, respiratory phasicity and response to augmentation. Saphenofemoral Junction: No evidence of thrombus. **Note De-Identified [place] Obfuscation** Normal compressibility and flow on color Doppler imaging. Profunda Femoral Vein: No evidence of thrombus. Normal compressibility and flow on color Doppler imaging. Femoral Vein: No evidence of thrombus. Normal compressibility, respiratory phasicity and response to augmentation. Popliteal Vein: No evidence of thrombus. Normal compressibility, respiratory phasicity and response to augmentation. Calf Veins: No evidence of thrombus. Normal compressibility and flow on color Doppler imaging. Superficial Great Saphenous Vein: No evidence of thrombus. Normal compressibility. Venous Reflux:  None. Other Findings:  None. IMPRESSION: No evidence of deep venous thrombosis. Electronically Signed   By: Jacqulynn Cadet M.D.   On: 11/03/2017 12:47   Dg Chest Port 1 View  Result Date: 11/02/2017 CLINICAL DATA:  Metastatic hepatocellular carcinoma EXAM: PORTABLE CHEST 1 VIEW COMPARISON:  Chest CT  10/14/2017 FINDINGS: Innumerable nodules throughout both lungs are again demonstrated, consistent with diffuse pulmonary metastatic disease. No superimposed consolidation. No pleural effusion or pneumothorax. There is shallow lung inflation. Cardiomediastinal contours are normal. IMPRESSION: Diffuse nodular pulmonary metastatic disease. Electronically Signed   By: Ulyses Jarred M.D.   On: 11/02/2017 16:52   Scheduled Meds: . enoxaparin (LOVENOX) injection  1.5 mg/kg Subcutaneous Q24H  . furosemide  40 mg Oral Daily  . potassium chloride SA  20 mEq Oral Daily  . spironolactone  25 mg Oral Daily  . umeclidinium-vilanterol  1 puff Inhalation Daily  . warfarin  5 mg Oral Once  . Warfarin - Pharmacist Dosing Inpatient   Does not apply q1800   Continuous Infusions: . sodium chloride 75 mL/hr at 11/04/17 0110  .  ceFAZolin (ANCEF) IV Stopped (11/04/17 1035)     LOS: 2 days    Time spent: 73mins  Irwin Brakeman, MD [facility] Pager [phone]  If 7PM-7AM, please contact night-coverage [email or web address] Password [email or web address] 11/04/2017, 12:49 PM

## 2017-11-04 NOTE — Progress Notes (Signed)
**Note De-Identified [place] Obfuscation** ANTICOAGULATION CONSULT NOTE - Initial Consult  Pharmacy Consult for warfarin Indication: VTE-portal vein thrombosis  No Known Allergies  Patient Measurements: Height: 5\' 7"  (170.2 cm) Weight: 161 lb (73 kg) IBW/kg (Calculated) : 66.1   Vital Signs: Temp: 98.2 F (36.8 C) (08/04 0557) Temp Source: Oral (08/04 0557) BP: 124/77 (08/04 0557) Pulse Rate: 91 (08/04 0557)  Labs: Recent Labs    11/02/17 1636 11/03/17 0547 11/04/17 0726  HGB 12.7* 11.3* 11.0*  HCT 38.6* 35.2* 33.3*  PLT 204 204 186  LABPROT 14.5 15.8* 20.6*  INR 1.14 1.27 1.79  CREATININE 1.20 1.16  --     Estimated Creatinine Clearance: 64.1 mL/min (by C-G formula based on SCr of 1.16 mg/dL).   Medical History: Past Medical History:  Diagnosis Date  . Acute renal failure ([place]) 02/07/2013  . Alcoholism ([place])   . Ascites   . Cirrhosis ([place])   . Hepatitis C 02/10/2013  . Liver cancer ([place])   . Lung cancer ([place])   . Lytic lesion of bone on x-ray 02/07/2013  . Pericardial effusion 02/07/2013  . UTI (urinary tract infection) 02/07/2013    Medications:  Medications Prior to Admission  Medication Sig Dispense Refill Last Dose  . furosemide (LASIX) 20 MG tablet Take 2 tablets (40 mg total) by mouth daily. 30 tablet 2 11/02/2017 at Unknown time  . hydrochlorothiazide (HYDRODIURIL) 12.5 MG tablet Take 1 tablet (12.5 mg total) by mouth daily. 30 tablet 1 11/02/2017 at Unknown time  . potassium chloride SA (K-DUR,KLOR-CON) 20 MEQ tablet Take 1 tablet (20 mEq total) by mouth daily. 30 tablet 2 11/02/2017 at Unknown time  . spironolactone (ALDACTONE) 25 MG tablet Take 1 tablet (25 mg total) by mouth daily. 30 tablet 2 11/02/2017 at Unknown time  . umeclidinium-vilanterol (ANORO ELLIPTA) 62.5-25 MCG/INH AEPB Inhale 1 puff into the lungs daily. **Every day inhaler** 1 each 12 11/02/2017 at Unknown time  . warfarin (COUMADIN) 5 MG tablet Take 1 tablet (5 mg total) by mouth daily. 30 tablet 0 11/02/2017 at 10AM  . amLODipine  (NORVASC) 10 MG tablet Take 1 tablet (10 mg total) by mouth daily. (Patient not taking: Reported on 10/17/2017) 90 tablet 3 Not Taking at Unknown time    Assessment: Pharmacy consulted to dose warfarin in patient with portal vein thrombosis. Patient will be bridged with lovenox since INR is subtherapeutic but trending up. Patient's home dose of warfarin is 5 mg daily per medication list.  Goal of Therapy:  INR 2-3 Monitor platelets by anticoagulation protocol: Yes   Plan:  Warfarin 5 mg x 1 dose Lovenox 1.5 mg/kg every 24 hours (110 mg every 24 hours) Monitor daily INR and s/s of bleeding  Remo Lipps C Shaia Porath 11/04/2017,9:58 AM

## 2017-11-05 ENCOUNTER — Inpatient Hospital Stay (HOSPITAL_COMMUNITY): Payer: Medicaid Other

## 2017-11-05 DIAGNOSIS — L03116 Cellulitis of left lower limb: Secondary | ICD-10-CM

## 2017-11-05 DIAGNOSIS — Z7951 Long term (current) use of inhaled steroids: Secondary | ICD-10-CM

## 2017-11-05 DIAGNOSIS — R160 Hepatomegaly, not elsewhere classified: Secondary | ICD-10-CM

## 2017-11-05 DIAGNOSIS — K7031 Alcoholic cirrhosis of liver with ascites: Secondary | ICD-10-CM

## 2017-11-05 DIAGNOSIS — I81 Portal vein thrombosis: Secondary | ICD-10-CM

## 2017-11-05 LAB — CBC
HCT: 33.8 % — ABNORMAL LOW (ref 39.0–52.0)
Hemoglobin: 11 g/dL — ABNORMAL LOW (ref 13.0–17.0)
MCH: 29.3 pg (ref 26.0–34.0)
MCHC: 32.5 g/dL (ref 30.0–36.0)
MCV: 90.1 fL (ref 78.0–100.0)
PLATELETS: 204 10*3/uL (ref 150–400)
RBC: 3.75 MIL/uL — ABNORMAL LOW (ref 4.22–5.81)
RDW: 19.5 % — ABNORMAL HIGH (ref 11.5–15.5)
WBC: 11.1 10*3/uL — ABNORMAL HIGH (ref 4.0–10.5)

## 2017-11-05 LAB — BASIC METABOLIC PANEL
Anion gap: 10 (ref 5–15)
BUN: 22 mg/dL — ABNORMAL HIGH (ref 6–20)
CALCIUM: 7.9 mg/dL — AB (ref 8.9–10.3)
CO2: 22 mmol/L (ref 22–32)
CREATININE: 1.04 mg/dL (ref 0.61–1.24)
Chloride: 99 mmol/L (ref 98–111)
GFR calc Af Amer: >60 mL/min (ref >60)
GLUCOSE: 93 mg/dL (ref 70–99)
Potassium: 3.9 mmol/L (ref 3.5–5.1)
Sodium: 131 mmol/L — ABNORMAL LOW (ref 135–145)

## 2017-11-05 LAB — PROTIME-INR
INR: 2.1
PROTHROMBIN TIME: 23.4 s — AB (ref 11.4–15.2)

## 2017-11-05 MED ORDER — WARFARIN SODIUM 5 MG PO TABS
5.0000 mg | ORAL_TABLET | Freq: Once | ORAL | Status: AC
  Administered 2017-11-05: 5 mg via ORAL
  Filled 2017-11-05: qty 1

## 2017-11-05 MED ORDER — VANCOMYCIN HCL IN DEXTROSE 750-5 MG/150ML-% IV SOLN
750.0000 mg | Freq: Two times a day (BID) | INTRAVENOUS | Status: DC
  Administered 2017-11-06 – 2017-11-07 (×3): 750 mg via INTRAVENOUS
  Filled 2017-11-05 (×5): qty 150

## 2017-11-05 MED ORDER — VANCOMYCIN HCL 10 G IV SOLR
1500.0000 mg | Freq: Once | INTRAVENOUS | Status: AC
  Administered 2017-11-05: 1500 mg via INTRAVENOUS
  Filled 2017-11-05: qty 1500

## 2017-11-05 NOTE — Consult Note (Signed)
**Note De-Identified [place] Obfuscation** [facility] Consultation Oncology  Name: Marc Wilson      MRN: [id number]    Location: H686/H683-72  Date: 11/05/2017 Time:12:37 PM   REFERRING PHYSICIAN: Dr. Wynetta Emery  REASON FOR CONSULT: Metastatic hepatocellular carcinoma   DIAGNOSIS: Left leg cellulitis  HISTORY OF PRESENT ILLNESS:    Marc Wilson 62 y.o. male  was seen in consultation in my office on 10/17/2017 for further work-up and management of metastatic hepatocellular carcinoma.  He has noticed rapid accumulation of fluid in his abdomen and lower extremities for past few weeks and presented to the ER on 10/14/2017.  A CT scan of the chest, abdomen and pelvis showed right lobe liver mass measuring 20 x 14 x 15 cm.  Liver was suggestive of cirrhosis.  Splenomegaly was also found.  Numerous bilateral lung nodules were found.  He underwent US guided paracentesis when 3.5 L was removed.  He was made an appointment to see [place] in the office.  On our way out to the office he stopped by at the ER as he noticed more reaccumulation of fluid.  Repeat paracentesis was done on 3.9 L of fluid was removed today.  He also noted some bleeding at the paracentesis site from Sunday.  He denies any bleeding.  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  He has a history of heavy alcohol abuse with one case of beer daily for 20 years followed by 12-15 beers daily in the last 20 years.  He also smoked 2 packs/day of cigarettes for the past 40 years.  He does report occasional vomiting.  he worked in [employer] for 17 years and recently lost his job.  He attributes that to loss of bowel control and multiple accidents at work.  He lost about 10 pounds in the last 3 to 6 months.  He has a remote history of IV drug abuse and hepatitis C.  He went to [facility] to the radiology on 10/30/2017 for possible biopsy.  The radiologist felt that it is unsafe to proceed with a liver biopsy  as he is high risk for bleeding.  A paracentesis was done at that time.  The same day he noticed erythema in his left leg.  He came to the ER on 11/02/2017 as the leg was hurting.  He was admitted to the hospital and is currently receiving Rocephin.  He also underwent paracentesis today and had 5 L taken out.  He is continuing Lasix and spironolactone prescribed by his.  Denies any fevers or chills in the last 1 week.  PAST MEDICAL HISTORY:   Past Medical History:  Diagnosis Date  . Acute renal failure ([place]) 02/07/2013  . Alcoholism ([place])   . Ascites   . Cirrhosis ([place])   . Hepatitis C 02/10/2013  . Liver cancer ([place])   . Lung cancer ([place])   . Lytic lesion of bone on x-ray 02/07/2013  . Pericardial effusion 02/07/2013  . UTI (urinary tract infection) 02/07/2013    ALLERGIES: No Known Allergies    MEDICATIONS: I have reviewed the patient's current medications.     PAST SURGICAL HISTORY Past Surgical History:  Procedure Laterality Date  . ESOPHAGOGASTRODUODENOSCOPY N/A 02/12/2013   Procedure: ESOPHAGOGASTRODUODENOSCOPY (EGD);  Surgeon: Gatha Mayer, MD;  Location: [facility] ENDOSCOPY;  Service: Endoscopy;  Laterality: N/A;  . IR PARACENTESIS  10/30/2017    FAMILY HISTORY: Family History  Problem Relation **Note De-Identified [place] Obfuscation** Age of Onset  . Diabetes type II Mother   . Cirrhosis Mother   . Diabetes type II Father   . Alcoholism Father   . Cirrhosis Father   . Cancer Sister        CLL  . Cancer Sister        gastric carcinoid  . Diabetes type II Brother   . Cancer Brother   . Pulmonary embolism Daughter   . Bipolar disorder Daughter   . Depression Daughter   . Colon cancer Neg Hx   . Throat cancer Neg Hx   . Heart disease Neg Hx   . Kidney disease Neg Hx     SOCIAL HISTORY:  reports that he has been smoking cigarettes.  He has a 10.00 pack-year smoking history. He has never used smokeless tobacco. He reports that he drank about 7.2 oz of alcohol per week. He reports that he does not use  drugs.  PERFORMANCE STATUS: The patient's performance status is 2 - Symptomatic, <50% confined to bed  PHYSICAL EXAM: Most Recent Vital Signs: Blood pressure 112/63, pulse 92, temperature 98 F (36.7 C), temperature source Oral, resp. rate 18, height _0  (1.702 m), weight 161 lb (73 kg), SpO2 92 %. BP 112/63 (BP Location: Left Arm)   Pulse 92   Temp 98 F (36.7 C) (Oral)   Resp 18   Ht _1  (1.702 m)   Wt 161 lb (73 kg)   SpO2 92%   BMI 25.22 kg/m  General appearance: alert and cooperative Lungs: clear to auscultation bilaterally Heart: regular rate and rhythm Abdomen: soft, non-tender; bowel sounds normal; no masses,  no organomegaly Extremities: edema Bilaterally, trace. Skin: Skin color, texture, turgor normal. No rashes or lesions or There is erythema in the left lower extremity involving the whole leg.  Tender to palpation.  LABORATORY DATA:  Results for orders placed or performed during the hospital encounter of 11/02/17 (from the past 48 hour(s))  CBC     Status: Abnormal   Collection Time: 11/04/17  7:26 AM  Result Value Ref Range   WBC 10.0 4.0 - 10.5 K/uL   RBC 3.69 (L) 4.22 - 5.81 MIL/uL   Hemoglobin 11.0 (L) 13.0 - 17.0 g/dL   HCT 33.3 (L) 39.0 - 52.0 %   MCV 90.2 78.0 - 100.0 fL   MCH 29.8 26.0 - 34.0 pg   MCHC 33.0 30.0 - 36.0 g/dL   RDW 19.6 (H) 11.5 - 15.5 %   Platelets 186 150 - 400 K/uL    Comment: Performed at [facility], [address] [place], [place] [address]  Protime-INR     Status: Abnormal   Collection Time: 11/04/17  7:26 AM  Result Value Ref Range   Prothrombin Time 20.6 (H) 11.4 - 15.2 seconds   INR 1.79     Comment: Performed at [facility], [address] [place],  [address]  CBC     Status: Abnormal   Collection Time: 11/05/17  5:56 AM  Result Value Ref Range   WBC 11.1 (H) 4.0 - 10.5 K/uL   RBC 3.75 (L) 4.22 - 5.81 MIL/uL   Hemoglobin 11.0 (L) 13.0 - 17.0 g/dL   HCT 33.8 (L) 39.0 - 52.0 %   MCV 90.1 78.0 - 100.0 fL    MCH 29.3 26.0 - 34.0 pg   MCHC 32.5 30.0 - 36.0 g/dL   RDW 19.5 (H) 11.5 - 15.5 %   Platelets 204 150 - 400 K/uL **Note De-Identified [place] Obfuscation** Comment: Performed at [facility], [address] [place], [place] [address]  Protime-INR     Status: Abnormal   Collection Time: 11/05/17  5:56 AM  Result Value Ref Range   Prothrombin Time 23.4 (H) 11.4 - 15.2 seconds   INR 2.10     Comment: Performed at [facility], [address], [place], [place] [address]  Basic metabolic panel     Status: Abnormal   Collection Time: 11/05/17  5:56 AM  Result Value Ref Range   Sodium 131 (L) 135 - 145 mmol/L   Potassium 3.9 3.5 - 5.1 mmol/L   Chloride 99 98 - 111 mmol/L   CO2 22 22 - 32 mmol/L   Glucose, Bld 93 70 - 99 mg/dL   BUN 22 (H) 6 - 20 mg/dL   Creatinine, Ser 1.04 0.61 - 1.24 mg/dL   Calcium 7.9 (L) 8.9 - 10.3 mg/dL   GFR calc non Af Amer >60 >60 mL/min   GFR calc Af Amer >60 >60 mL/min    Comment: (NOTE) The eGFR has been calculated using the CKD EPI equation. This calculation has not been validated in all clinical situations. eGFR's persistently <60 mL/min signify possible Chronic Kidney Disease.    Anion gap 10 5 - 15    Comment: Performed at [facility], [address] [place], [place] [address]      RADIOGRAPHY: US Venous Img Lower Unilateral Left  Result Date: 11/03/2017 CLINICAL DATA:  62 year old male with left lower extremity redness for the past 4 days EXAM: LEFT LOWER EXTREMITY VENOUS DOPPLER ULTRASOUND TECHNIQUE: Gray-scale sonography with graded compression, as well as color Doppler and duplex ultrasound were performed to evaluate the lower extremity deep venous systems from the level of the common femoral vein and including the common femoral, femoral, profunda femoral, popliteal and calf veins including the posterior tibial, peroneal and gastrocnemius veins when visible. The superficial great saphenous vein was also interrogated. Spectral Doppler was utilized to evaluate flow at rest and with distal  augmentation maneuvers in the common femoral, femoral and popliteal veins. COMPARISON:  None. FINDINGS: Contralateral Common Femoral Vein: Respiratory phasicity is normal and symmetric with the symptomatic side. No evidence of thrombus. Normal compressibility. Common Femoral Vein: No evidence of thrombus. Normal compressibility, respiratory phasicity and response to augmentation. Saphenofemoral Junction: No evidence of thrombus. Normal compressibility and flow on color Doppler imaging. Profunda Femoral Vein: No evidence of thrombus. Normal compressibility and flow on color Doppler imaging. Femoral Vein: No evidence of thrombus. Normal compressibility, respiratory phasicity and response to augmentation. Popliteal Vein: No evidence of thrombus. Normal compressibility, respiratory phasicity and response to augmentation. Calf Veins: No evidence of thrombus. Normal compressibility and flow on color Doppler imaging. Superficial Great Saphenous Vein: No evidence of thrombus. Normal compressibility. Venous Reflux:  None. Other Findings:  None. IMPRESSION: No evidence of deep venous thrombosis. Electronically Signed   By: Jacqulynn Cadet M.D.   On: 11/03/2017 12:47   US Paracentesis  Result Date: 11/05/2017 INDICATION: Ascites.  Cirrhosis. EXAM: ULTRASOUND GUIDED THERAPEUTIC PARACENTESIS MEDICATIONS: None. COMPLICATIONS: None immediate. PROCEDURE: Informed written consent was obtained from the patient after a discussion of the risks, benefits and alternatives to treatment. A timeout was performed prior to the initiation of the procedure. Initial ultrasound scanning demonstrates a large amount of ascites within the right lower abdominal quadrant. The right lower abdomen was prepped and draped in the usual sterile fashion. 1% lidocaine with epinephrine was used for local anesthesia. Following this, a 5 [place] catheter **Note De-Identified [place] Obfuscation** was introduced. An ultrasound image was saved for documentation purposes. The paracentesis was performed.  The catheter was removed and a dressing was applied. The patient tolerated the procedure well without immediate post procedural complication. FINDINGS: A total of approximately 5.4 L of yellowish fluid was removed. IMPRESSION: Successful ultrasound-guided paracentesis yielding 5.4 liters of peritoneal fluid. Electronically Signed   By: Franki Cabot M.D.   On: 11/05/2017 11:45        ASSESSMENT: 1. Presumed metastatic hepatocellular carcinoma to the liver 2.  Cellulitis of the left lower extremity  3. Recurrent ascites involving multiple paracentesis 4.  Portal vein thrombosis, requiring anti-Coblation  PLAN:  1. Presumed metastatic hepatocellular carcinoma to the liver: - We have scheduled him for biopsy of the liver lesion last Tuesday.  However the radiologist felt that doing a biopsy is more detrimental because of bleeding risk due to ascites.  As his AFP was 93,000, we can safely assume that this is coming from hepatocellular carcinoma. - He is currently admitted to the hospital for left leg cellulitis.  He is receiving Rocephin.  If the cellulitis does not improve consider covering MRSA. -Daughter is at the bedside.  I have discussed the terminal nature of his cancer.  Any intended treatment will be palliative.  I would like to see him back in 1 week in my office.  If his physical condition improves, I will consider systemic therapy in the form of lenvatinib.  If his physical condition deteriorates, we will consider palliative care in the form of hospice.  Questions were encouraged and answered to satisfaction.  All questions were answered. The patient knows to call the clinic with any problems, questions or concerns. We can certainly see the patient much sooner if necessary.   Derek Jack

## 2017-11-05 NOTE — Clinical Social Work Note (Signed)
**Note De-Identified [place] Obfuscation** Patient consulted for inability to pay for medications. This is a case management. Case management is aware . LCSW signing off.     Affie Gasner, Clydene Pugh, LCSW

## 2017-11-05 NOTE — Progress Notes (Signed)
**Note De-Identified [place] Obfuscation** Pharmacy Antibiotic Note  Marc Wilson is a 62 y.o. male admitted on 11/02/2017 with cellulitis.  Pharmacy has been consulted for Vancomycin dosing.  Plan: Vancomycin 1500mg  loading dose, then 750mg  IV every 12 hours.  Goal trough 10-15 mcg/mL.  F/U cxs and clinical progress Monitor V/S, labs, and levels as indicated  Height: 5\' 7"  (170.2 cm) Weight: 161 lb (73 kg) IBW/kg (Calculated) : 66.1  Temp (24hrs), Avg:98.1 F (36.7 C), Min:98 F (36.7 C), Max:98.2 F (36.8 C)  Recent Labs  Lab 10/30/17 1206 11/02/17 1636 11/03/17 0547 11/04/17 0726 11/05/17 0556  WBC 11.6* 12.0* 10.1 10.0 11.1*  CREATININE  --  1.20 1.16  --  1.04    Estimated Creatinine Clearance: 71.5 mL/min (by C-G formula based on SCr of 1.04 mg/dL).    No Known Allergies  Antimicrobials this admission: Vancomycin 8/5>> Cefazolin 8/2 >> 8/5   Dose adjustments this admission: n/a  Microbiology results: 8/2 BCx: ngtd  Thank you for allowing pharmacy to be a part of this patient's care.  Isac Sarna, BS [place], [place] Clinical Pharmacist Pager [phone] 11/05/2017 3:32 PM

## 2017-11-05 NOTE — Progress Notes (Addendum)
**Note De-Identified [name]** PROGRESS NOTE    Marc Wilson  FAO:[id number] DOB: 01-08-1956 DOA: 11/02/2017 PCP: Patient, No Pcp Per    Brief Narrative:  62 y/o male with history of cirrhosis, alcoholism, HepC, liver mass and portal vein thrombosis was admitted to the hospital with right lower leg cellulitis. He was admitted for IV antibiotics.  Assessment & Plan:   Principal Problem:   Cellulitis Active Problems:   Liver cirrhosis, alcoholic (HCC)   Ascites   Hepatitis C virus infection   Alcohol use disorder, moderate, dependence (HCC)   COPD (chronic obstructive pulmonary disease) with emphysema (HCC)   Essential hypertension   Liver mass   Portal vein thrombosis  1. Left lower leg cellulitis.  Not improving on intravenous ancef and bump in WBC, concerned about MRSA, will start vancomycin.  Try to keep lower extremity elevated. Venous doppler negative for DVT.  2. Cirrhosis, alcoholic. Patient has stopped drinking alcohol.  This has been relatively stable.  3. Hepatitis C - Outpatient follow up with GI recommended.  4. HTN. Blood pressure stable. Chronically on amlodipine and HCTZ which were held on admission. Currently normotensive 5. Liver mass. MRI suggestive of hepatic carcinoma. Patient has scheduled oncology evaluation on 8/6.  Please see consult notes.   6. Portal vein thrombosis. Patient is chronically on coumadin, but this had been held for possible liver biopsy. Patient did not have biopsy done and coumadin was resumed, but INR still subtherapeutic. He is lovenox bridge. Family is familiar with giving lovenox injections at home if needed.    DVT prophylaxis: coumadin/lovenox Code Status: full code Family Communication: discussed with family members at the bedside Disposition Plan: discharge home once improved  Antimicrobials:   Ancef 8/2>8/5  Vancomycin 8/5   Subjective: Pt says that only having minimal improvement, still very painful.    Objective: Vitals:   11/05/17 0955 11/05/17  1046 11/05/17 1126 11/05/17 1411  BP: 126/86 119/74 112/63 130/80  Pulse: 95 90 92 89  Resp: 18 18 18 18   Temp: 98 F (36.7 C)   98 F (36.7 C)  TempSrc: Oral   Oral  SpO2: 94% 92% 92% 95%  Weight:      Height:        Intake/Output Summary (Last 24 hours) at 11/05/2017 1528 Last data filed at 11/05/2017 0900 Gross per 24 hour  Intake 240 ml  Output 250 ml  Net -10 ml   Filed Weights   11/02/17 1356 11/02/17 1401  Weight: 73 kg (161 lb) 73 kg (161 lb)   Examination:  General exam: Appears calm and comfortable.   Respiratory system: Clear to auscultation. Respiratory effort normal. Cardiovascular system: S1 & S2 heard, RRR. No JVD, murmurs, rubs, gallops or clicks. No pedal edema. Gastrointestinal system: Abdomen is nondistended, soft and nontender. No organomegaly or masses felt. Normal bowel sounds heard. Central nervous system: Alert and oriented. No focal neurological deficits. Extremities: Symmetric 5 x 5 power. Skin: erythema and swelling of left lower leg with some wrinkling of skin seen. No drainage seen.  Psychiatry: Judgement and insight appear normal. Mood & affect appropriate.   Data Reviewed: I have personally reviewed following labs and imaging studies  CBC: Recent Labs  Lab 10/30/17 1206 11/02/17 1636 11/03/17 0547 11/04/17 0726 11/05/17 0556  WBC 11.6* 12.0* 10.1 10.0 11.1*  NEUTROABS  --  9.5*  --   --   --   HGB 12.2* 12.7* 11.3* 11.0* 11.0*  HCT 37.8* 38.6* 35.2* 33.3* 33.8*  MCV 92.2 91.3 91.4 **Note De-Identified [place] Obfuscation** 90.2 90.1  PLT 226 204 204 186 161   Basic Metabolic Panel: Recent Labs  Lab 11/02/17 1636 11/03/17 0547 11/05/17 0556  NA 130* 129* 131*  K 4.4 4.0 3.9  CL 93* 95* 99  CO2 26 25 22   GLUCOSE 108* 105* 93  BUN 24* 23* 22*  CREATININE 1.20 1.16 1.04  CALCIUM 8.3* 7.8* 7.9*   GFR: Estimated Creatinine Clearance: 71.5 mL/min (by C-G formula based on SCr of 1.04 mg/dL). Liver Function Tests: Recent Labs  Lab 11/02/17 1636  AST 273*  ALT 46*    ALKPHOS 167*  BILITOT 1.9*  PROT 7.2  ALBUMIN 2.7*   Recent Labs  Lab 11/02/17 1636  LIPASE 39   No results for input(s): AMMONIA in the last 168 hours. Coagulation Profile: Recent Labs  Lab 10/30/17 1206 11/02/17 1636 11/03/17 0547 11/04/17 0726 11/05/17 0556  INR 1.34 1.14 1.27 1.79 2.10   Cardiac Enzymes: No results for input(s): CKTOTAL, CKMB, CKMBINDEX, TROPONINI in the last 168 hours. BNP (last 3 results) No results for input(s): PROBNP in the last 8760 hours. HbA1C: No results for input(s): HGBA1C in the last 72 hours. CBG: No results for input(s): GLUCAP in the last 168 hours. Lipid Profile: No results for input(s): CHOL, HDL, LDLCALC, TRIG, CHOLHDL, LDLDIRECT in the last 72 hours. Thyroid Function Tests: No results for input(s): TSH, T4TOTAL, FREET4, T3FREE, THYROIDAB in the last 72 hours. Anemia Panel: No results for input(s): VITAMINB12, FOLATE, FERRITIN, TIBC, IRON, RETICCTPCT in the last 72 hours. Sepsis Labs: No results for input(s): PROCALCITON, LATICACIDVEN in the last 168 hours.  Recent Results (from the past 240 hour(s))  Culture, blood (routine x 2)     Status: None (Preliminary result)   Collection Time: 11/02/17  5:44 PM  Result Value Ref Range Status   Specimen Description BLOOD LEFT ARM  Final   Special Requests   Final    BOTTLES DRAWN AEROBIC AND ANAEROBIC Blood Culture adequate volume   Culture   Final    NO GROWTH 3 DAYS Performed at [facility], [address] [place], [place] [address]    Report Status PENDING  Incomplete  Culture, blood (routine x 2)     Status: None (Preliminary result)   Collection Time: 11/02/17  5:51 PM  Result Value Ref Range Status   Specimen Description BLOOD RIGHT HAND  Final   Special Requests   Final    BOTTLES DRAWN AEROBIC ONLY Blood Culture results may not be optimal due to an inadequate volume of blood received in culture bottles   Culture   Final    NO GROWTH 3 DAYS Performed at [facility], [address] [place], [place] [address]    Report Status PENDING  Incomplete    Radiology Studies: US Paracentesis  Result Date: 11/05/2017 INDICATION: Ascites.  Cirrhosis. EXAM: ULTRASOUND GUIDED THERAPEUTIC PARACENTESIS MEDICATIONS: None. COMPLICATIONS: None immediate. PROCEDURE: Informed written consent was obtained from the patient after a discussion of the risks, benefits and alternatives to treatment. A timeout was performed prior to the initiation of the procedure. Initial ultrasound scanning demonstrates a large amount of ascites within the right lower abdominal quadrant. The right lower abdomen was prepped and draped in the usual sterile fashion. 1% lidocaine with epinephrine was used for local anesthesia. Following this, a 5 [place] catheter was introduced. An ultrasound image was saved for documentation purposes. The paracentesis was performed. The catheter was removed and a dressing was applied. The patient tolerated the procedure well without **Note De-Identified [place] Obfuscation** immediate post procedural complication. FINDINGS: A total of approximately 5.4 L of yellowish fluid was removed. IMPRESSION: Successful ultrasound-guided paracentesis yielding 5.4 liters of peritoneal fluid. Electronically Signed   By: Franki Cabot M.D.   On: 11/05/2017 11:45   Scheduled Meds: . enoxaparin (LOVENOX) injection  1.5 mg/kg Subcutaneous Q24H  . furosemide  40 mg Oral Daily  . potassium chloride SA  20 mEq Oral Daily  . spironolactone  25 mg Oral Daily  . umeclidinium-vilanterol  1 puff Inhalation Daily  . warfarin  5 mg Oral Once  . Warfarin - Pharmacist Dosing Inpatient   Does not apply q1800   Continuous Infusions: . sodium chloride 75 mL/hr at 11/05/17 0304     LOS: 3 days   Time spent: 22 mins  Irwin Brakeman, MD [facility] Pager [phone]  If 7PM-7AM, please contact night-coverage [email or web address] Password [email or web address] 11/05/2017, 3:28 PM

## 2017-11-05 NOTE — Progress Notes (Addendum)
**Note De-Identified [place] Obfuscation** Pt complaining of difficulty breathing he states his stomach "is so tight and I can't breathe" Pt states that "they need to do another procedure to take fluid out" He also states that he had "one last week"  Pt shows no acute distress and vital signs are within defined limits and are as follows    11/05/17 0955  Vitals  Temp 98 F (36.7 C)  Temp Source Oral  BP 126/86  BP Location Left Arm  BP Method Automatic  Patient Position (if appropriate) Lying  Pulse Rate 95  Pulse Rate Source Dinamap  Resp 18  Oxygen Therapy  SpO2 94 %  O2 Device Room Air   MD has been E-Paged. Will continue to monitor patient.  US paracentesis ordered.

## 2017-11-05 NOTE — Progress Notes (Signed)
**Note De-Identified Thede Obfuscation** ANTICOAGULATION CONSULT NOTE - Initial Consult  Pharmacy Consult for warfarin Indication: VTE-portal vein thrombosis  No Known Allergies  Patient Measurements: Height: 5\' 7"  (170.2 cm) Weight: 161 lb (73 kg) IBW/kg (Calculated) : 66.1   Vital Signs: Temp: 98 F (36.7 C) (08/05 0955) Temp Source: Oral (08/05 0955) BP: 119/74 (08/05 1046) Pulse Rate: 90 (08/05 1046)  Labs: Recent Labs    11/02/17 1636 11/03/17 0547 11/04/17 0726 11/05/17 0556  HGB 12.7* 11.3* 11.0* 11.0*  HCT 38.6* 35.2* 33.3* 33.8*  PLT 204 204 186 204  LABPROT 14.5 15.8* 20.6* 23.4*  INR 1.14 1.27 1.79 2.10  CREATININE 1.20 1.16  --  1.04    Estimated Creatinine Clearance: 71.5 mL/min (by C-G formula based on SCr of 1.04 mg/dL).   Medical History: Past Medical History:  Diagnosis Date  . Acute renal failure ([place]) 02/07/2013  . Alcoholism ([place])   . Ascites   . Cirrhosis ([place])   . Hepatitis C 02/10/2013  . Liver cancer ([place])   . Lung cancer ([place])   . Lytic lesion of bone on x-ray 02/07/2013  . Pericardial effusion 02/07/2013  . UTI (urinary tract infection) 02/07/2013    Medications:  Medications Prior to Admission  Medication Sig Dispense Refill Last Dose  . furosemide (LASIX) 20 MG tablet Take 2 tablets (40 mg total) by mouth daily. 30 tablet 2 11/02/2017 at Unknown time  . hydrochlorothiazide (HYDRODIURIL) 12.5 MG tablet Take 1 tablet (12.5 mg total) by mouth daily. 30 tablet 1 11/02/2017 at Unknown time  . potassium chloride SA (K-DUR,KLOR-CON) 20 MEQ tablet Take 1 tablet (20 mEq total) by mouth daily. 30 tablet 2 11/02/2017 at Unknown time  . spironolactone (ALDACTONE) 25 MG tablet Take 1 tablet (25 mg total) by mouth daily. 30 tablet 2 11/02/2017 at Unknown time  . umeclidinium-vilanterol (ANORO ELLIPTA) 62.5-25 MCG/INH AEPB Inhale 1 puff into the lungs daily. **Every day inhaler** 1 each 12 11/02/2017 at Unknown time  . warfarin (COUMADIN) 5 MG tablet Take 1 tablet (5 mg total) by mouth daily. 30  tablet 0 11/02/2017 at 10AM  . amLODipine (NORVASC) 10 MG tablet Take 1 tablet (10 mg total) by mouth daily. (Patient not taking: Reported on 10/17/2017) 90 tablet 3 Not Taking at Unknown time    Assessment: Pharmacy consulted to dose warfarin in patient with portal vein thrombosis. Patient's INR is therapeutic today 8/5 and needs 24 hours of bridging with lovenox. Patient's home dose of warfarin is 5 mg daily per medication list.  Goal of Therapy:  INR 2-3 Monitor platelets by anticoagulation protocol: Yes   Plan:  Warfarin 5 mg x 1 dose Lovenox 1.5 mg/kg every 24 hours (110 mg every 24 hours) Monitor daily INR and s/s of bleeding  Remo Lipps C Jaymes Hang 11/05/2017,10:49 AM

## 2017-11-05 NOTE — Progress Notes (Signed)
**Note De-identified De[place]orff Obfuscation** Paracentesis complete no signs of distress.  

## 2017-11-06 ENCOUNTER — Inpatient Hospital Stay (HOSPITAL_COMMUNITY): Payer: Medicaid Other

## 2017-11-06 ENCOUNTER — Inpatient Hospital Stay (HOSPITAL_COMMUNITY): Payer: Medicaid Other | Admitting: Hematology

## 2017-11-06 DIAGNOSIS — K7460 Unspecified cirrhosis of liver: Secondary | ICD-10-CM

## 2017-11-06 DIAGNOSIS — C220 Liver cell carcinoma: Secondary | ICD-10-CM

## 2017-11-06 DIAGNOSIS — C7800 Secondary malignant neoplasm of unspecified lung: Secondary | ICD-10-CM

## 2017-11-06 LAB — PROTIME-INR
INR: 2.5
PROTHROMBIN TIME: 26.8 s — AB (ref 11.4–15.2)

## 2017-11-06 LAB — CBC
HCT: 34.5 % — ABNORMAL LOW (ref 39.0–52.0)
HEMOGLOBIN: 11.2 g/dL — AB (ref 13.0–17.0)
MCH: 29.2 pg (ref 26.0–34.0)
MCHC: 32.5 g/dL (ref 30.0–36.0)
MCV: 90.1 fL (ref 78.0–100.0)
Platelets: 166 10*3/uL (ref 150–400)
RBC: 3.83 MIL/uL — AB (ref 4.22–5.81)
RDW: 19.8 % — ABNORMAL HIGH (ref 11.5–15.5)
WBC: 12.7 10*3/uL — ABNORMAL HIGH (ref 4.0–10.5)

## 2017-11-06 MED ORDER — WARFARIN SODIUM 2 MG PO TABS
3.0000 mg | ORAL_TABLET | Freq: Once | ORAL | Status: AC
  Administered 2017-11-06: 3 mg via ORAL
  Filled 2017-11-06: qty 1

## 2017-11-06 NOTE — Progress Notes (Addendum)
**Note De-Identified Marc Wilson** PROGRESS NOTE    Marc Wilson  SEG:[id number] DOB: 02-24-1956 DOA: 11/02/2017 PCP: Marc Wilson, No Pcp Per    Brief Narrative:  62 y/o male with history of cirrhosis, alcoholism, HepC, liver mass and portal vein thrombosis was admitted to the hospital with right lower leg cellulitis. He was admitted for IV antibiotics.  Assessment & Plan:   Principal Problem:   Cellulitis Active Problems:   Liver cirrhosis, alcoholic (HCC)   Ascites   Hepatitis C virus infection   Alcohol use disorder, moderate, dependence (HCC)   COPD (chronic obstructive pulmonary disease) with emphysema (HCC)   Essential hypertension   Liver mass   Portal vein thrombosis  1. Left lower leg cellulitis-  Seems to be improving on IV vancomycin.  Try to keep lower extremity elevated. Venous doppler negative for DVT.  2. Leukocytosis - cellulitis seems to be improving, check CXR.  Recheck in AM. Also could be reactive from metastatic liver disease.  3. Cirrhosis, alcoholic. Marc Wilson has stopped drinking alcohol.  Pt had paracentesis 8/5 with 5L removed.  4. Hepatitis C - Outpatient follow up with GI recommended.  5. HTN. Blood pressure stable. Chronically on amlodipine and HCTZ which were held on admission. Currently normotensive 6. Liver masses. MRI suggestive of hepatic carcinoma. Marc Wilson has scheduled oncology evaluation on 8/6.  Please see consult notes.  He should follow up with Dr. Raliegh Ip (heme/onc) in 1 week.   7. Portal vein thrombosis. Marc Wilson is chronically on coumadin, but this had been held for possible liver biopsy. Marc Wilson did not have biopsy done and coumadin was resumed, but INR still subtherapeutic. He is lovenox bridge. Family is familiar with giving lovenox injections at home if needed.    DVT prophylaxis: coumadin Code Status: full code Family Communication: discussed with family members at the bedside Disposition Plan: discharge home once improved  Antimicrobials:   Ancef 8/2>8/5  Vancomycin 8/5    Subjective: Pt says he feels better after paracentesis yesterday.  Leg feeling better less painful.    Objective: Vitals:   11/05/17 1126 11/05/17 1411 11/05/17 2245 11/06/17 0553  BP: 112/63 130/80 131/78 124/80  Pulse: 92 89 94 90  Resp: 18 18 17 16   Temp:  98 F (36.7 C) 98.2 F (36.8 C) 98.4 F (36.9 C)  TempSrc:  Oral Oral Oral  SpO2: 92% 95% 93% 95%  Weight:      Height:        Intake/Output Summary (Last 24 hours) at 11/06/2017 1358 Last data filed at 11/06/2017 0313 Gross per 24 hour  Intake 4158.92 ml  Output -  Net 4158.92 ml   Filed Weights   11/02/17 1356 11/02/17 1401  Weight: 73 kg (161 lb) 73 kg (161 lb)   Examination:  General exam: Appears calm and comfortable.   Respiratory system: Clear to auscultation. Respiratory effort normal. Cardiovascular system: S1 & S2 heard, RRR. No JVD, murmurs, rubs, gallops or clicks. No pedal edema. Gastrointestinal system: Abdomen is mildly distended, soft and nontender. No organomegaly or masses felt. Normal bowel sounds heard. Central nervous system: Alert and oriented. No focal neurological deficits. Extremities: Symmetric 5 x 5 power. Skin: erythema and swelling of left lower leg with some wrinkling of skin seen appears to be improving. No drainage seen.  Psychiatry: Judgement and insight appear normal. Mood & affect appropriate.   Data Reviewed: I have personally reviewed following labs and imaging studies  CBC: Recent Labs  Lab 11/02/17 1636 11/03/17 0547 11/04/17 7371 11/05/17 0556 11/06/17 0626 **Note De-Identified Sliva Wilson** WBC 12.0* 10.1 10.0 11.1* 12.7*  NEUTROABS 9.5*  --   --   --   --   HGB 12.7* 11.3* 11.0* 11.0* 11.2*  HCT 38.6* 35.2* 33.3* 33.8* 34.5*  MCV 91.3 91.4 90.2 90.1 90.1  PLT 204 204 186 204 774   Basic Metabolic Panel: Recent Labs  Lab 11/02/17 1636 11/03/17 0547 11/05/17 0556  NA 130* 129* 131*  K 4.4 4.0 3.9  CL 93* 95* 99  CO2 26 25 22   GLUCOSE 108* 105* 93  BUN 24* 23* 22*  CREATININE 1.20 1.16  1.04  CALCIUM 8.3* 7.8* 7.9*   GFR: Estimated Creatinine Clearance: 71.5 mL/min (by C-G formula based on SCr of 1.04 mg/dL). Liver Function Tests: Recent Labs  Lab 11/02/17 1636  AST 273*  ALT 46*  ALKPHOS 167*  BILITOT 1.9*  PROT 7.2  ALBUMIN 2.7*   Recent Labs  Lab 11/02/17 1636  LIPASE 39   No results for input(s): AMMONIA in the last 168 hours. Coagulation Profile: Recent Labs  Lab 11/02/17 1636 11/03/17 0547 11/04/17 0726 11/05/17 0556 11/06/17 0555  INR 1.14 1.27 1.79 2.10 2.50   Cardiac Enzymes: No results for input(s): CKTOTAL, CKMB, CKMBINDEX, TROPONINI in the last 168 hours. BNP (last 3 results) No results for input(s): PROBNP in the last 8760 hours. HbA1C: No results for input(s): HGBA1C in the last 72 hours. CBG: No results for input(s): GLUCAP in the last 168 hours. Lipid Profile: No results for input(s): CHOL, HDL, LDLCALC, TRIG, CHOLHDL, LDLDIRECT in the last 72 hours. Thyroid Function Tests: No results for input(s): TSH, T4TOTAL, FREET4, T3FREE, THYROIDAB in the last 72 hours. Anemia Panel: No results for input(s): VITAMINB12, FOLATE, FERRITIN, TIBC, IRON, RETICCTPCT in the last 72 hours. Sepsis Labs: No results for input(s): PROCALCITON, LATICACIDVEN in the last 168 hours.  Recent Results (from the past 240 hour(s))  Culture, blood (routine x 2)     Status: None (Preliminary result)   Collection Time: 11/02/17  5:44 PM  Result Value Ref Range Status   Specimen Description BLOOD LEFT ARM  Final   Special Requests   Final    BOTTLES DRAWN AEROBIC AND ANAEROBIC Blood Culture adequate volume   Culture   Final    NO GROWTH 4 DAYS Performed at [facility], [address], [place], [place] [address]    Report Status PENDING  Incomplete  Culture, blood (routine x 2)     Status: None (Preliminary result)   Collection Time: 11/02/17  5:51 PM  Result Value Ref Range Status   Specimen Description BLOOD RIGHT HAND  Final   Special Requests   Final     BOTTLES DRAWN AEROBIC ONLY Blood Culture results may not be optimal due to an inadequate volume of blood received in culture bottles   Culture   Final    NO GROWTH 4 DAYS Performed at [facility], [address] [place], [place] [address]    Report Status PENDING  Incomplete    Radiology Studies: US Paracentesis  Result Date: 11/05/2017 INDICATION: Ascites.  Cirrhosis. EXAM: ULTRASOUND GUIDED THERAPEUTIC PARACENTESIS MEDICATIONS: None. COMPLICATIONS: None immediate. PROCEDURE: Informed written consent was obtained from the Marc Wilson after a discussion of the risks, benefits and alternatives to treatment. A timeout was performed prior to the initiation of the procedure. Initial ultrasound scanning demonstrates a large amount of ascites within the right lower abdominal quadrant. The right lower abdomen was prepped and draped in the usual sterile fashion. 1% lidocaine with epinephrine was used **Note De-Identified Knaak Wilson** for local anesthesia. Following this, a 5 French catheter was introduced. An ultrasound image was saved for documentation purposes. The paracentesis was performed. The catheter was removed and a dressing was applied. The Marc Wilson tolerated the procedure well without immediate post procedural complication. FINDINGS: A total of approximately 5.4 L of yellowish fluid was removed. IMPRESSION: Successful ultrasound-guided paracentesis yielding 5.4 liters of peritoneal fluid. Electronically Signed   By: Franki Cabot M.D.   On: 11/05/2017 11:45   Scheduled Meds: . furosemide  40 mg Oral Daily  . potassium chloride SA  20 mEq Oral Daily  . spironolactone  25 mg Oral Daily  . umeclidinium-vilanterol  1 puff Inhalation Daily  . warfarin  3 mg Oral Once  . Warfarin - Pharmacist Dosing Inpatient   Does not apply q1800   Continuous Infusions: . sodium chloride 10 mL/hr at 11/05/17 1557  . vancomycin Stopped (11/06/17 0413)     LOS: 4 days   Time spent: 21 mins  Irwin Brakeman, MD [facility] Pager  [phone]  If 7PM-7AM, please contact night-coverage [email or web address] Password [email or web address] 11/06/2017, 1:58 PM

## 2017-11-06 NOTE — Plan of Care (Signed)
**Note De-identified Kudo Obfuscation**  **Note De-Identified Korell Obfuscation** Problem: Acute Rehab PT Goals(only PT should resolve) Goal: Pt Will Transfer Bed To Chair/Chair To Bed Outcome: Progressing Flowsheets (Taken 11/06/2017 1228) Pt will Transfer Bed to Chair/Chair to Bed: Independently Goal: Pt Will Ambulate Outcome: Progressing Flowsheets (Taken 11/06/2017 1228) Pt will Ambulate: > 125 feet;with modified independence;with rolling walker   12:28 PM, 11/06/17 Lonell Grandchild, MPT Physical Therapist with [facility] 336 [phone] office [phone] mobile phone

## 2017-11-06 NOTE — Evaluation (Signed)
**Note De-Identified [place]ough Obfuscation** Physical Therapy Evaluation Patient Details Name: Marc Wilson MRN: [id number] DOB: [birth year] Today's Date: 11/06/2017   History of Present Illness  Marc Wilson is a 62 y.o. male with a history of cirrhosis, hepatitis C, history of alcoholism in remission, hypertension.  Patient has a recent diagnosis of liver mass with suspicion of hepatic carcinoma with metastatic disease and portal vein thrombosis.  Patient has been on Coumadin, which was recently stopped as the patient was supposed to have a liver biopsy on 7/30.  An MRI was done in lieu of the liver biopsy, which was suggestive of hepatic carcinoma.  Patient was supposed to restart his Coumadin.  Patient is here for left leg pain which started on Tuesday.  This has increased over the past few days.  Yesterday the patient began to have erythema that is spread up his foot onto his anterior leg.  There is also some swelling.  The leg is very tender to touch.  Pain is nonradiating, but it is worsening.  No palliating or provoking factors.    Clinical Impression  Patient functioning near baseline for functional mobility and gait, demonstrates slightly labored cadence during gait training with occasional leaning over RW once fatigued.  Patient will benefit from continued physical therapy in hospital and recommended venue below to increase strength, balance, endurance for safe ADLs and gait.     Follow Up Recommendations No PT follow up    Equipment Recommendations  None recommended by PT    Recommendations for Other Services       Precautions / Restrictions Precautions Precautions: Fall Restrictions Weight Bearing Restrictions: No      Mobility  Bed Mobility Overal bed mobility: Independent                Transfers Overall transfer level: Modified independent Equipment used: Rolling walker (2 wheeled)                Ambulation/Gait Ambulation/Gait assistance: Supervision Gait Distance (Feet): 100 Feet Assistive  device: Rolling walker (2 wheeled) Gait Pattern/deviations: Decreased step length - left;Decreased stance time - left;Decreased stride length Gait velocity: decreased   General Gait Details: slow slightly labored cadence without loss of balance, limited secondary to c/o fatigue  Stairs            Wheelchair Mobility    Modified Rankin (Stroke Patients Only)       Balance Overall balance assessment: Needs assistance Sitting-balance support: Feet supported;No upper extremity supported Sitting balance-Leahy Scale: Good     Standing balance support: During functional activity;No upper extremity supported Standing balance-Leahy Scale: Fair Standing balance comment: fair/good using RW                             Pertinent Vitals/Pain Pain Assessment: 0-10 Pain Score: 7  Pain Location: LLE Pain Descriptors / Indicators: Sore;Discomfort Pain Intervention(s): Limited activity within patient's tolerance;Monitored during session    [place] expects to be discharged to:: Private residence Living Arrangements: Alone Available Help at Discharge: Family;Friend(s) Type of Home: Mobile home Home Access: Stairs to enter Entrance Stairs-Rails: Right;Left;Can reach both Entrance Stairs-Number of Steps: 3 Home Layout: One level Home Equipment: Cane - single point;Walker - 2 wheels;Bedside commode;Hospital bed      Prior Function Level of Independence: Independent         Comments: community ambulator, drives     Hand Dominance        Extremity/Trunk Assessment **Note De-Identified Larner Obfuscation** Upper Extremity Assessment Upper Extremity Assessment: Overall WFL for tasks assessed    Lower Extremity Assessment Lower Extremity Assessment: Overall WFL for tasks assessed;LLE deficits/detail LLE Deficits / Details: grossly -4/5    Cervical / Trunk Assessment Cervical / Trunk Assessment: Normal  Communication      Cognition Arousal/Alertness: Awake/alert Behavior  During Therapy: WFL for tasks assessed/performed Overall Cognitive Status: Within Functional Limits for tasks assessed                                        General Comments      Exercises     Assessment/Plan    PT Assessment Patient needs continued PT services  PT Problem List Decreased strength;Decreased activity tolerance;Decreased balance;Decreased mobility       PT Treatment Interventions Gait training;Stair training;Functional mobility training;Therapeutic activities;Therapeutic exercise;Patient/family education    PT Goals (Current goals can be found in the Care Plan section)  Acute Rehab PT Goals Patient Stated Goal: return home PT Goal Formulation: With patient Time For Goal Achievement: 11/08/17 Potential to Achieve Goals: Good    Frequency Min 2X/week   Barriers to discharge        Co-evaluation               AM-PAC PT "6 Clicks" Daily Activity  Outcome Measure Difficulty turning over in bed (including adjusting bedclothes, sheets and blankets)?: None Difficulty moving from lying on back to sitting on the side of the bed? : None Difficulty sitting down on and standing up from a chair with arms (e.g., wheelchair, bedside commode, etc,.)?: None Help needed moving to and from a bed to chair (including a wheelchair)?: None Help needed walking in hospital room?: A Little Help needed climbing 3-5 steps with a railing? : A Little 6 Click Score: 22    End of Session   Activity Tolerance: Patient tolerated treatment well;Patient limited by fatigue Patient left: in bed;with call bell/phone within reach Nurse Communication: Mobility status PT Visit Diagnosis: Unsteadiness on feet (R26.81);Other abnormalities of gait and mobility (R26.89);Muscle weakness (generalized) (M62.81)    Time: 9735-3299 PT Time Calculation (min) (ACUTE ONLY): 26 min   Charges:   PT Evaluation $PT Eval Moderate Complexity: 1 Mod PT Treatments $Therapeutic  Activity: 23-37 mins        12:27 PM, 11/06/17 Lonell Grandchild, MPT Physical Therapist with [facility] 336 [phone] office [phone] mobile phone

## 2017-11-06 NOTE — Progress Notes (Signed)
**Note De-Identified K[place] Obfuscation** ANTICOAGULATION CONSULT NOTE - Initial Consult  Pharmacy Consult for warfarin Indication: VTE-portal vein thrombosis  No Known Allergies  Patient Measurements: Height: 5\' 7"  (170.2 cm) Weight: 161 lb (73 kg) IBW/kg (Calculated) : 66.1   Vital Signs: Temp: 98.4 F (36.9 C) (08/06 0553) Temp Source: Oral (08/06 0553) BP: 124/80 (08/06 0553) Pulse Rate: 90 (08/06 0553)  Labs: Recent Labs    11/04/17 0726 11/05/17 0556 11/06/17 0555  HGB 11.0* 11.0* 11.2*  HCT 33.3* 33.8* 34.5*  PLT 186 204 166  LABPROT 20.6* 23.4* 26.8*  INR 1.79 2.10 2.50  CREATININE  --  1.04  --     Estimated Creatinine Clearance: 71.5 mL/min (by C-G formula based on SCr of 1.04 mg/dL).   Medical History: Past Medical History:  Diagnosis Date  . Acute renal failure ([place]) 02/07/2013  . Alcoholism ([place])   . Ascites   . Cirrhosis ([place])   . Hepatitis C 02/10/2013  . Liver cancer ([place])   . Lung cancer ([place])   . Lytic lesion of bone on x-ray 02/07/2013  . Pericardial effusion 02/07/2013  . UTI (urinary tract infection) 02/07/2013    Medications:  Medications Prior to Admission  Medication Sig Dispense Refill Last Dose  . furosemide (LASIX) 20 MG tablet Take 2 tablets (40 mg total) by mouth daily. 30 tablet 2 11/02/2017 at Unknown time  . hydrochlorothiazide (HYDRODIURIL) 12.5 MG tablet Take 1 tablet (12.5 mg total) by mouth daily. 30 tablet 1 11/02/2017 at Unknown time  . potassium chloride SA (K-DUR,KLOR-CON) 20 MEQ tablet Take 1 tablet (20 mEq total) by mouth daily. 30 tablet 2 11/02/2017 at Unknown time  . spironolactone (ALDACTONE) 25 MG tablet Take 1 tablet (25 mg total) by mouth daily. 30 tablet 2 11/02/2017 at Unknown time  . umeclidinium-vilanterol (ANORO ELLIPTA) 62.5-25 MCG/INH AEPB Inhale 1 puff into the lungs daily. **Every day inhaler** 1 each 12 11/02/2017 at Unknown time  . warfarin (COUMADIN) 5 MG tablet Take 1 tablet (5 mg total) by mouth daily. 30 tablet 0 11/02/2017 at 10AM  . amLODipine  (NORVASC) 10 MG tablet Take 1 tablet (10 mg total) by mouth daily. (Patient not taking: Reported on 10/17/2017) 90 tablet 3 Not Taking at Unknown time    Assessment: Pharmacy consulted to dose warfarin in patient with portal vein thrombosis. Patient's INR is therapeutic for >24 hours and can discontinue lovenox today. Patient's home dose of warfarin is 5 mg daily per medication list.  Goal of Therapy:  INR 2-3 Monitor platelets by anticoagulation protocol: Yes   Plan:  Warfarin 3 mg x 1 dose Monitor daily INR and s/s of bleeding  Ramond Craver 11/06/2017,1:38 PM

## 2017-11-07 DIAGNOSIS — C220 Liver cell carcinoma: Secondary | ICD-10-CM

## 2017-11-07 DIAGNOSIS — C7800 Secondary malignant neoplasm of unspecified lung: Secondary | ICD-10-CM

## 2017-11-07 DIAGNOSIS — F1020 Alcohol dependence, uncomplicated: Secondary | ICD-10-CM

## 2017-11-07 LAB — CULTURE, BLOOD (ROUTINE X 2)
CULTURE: NO GROWTH
CULTURE: NO GROWTH
Special Requests: ADEQUATE

## 2017-11-07 LAB — PROTIME-INR
INR: 2.64
Prothrombin Time: 28 seconds — ABNORMAL HIGH (ref 11.4–15.2)

## 2017-11-07 LAB — CBC
HEMATOCRIT: 34.5 % — AB (ref 39.0–52.0)
Hemoglobin: 11.2 g/dL — ABNORMAL LOW (ref 13.0–17.0)
MCH: 29.2 pg (ref 26.0–34.0)
MCHC: 32.5 g/dL (ref 30.0–36.0)
MCV: 90.1 fL (ref 78.0–100.0)
Platelets: 164 10*3/uL (ref 150–400)
RBC: 3.83 MIL/uL — ABNORMAL LOW (ref 4.22–5.81)
RDW: 19.4 % — AB (ref 11.5–15.5)
WBC: 13.7 10*3/uL — ABNORMAL HIGH (ref 4.0–10.5)

## 2017-11-07 MED ORDER — DOXYCYCLINE HYCLATE 100 MG PO CAPS: 100.0000 mg | ORAL_CAPSULE | Freq: Two times a day (BID) | ORAL | 0 refills | Status: DC

## 2017-11-07 NOTE — Progress Notes (Signed)
**Note De-Identified [place] Obfuscation** IV discontinued,catheter intact. Discharge instructions given on medications and follow up visits. Patient verbalized understanding. Accompanied by staff to an awaiting vehicle.

## 2017-11-07 NOTE — Care Management (Signed)
**Note De-Identified [place] Obfuscation** Pt from home, ind with ADL;s. Has no insurance, unemployed, no PCP. Per daughter at bedside pt has appointment with [facility] tomorrow to establish care. Pt given resource packet for [employer], Good Rx card and [facility] voucher. Pt has been seen and screened by financial counselor.

## 2017-11-07 NOTE — Discharge Summary (Signed)
**Note De-Identified [place] Obfuscation** Physician Discharge Summary  Marc Wilson Wilson DOB: December 31, 1955 DOA: 11/02/2017  PCP: [place] date: 11/02/2017 Discharge date: 11/07/2017  Admitted From: Home  Disposition: Home  Recommendations for Outpatient Follow-up:  1. Follow up with PCP in 3-5 days 2. Check PT/INR in 3 days 3. Follow up with Oncologist in 1 week 4. Please obtain BMP/CBC in one week 5. Please follow up on the following pending results: Final culture data  Discharge Condition: STABLE   CODE STATUS: FULL    Brief Hospitalization Summary: Please see all hospital notes, images, labs for full details of the hospitalization.  HPI: Marc Wilson is a 62 y.o. male with a history of cirrhosis, hepatitis C, history of alcoholism in remission, hypertension.  Patient has a recent diagnosis of liver mass with suspicion of hepatic carcinoma with metastatic disease and portal vein thrombosis.  Patient has been on Coumadin, which was recently stopped as the patient was supposed to have a liver biopsy on 7/30.  An MRI was done in lieu of the liver biopsy, which was suggestive of hepatic carcinoma.  Patient was supposed to restart his Coumadin.  Patient is here for left leg pain which started on Tuesday.  This has increased over the past few days.  Yesterday the patient began to have erythema that is spread up his foot onto his anterior leg.  There is also some swelling.  The leg is very tender to touch.  Pain is nonradiating, but it is worsening.  No palliating or provoking factors.  Emergency Department Course: Patient has a white count of 12.  INR is 1.14.  Patient received vancomycin in the emergency department  Brief Narrative:  62 y/o male with history of cirrhosis, alcoholism, HepC, liver mass and portal vein thrombosis was admitted to the hospital with right lower leg cellulitis. He was admitted for IV antibiotics.  Assessment & Plan:   Principal Problem:   Cellulitis Active  Problems:   Liver cirrhosis, alcoholic (HCC)   Ascites   Hepatitis C virus infection   Alcohol use disorder, moderate, dependence (HCC)   COPD (chronic obstructive pulmonary disease) with emphysema (HCC)   Essential hypertension   Liver mass   Portal vein thrombosis  1. Left lower leg cellulitis-  Seems to be improving on IV vancomycin.  Pt is ambulating without pain. He wants to go home today.  Will discharge on 7 more days of doxycycline to cover MRSA.   Venous doppler negative for DVT.  2. Leukocytosis - cellulitis seems to be improving, CXR no active disease.  suspect that this is reactive from metastatic liver disease.  He was told that he needs to follow up with his oncologist Dr. Worthy Keeler in 1 week. He says he will do so.   3. Cirrhosis, alcoholic. Patient has stopped drinking alcohol.  Pt had paracentesis 8/5 with 5L removed.  4. Hepatitis C - Outpatient follow up with GI recommended.  5. HTN. He has been normotensive. 6. Liver masses. MRI suggestive of hepatic carcinoma. Patient has scheduled oncology evaluation on 8/6.  Please see consult notes.  He should follow up with Dr. Raliegh Ip (heme/onc) in 1 week for consideration of palliative chemotherapy and other treatment measures.   7. Portal vein thrombosis. Patient is chronically on coumadin, but this had been held for possible liver biopsy. Patient did not have biopsy done and coumadin was resumed and INR is therapeutic.     DVT prophylaxis: coumadin Code Status: full code Family Communication: discussed **Note De-Identified [place] Obfuscation** with family members at the bedside Disposition Plan: discharge home Antimicrobials:   Ancef 8/2>8/5  Vancomycin 8/5 -8/7  Discharge Diagnoses:  Principal Problem:   Cellulitis Active Problems:   Liver cirrhosis, alcoholic (HCC)   Ascites   Hepatitis C virus infection   Alcohol use disorder, moderate, dependence (HCC)   COPD (chronic obstructive pulmonary disease) with emphysema (HCC)   Essential hypertension   Liver  mass   Portal vein thrombosis   Cirrhosis ([place])   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[facility])  Discharge Instructions: Discharge Instructions    Call MD for:  difficulty breathing, headache or visual disturbances   Complete by:  As directed    Call MD for:  extreme fatigue   Complete by:  As directed    Call MD for:  persistant dizziness or light-headedness   Complete by:  As directed    Call MD for:  severe uncontrolled pain   Complete by:  As directed    Call MD for:  temperature >100.4   Complete by:  As directed    Diet - low sodium heart healthy   Complete by:  As directed    Increase activity slowly   Complete by:  As directed      Allergies as of 11/07/2017   No Known Allergies     Medication List    STOP taking these medications   amLODipine 10 MG tablet Commonly known as:  NORVASC   hydrochlorothiazide 12.5 MG tablet Commonly known as:  HYDRODIURIL     TAKE these medications   doxycycline 100 MG capsule Commonly known as:  VIBRAMYCIN Take 1 capsule (100 mg total) by mouth 2 (two) times daily for 7 days.   furosemide 20 MG tablet Commonly known as:  LASIX Take 2 tablets (40 mg total) by mouth daily.   potassium chloride SA 20 MEQ tablet Commonly known as:  K-DUR,KLOR-CON Take 1 tablet (20 mEq total) by mouth daily.   spironolactone 25 MG tablet Commonly known as:  ALDACTONE Take 1 tablet (25 mg total) by mouth daily.   umeclidinium-vilanterol 62.5-25 MCG/INH Aepb Commonly known as:  ANORO ELLIPTA Inhale 1 puff into the lungs daily. **Every day inhaler**   warfarin 5 MG tablet Commonly known as:  COUMADIN Take 1 tablet (5 mg total) by mouth daily.      Follow-up Information    Derek Jack, MD. Schedule an appointment as soon as possible for a visit in 1 week(s).   Specialty:  Hematology Why:  Hospital Follow Up  Contact information: [place] [address] [phone]        Janora Norlander, DO. Schedule an  appointment as soon as possible for a visit in 5 day(s).   Specialty:  Family Medicine Why:  Hospital Follow Up  Contact information: [place] [address] [phone]          No Known Allergies Allergies as of 11/07/2017   No Known Allergies     Medication List    STOP taking these medications   amLODipine 10 MG tablet Commonly known as:  NORVASC   hydrochlorothiazide 12.5 MG tablet Commonly known as:  HYDRODIURIL     TAKE these medications   doxycycline 100 MG capsule Commonly known as:  VIBRAMYCIN Take 1 capsule (100 mg total) by mouth 2 (two) times daily for 7 days.   furosemide 20 MG tablet Commonly known as:  LASIX Take 2 tablets (40 mg total) by mouth daily. **Note De-Identified [place] Obfuscation** potassium chloride SA 20 MEQ tablet Commonly known as:  K-DUR,KLOR-CON Take 1 tablet (20 mEq total) by mouth daily.   spironolactone 25 MG tablet Commonly known as:  ALDACTONE Take 1 tablet (25 mg total) by mouth daily.   umeclidinium-vilanterol 62.5-25 MCG/INH Aepb Commonly known as:  ANORO ELLIPTA Inhale 1 puff into the lungs daily. **Every day inhaler**   warfarin 5 MG tablet Commonly known as:  COUMADIN Take 1 tablet (5 mg total) by mouth daily.       Procedures/Studies: Dg Chest 2 View  Result Date: 10/14/2017 CLINICAL DATA:  Shortness of breath and cough. EXAM: CHEST - 2 VIEW COMPARISON:  11/24/2016 FINDINGS: Heart size appears normal. No pleural effusion identified. There are innumerable pulmonary nodules identified throughout both lungs. These are suspicious for metastatic disease. A larger nodular opacity is identified in the left base measuring 2.4 cm. IMPRESSION: 1. Diffuse pulmonary nodularity worrisome for metastatic disease. Suggest further evaluation with contrast enhanced CT of the chest. Electronically Signed   By: Kerby Moors M.D.   On: 10/14/2017 14:40   Ct Chest W Contrast  Result Date: 10/14/2017 CLINICAL DATA:  Abdominal and leg swelling. Short of breath  since last night. Also with diarrhea. EXAM: CT CHEST, ABDOMEN, AND PELVIS WITH CONTRAST TECHNIQUE: Multidetector CT imaging of the chest, abdomen and pelvis was performed following the standard protocol during bolus administration of intravenous contrast. CONTRAST:  49mL OMNIPAQUE IOHEXOL 300 MG/ML  SOLN COMPARISON:  Current chest radiograph. Abdomen pelvis CT, 02/07/2013. FINDINGS: CT CHEST FINDINGS Cardiovascular: Heart is normal in size and configuration. Mild left coronary artery calcifications. Great vessels normal in caliber. No aortic dissection. No significant atherosclerosis. Aortic arch branch vessels are widely patent. Mediastinum/Nodes: No enlarged mediastinal, hilar, or axillary lymph nodes. Thyroid gland, trachea, and esophagus demonstrate no significant findings. Lungs/Pleura: There are numerous bilateral pulmonary nodules consistent with widespread metastatic disease. More confluent opacities noted in both lower lobes, consistent with a combination of metastatic disease and atelectasis or, less likely, infection. There are underlying changes of mild centrilobular emphysema. No evidence of pulmonary edema. No pleural effusion or pneumothorax. CT ABDOMEN PELVIS FINDINGS Hepatobiliary: Abnormal appearance of the liver. There is heterogeneous poorly defined mass that involves most of the right lobe, measuring approximately 20 x 14 x 15 cm. This mass shows areas of heterogeneous decreased attenuation with adjacent areas of intermediate and higher attenuation. The liver also demonstrates a nodular contour and relative enlargement of the lateral segment left lobe, findings consistent with cirrhosis. Overall, the appearance is consistent with a large area of a cellular carcinoma in the setting of cirrhosis. Hypoattenuation is seen at the origin of the right portal vein consistent with tumor thrombosis. This appears to extend just into the main portal vein. There is decreased opacification of the superior  mesenteric vein which may reflect additional thrombosis or be due to slow flow. The left portal vein is patent. There is mild thickening and increased enhancement of the wall of the gallbladder most evident along its lower aspect. No convincing gallstone. No bile duct dilation. Pancreas: Unremarkable. No pancreatic ductal dilatation or surrounding inflammatory changes. Spleen: Spleen is enlarged measuring 20 x 6.5 x 13.5 cm. No splenic mass or focal lesion. Adrenals/Urinary Tract: Adrenal glands are unremarkable. Kidneys are normal, without renal calculi, focal lesion, or hydronephrosis. Bladder is unremarkable. Stomach/Bowel: Stomach is unremarkable. No bowel dilation, wall thickening or convincing inflammation. Normal appendix. Vascular/Lymphatic: There are perisplenic, coronary and para soft angio venous collaterals. Aorta demonstrates atherosclerotic disease **Note De-Identified [place] Obfuscation** which extends into the iliac vessels. No aneurysm. 13 mm shotty aortocaval node the level of the left renal vein no other discrete enlarged lymph nodes. Reproductive: Unremarkable. Other: Small to moderate amount of ascites. MUSCULOSKELETAL FINDINGS No acute fracture. Old fracture of L1. No osteoblastic or osteolytic lesions. IMPRESSION: 1. Findings are consistent with a large or multifocal hepatocellular carcinoma involving most of the right liver lobe, with extensive pulmonary metastatic disease. There is a single shotty mildly enlarged aortocaval node which may reflect metastatic adenopathy. Tumor invades and expands the right portal vein with extension into the adjacent main portal vein. The left portal vein remains patent. Questionable thrombus in the superior mesenteric vein. Small to moderate amount of ascites. This may be due to cirrhosis and portal venous hypertension, suspected. Peritoneal metastatic disease is not excluded although not discretely visualized. 2. No other evidence of metastatic disease. 3. 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 4. More confluent type opacity is noted at both lung bases which is most likely combination of metastatic disease and atelectasis. Consider pneumonia if there are consistent clinical findings. Electronically Signed   By: Lajean Manes M.D.   On: 10/14/2017 16:42   Ct Abdomen Pelvis W Contrast  Result Date: 10/14/2017 CLINICAL DATA:  Abdominal and leg swelling. Short of breath since last night. Also with diarrhea. EXAM: CT CHEST, ABDOMEN, AND PELVIS WITH CONTRAST TECHNIQUE: Multidetector CT imaging of the chest, abdomen and pelvis was performed following the standard protocol during bolus administration of intravenous contrast. CONTRAST:  83mL OMNIPAQUE IOHEXOL 300 MG/ML  SOLN COMPARISON:  Current chest radiograph. Abdomen pelvis CT, 02/07/2013. FINDINGS: CT CHEST FINDINGS Cardiovascular: Heart is normal in size and configuration. Mild left coronary artery calcifications. Great vessels normal in caliber. No aortic dissection. No significant atherosclerosis. Aortic arch branch vessels are widely patent. Mediastinum/Nodes: No enlarged mediastinal, hilar, or axillary lymph nodes. Thyroid gland, trachea, and esophagus demonstrate no significant findings. Lungs/Pleura: There are numerous bilateral pulmonary nodules consistent with widespread metastatic disease. More confluent opacities noted in both lower lobes, consistent with a combination of metastatic disease and atelectasis or, less likely, infection. There are underlying changes of mild centrilobular emphysema. No evidence of pulmonary edema. No pleural effusion or pneumothorax. CT ABDOMEN PELVIS FINDINGS Hepatobiliary: Abnormal appearance of the liver. There is heterogeneous poorly defined mass that involves most of the right lobe, measuring approximately 20 x 14 x 15 cm. This mass shows areas of heterogeneous decreased attenuation with adjacent areas of intermediate and higher attenuation. The liver also  demonstrates a nodular contour and relative enlargement of the lateral segment left lobe, findings consistent with cirrhosis. Overall, the appearance is consistent with a large area of a cellular carcinoma in the setting of cirrhosis. Hypoattenuation is seen at the origin of the right portal vein consistent with tumor thrombosis. This appears to extend just into the main portal vein. There is decreased opacification of the superior mesenteric vein which may reflect additional thrombosis or be due to slow flow. The left portal vein is patent. There is mild thickening and increased enhancement of the wall of the gallbladder most evident along its lower aspect. No convincing gallstone. No bile duct dilation. Pancreas: Unremarkable. No pancreatic ductal dilatation or surrounding inflammatory changes. Spleen: Spleen is enlarged measuring 20 x 6.5 x 13.5 cm. No splenic mass or focal lesion. Adrenals/Urinary Tract: Adrenal glands are unremarkable. Kidneys are normal, without renal calculi, focal lesion, or hydronephrosis. Bladder is unremarkable. Stomach/Bowel: Stomach is unremarkable. No bowel dilation, **Note De-Identified [place] Obfuscation** wall thickening or convincing inflammation. Normal appendix. Vascular/Lymphatic: There are perisplenic, coronary and para soft angio venous collaterals. Aorta demonstrates atherosclerotic disease which extends into the iliac vessels. No aneurysm. 13 mm shotty aortocaval node the level of the left renal vein no other discrete enlarged lymph nodes. Reproductive: Unremarkable. Other: Small to moderate amount of ascites. MUSCULOSKELETAL FINDINGS No acute fracture. Old fracture of L1. No osteoblastic or osteolytic lesions. IMPRESSION: 1. Findings are consistent with a large or multifocal hepatocellular carcinoma involving most of the right liver lobe, with extensive pulmonary metastatic disease. There is a single shotty mildly enlarged aortocaval node which may reflect metastatic adenopathy. Tumor invades and expands the right  portal vein with extension into the adjacent main portal vein. The left portal vein remains patent. Questionable thrombus in the superior mesenteric vein. Small to moderate amount of ascites. This may be due to cirrhosis and portal venous hypertension, suspected. Peritoneal metastatic disease is not excluded although not discretely visualized. 2. No other evidence of metastatic disease. 3. 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 4. More confluent type opacity is noted at both lung bases which is most likely combination of metastatic disease and atelectasis. Consider pneumonia if there are consistent clinical findings. Electronically Signed   By: Lajean Manes M.D.   On: 10/14/2017 16:42   Mr Liver W Wo Contrast  Result Date: 10/30/2017 CLINICAL DATA:  Liver mass EXAM: MRI ABDOMEN WITHOUT AND WITH CONTRAST TECHNIQUE: Multiplanar multisequence MR imaging of the abdomen was performed both before and after the administration of intravenous contrast. CONTRAST:  92mL MULTIHANCE GADOBENATE DIMEGLUMINE 529 MG/ML IV SOLN COMPARISON:  CT chest abdomen pelvis dated 10/14/2017 FINDINGS: Motion degraded images. Lower chest: Numerous pulmonary nodules/metastases, better evaluated on CT. Hepatobiliary: Cirrhosis. 18.2 x 14.9 x 17.7 cm heterogeneously enhancing mass replacing the entire right hepatic lobe, compatible with hepatocellular carcinoma. Gallbladder is unremarkable. No intrahepatic or extrahepatic ductal dilatation. Pancreas:  Within normal limits. Spleen: Enlarged, measuring 20.0 cm in maximal craniocaudal dimension. Adrenals/Urinary Tract:  Adrenal glands are within normal limits. Kidneys are within normal limits.  No hydronephrosis. Stomach/Bowel: Stomach is within normal limits. Bowel is grossly unremarkable. Vascular/Lymphatic:  No evidence of abdominal aortic aneurysm. Thrombus in the right portal vein (series [id number] [id number]), suspicious for tumor thrombus in this  patient. Gastroesophageal varices.  Recanalized periumbilical vein. Other:  Upper abdominal ascites. Musculoskeletal: No focal osseous lesions. IMPRESSION: Motion degraded images. 18.2 cm mass replacing the entire right hepatic lobe, compatible with hepatocellular carcinoma. Suspected tumor thrombus in the right portal vein. Numerous pulmonary nodules/metastases. Additional ancillary findings as above. Electronically Signed   By: Julian Hy M.D.   On: 10/30/2017 19:19   US Venous Img Lower Unilateral Left  Result Date: 11/03/2017 CLINICAL DATA:  62 year old male with left lower extremity redness for the past 4 days EXAM: LEFT LOWER EXTREMITY VENOUS DOPPLER ULTRASOUND TECHNIQUE: Gray-scale sonography with graded compression, as well as color Doppler and duplex ultrasound were performed to evaluate the lower extremity deep venous systems from the level of the common femoral vein and including the common femoral, femoral, profunda femoral, popliteal and calf veins including the posterior tibial, peroneal and gastrocnemius veins when visible. The superficial great saphenous vein was also interrogated. Spectral Doppler was utilized to evaluate flow at rest and with distal augmentation maneuvers in the common femoral, femoral and popliteal veins. COMPARISON:  None. FINDINGS: Contralateral Common Femoral Vein: Respiratory phasicity is normal and symmetric with the symptomatic side. No evidence of thrombus. Normal **Note De-Identified [place] Obfuscation** compressibility. Common Femoral Vein: No evidence of thrombus. Normal compressibility, respiratory phasicity and response to augmentation. Saphenofemoral Junction: No evidence of thrombus. Normal compressibility and flow on color Doppler imaging. Profunda Femoral Vein: No evidence of thrombus. Normal compressibility and flow on color Doppler imaging. Femoral Vein: No evidence of thrombus. Normal compressibility, respiratory phasicity and response to augmentation. Popliteal Vein: No evidence of thrombus.  Normal compressibility, respiratory phasicity and response to augmentation. Calf Veins: No evidence of thrombus. Normal compressibility and flow on color Doppler imaging. Superficial Great Saphenous Vein: No evidence of thrombus. Normal compressibility. Venous Reflux:  None. Other Findings:  None. IMPRESSION: No evidence of deep venous thrombosis. Electronically Signed   By: Jacqulynn Cadet M.D.   On: 11/03/2017 12:47   US Paracentesis  Result Date: 11/05/2017 INDICATION: Ascites.  Cirrhosis. EXAM: ULTRASOUND GUIDED THERAPEUTIC PARACENTESIS MEDICATIONS: None. COMPLICATIONS: None immediate. PROCEDURE: Informed written consent was obtained from the patient after a discussion of the risks, benefits and alternatives to treatment. A timeout was performed prior to the initiation of the procedure. Initial ultrasound scanning demonstrates a large amount of ascites within the right lower abdominal quadrant. The right lower abdomen was prepped and draped in the usual sterile fashion. 1% lidocaine with epinephrine was used for local anesthesia. Following this, a 5 French catheter was introduced. An ultrasound image was saved for documentation purposes. The paracentesis was performed. The catheter was removed and a dressing was applied. The patient tolerated the procedure well without immediate post procedural complication. FINDINGS: A total of approximately 5.4 L of yellowish fluid was removed. IMPRESSION: Successful ultrasound-guided paracentesis yielding 5.4 liters of peritoneal fluid. Electronically Signed   By: Franki Cabot M.D.   On: 11/05/2017 11:45   US Paracentesis  Result Date: 10/17/2017 INDICATION: Ascites, cirrhosis, recent low volume paracentesis, continued leakage of fluid from the paracentesis site in the LEFT lower quadrant EXAM: ULTRASOUND GUIDED THERAPEUTIC PARACENTESIS MEDICATIONS: None. COMPLICATIONS: None immediate. PROCEDURE: Procedure, benefits, and risks of procedure were discussed with  patient. Written informed consent for procedure was obtained. Time out protocol followed. Adequate collection of ascites localized by ultrasound in RIGHT lower quadrant. Skin prepped and draped in usual sterile fashion. Skin and soft tissues anesthetized with 10 mL of 1% lidocaine. 5 [place] Yueh catheter placed into peritoneal cavity. 3.9 L of yellow ascitic fluid aspirated by vacuum bottle suction. Procedure tolerated well by patient without immediate complication. FINDINGS: As above IMPRESSION: Successful ultrasound-guided paracentesis yielding 3.9 liters of peritoneal fluid. Electronically Signed   By: Lavonia Dana M.D.   On: 10/17/2017 12:20   Dg Chest Port 1 View  Result Date: 11/06/2017 CLINICAL DATA:  Leukocytosis with history of lung cancer. Pericardial effusion and liver cancer. EXAM: PORTABLE CHEST 1 VIEW COMPARISON:  11/02/2017, 10/14/2017 and chest CT 10/14/2017 FINDINGS: Lungs are adequately inflated with no change in numerous bilateral small pulmonary nodules compatible with suspected metastatic disease as seen on recent CT. No confluent airspace consolidation or effusion. Cardiomediastinal silhouette and remainder of the exam is unchanged. IMPRESSION: No change in numerous small bilateral pulmonary nodules compatible with suspected metastatic disease as seen on recent chest CT. No evidence of airspace consolidation or effusion. Electronically Signed   By: Marin Olp M.D.   On: 11/06/2017 16:17   Dg Chest Port 1 View  Result Date: 11/02/2017 CLINICAL DATA:  Metastatic hepatocellular carcinoma EXAM: PORTABLE CHEST 1 VIEW COMPARISON:  Chest CT 10/14/2017 FINDINGS: Innumerable nodules throughout both lungs are again demonstrated, consistent with diffuse pulmonary metastatic disease. No superimposed **Note De-Identified [place] Obfuscation** consolidation. No pleural effusion or pneumothorax. There is shallow lung inflation. Cardiomediastinal contours are normal. IMPRESSION: Diffuse nodular pulmonary metastatic disease. Electronically  Signed   By: Ulyses Jarred M.D.   On: 11/02/2017 16:52   Ir Paracentesis  Result Date: 10/30/2017 INDICATION: Recurrent ascites likely due to hepatocellular carcinoma with metastasis. Request for therapeutic paracentesis. EXAM: ULTRASOUND GUIDED THERAPEUTIC PARACENTESIS MEDICATIONS: 10 mL 2% lidocaine. COMPLICATIONS: None immediate. PROCEDURE: Informed written consent was obtained from the patient after a discussion of the risks, benefits and alternatives to treatment. A timeout was performed prior to the initiation of the procedure. Initial ultrasound scanning demonstrates a large amount of ascites within the right lower abdominal quadrant. The right lower abdomen was prepped and draped in the usual sterile fashion. 2% was used for local anesthesia. Following this, a 19 gauge, 7-cm, Yueh catheter was introduced. An ultrasound image was saved for documentation purposes. The paracentesis was performed. The catheter was removed and a dressing was applied. The patient tolerated the procedure well without immediate post procedural complication. FINDINGS: A total of approximately 5.6L of yellow fluid was removed. Samples were not sent to the laboratory as requested by the clinical team. IMPRESSION: Successful ultrasound-guided paracentesis yielding 5.6 liters of peritoneal fluid. Read by Candiss Norse, PA-C Electronically Signed   By: Marybelle Killings M.D.   On: 10/30/2017 16:17      Subjective: Pt without complaints. Pt says that his leg feels much better and he has been ambulating without any pain on his leg.  He wants to go home.    Discharge Exam: Vitals:   11/07/17 0714 11/07/17 0721  BP:  127/80  Pulse:  91  Resp:  18  Temp:  98.1 F (36.7 C)  SpO2: 93% 93%   Vitals:   11/06/17 1350 11/06/17 2040 11/07/17 0714 11/07/17 0721  BP: 128/77 124/83  127/80  Pulse: 85 91  91  Resp: 18 18  18   Temp: 98.2 F (36.8 C) 98.4 F (36.9 C)  98.1 F (36.7 C)  TempSrc: Oral Oral  Oral  SpO2: 96% 94%  93% 93%  Weight:      Height:       General exam: Appears calm and comfortable.   Respiratory system: Clear to auscultation. Respiratory effort normal. Cardiovascular system: S1 & S2 heard, RRR. No JVD, murmurs, rubs, gallops or clicks. No pedal edema. Gastrointestinal system: Abdomen is mildly distended, soft and nontender. No organomegaly or masses felt. Normal bowel sounds heard. Central nervous system: Alert and oriented. No focal neurological deficits. Extremities: Symmetric 5 x 5 power. Skin: much improved erythema and swelling of left lower leg with some wrinkling of skin seen appears to be improving. No drainage seen.  Psychiatry: Judgement and insight appear normal. Mood & affect appropriate.      The results of significant diagnostics from this hospitalization (including imaging, microbiology, ancillary and laboratory) are listed below for reference.     Microbiology: Recent Results (from the past 240 hour(s))  Culture, blood (routine x 2)     Status: None   Collection Time: 11/02/17  5:44 PM  Result Value Ref Range Status   Specimen Description BLOOD LEFT ARM  Final   Special Requests   Final    BOTTLES DRAWN AEROBIC AND ANAEROBIC Blood Culture adequate volume   Culture   Final    NO GROWTH 5 DAYS Performed at [facility], [address], [place], [place] [address]    Report Status 11/07/2017 FINAL  Final  Culture, blood ( **Note De-Identified [place] Obfuscation** routine x 2)     Status: None   Collection Time: 11/02/17  5:51 PM  Result Value Ref Range Status   Specimen Description BLOOD RIGHT HAND  Final   Special Requests   Final    BOTTLES DRAWN AEROBIC ONLY Blood Culture results may not be optimal due to an inadequate volume of blood received in culture bottles   Culture   Final    NO GROWTH 5 DAYS Performed at [facility], [address], [place], [place] [address]    Report Status 11/07/2017 FINAL  Final     Labs: BNP (last 3 results) No results for input(s): BNP in the last 8760 hours. Basic  Metabolic Panel: Recent Labs  Lab 11/02/17 1636 11/03/17 0547 11/05/17 0556  NA 130* 129* 131*  K 4.4 4.0 3.9  CL 93* 95* 99  CO2 26 25 22   GLUCOSE 108* 105* 93  BUN 24* 23* 22*  CREATININE 1.20 1.16 1.04  CALCIUM 8.3* 7.8* 7.9*   Liver Function Tests: Recent Labs  Lab 11/02/17 1636  AST 273*  ALT 46*  ALKPHOS 167*  BILITOT 1.9*  PROT 7.2  ALBUMIN 2.7*   Recent Labs  Lab 11/02/17 1636  LIPASE 39   No results for input(s): AMMONIA in the last 168 hours. CBC: Recent Labs  Lab 11/02/17 1636 11/03/17 0547 11/04/17 0726 11/05/17 0556 11/06/17 0555 11/07/17 0613  WBC 12.0* 10.1 10.0 11.1* 12.7* 13.7*  NEUTROABS 9.5*  --   --   --   --   --   HGB 12.7* 11.3* 11.0* 11.0* 11.2* 11.2*  HCT 38.6* 35.2* 33.3* 33.8* 34.5* 34.5*  MCV 91.3 91.4 90.2 90.1 90.1 90.1  PLT 204 204 186 204 166 164   Cardiac Enzymes: No results for input(s): CKTOTAL, CKMB, CKMBINDEX, TROPONINI in the last 168 hours. BNP: Invalid input(s): POCBNP CBG: No results for input(s): GLUCAP in the last 168 hours. D-Dimer No results for input(s): DDIMER in the last 72 hours. Hgb A1c No results for input(s): HGBA1C in the last 72 hours. Lipid Profile No results for input(s): CHOL, HDL, LDLCALC, TRIG, CHOLHDL, LDLDIRECT in the last 72 hours. Thyroid function studies No results for input(s): TSH, T4TOTAL, T3FREE, THYROIDAB in the last 72 hours.  Invalid input(s): FREET3 Anemia work up No results for input(s): VITAMINB12, FOLATE, FERRITIN, TIBC, IRON, RETICCTPCT in the last 72 hours. Urinalysis    Component Value Date/Time   COLORURINE BROWN (A) 02/07/2013 1730   APPEARANCEUR CLOUDY (A) 02/07/2013 1730   LABSPEC >1.030 (H) 02/07/2013 1730   PHURINE 5.0 02/07/2013 1730   GLUCOSEU NEGATIVE 02/07/2013 1730   HGBUR NEGATIVE 02/07/2013 1730   BILIRUBINUR NEGATIVE 02/07/2013 1730   KETONESUR TRACE (A) 02/07/2013 1730   PROTEINUR 30 (A) 02/07/2013 1730   UROBILINOGEN 4.0 (H) 02/07/2013 1730    NITRITE POSITIVE (A) 02/07/2013 1730   LEUKOCYTESUR NEGATIVE 02/07/2013 1730   Sepsis Labs Invalid input(s): PROCALCITONIN,  WBC,  LACTICIDVEN Microbiology Recent Results (from the past 240 hour(s))  Culture, blood (routine x 2)     Status: None   Collection Time: 11/02/17  5:44 PM  Result Value Ref Range Status   Specimen Description BLOOD LEFT ARM  Final   Special Requests   Final    BOTTLES DRAWN AEROBIC AND ANAEROBIC Blood Culture adequate volume   Culture   Final    NO GROWTH 5 DAYS Performed at [facility], [address] [place], [place] [address]    Report Status 11/07/2017 FINAL  Final **Note De-Identified [place] Obfuscation** Culture, blood (routine x 2)     Status: None   Collection Time: 11/02/17  5:51 PM  Result Value Ref Range Status   Specimen Description BLOOD RIGHT HAND  Final   Special Requests   Final    BOTTLES DRAWN AEROBIC ONLY Blood Culture results may not be optimal due to an inadequate volume of blood received in culture bottles   Culture   Final    NO GROWTH 5 DAYS Performed at [facility], [address], [place], [place] [address]    Report Status 11/07/2017 FINAL  Final    Time coordinating discharge: 32 MINS  SIGNED:  Irwin Brakeman, MD  [facility] 11/07/2017, 9:52 AM Pager [phone]  If 7PM-7AM, please contact night-coverage [email or web address] Password [email or web address]

## 2017-11-07 NOTE — Discharge Instructions (Signed)
**Note De-Identified [place] Obfuscation** Please see your primary care physician in 3 days to have your PT/INR checked.   Please go see the oncologist in 1 week.  Seek medical care or return to ED if symptoms come back, worsen or new problem develops.   Follow with Primary MD  [place]  and other consultants as instructed your Hospitalist MD  Please get a complete blood count and chemistry panel checked by your Primary MD at your next visit, and again as instructed by your Primary MD.  Get Medicines reviewed and adjusted: Please take all your medications with you for your next visit with your Primary MD  Laboratory/radiological data: Please request your Primary MD to go over all hospital tests and procedure/radiological results at the follow up, please ask your Primary MD to get all Hospital records sent to his/her office.  In some cases, they will be blood work, cultures and biopsy results pending at the time of your discharge. Please request that your primary care M.D. follows up on these results.  Also Note the following: If you experience worsening of your admission symptoms, develop shortness of breath, life threatening emergency, suicidal or homicidal thoughts you must seek medical attention immediately by calling 911 or calling your MD immediately  if symptoms less severe.  You must read complete instructions/literature along with all the possible adverse reactions/side effects for all the Medicines you take and that have been prescribed to you. Take any new Medicines after you have completely understood and accpet all the possible adverse reactions/side effects.   Do not drive when taking Pain medications or sleeping medications (Benzodaizepines)  Do not take more than prescribed Pain, Sleep and Anxiety Medications. It is not advisable to combine anxiety,sleep and pain medications without talking with your primary care practitioner  Special Instructions: If you have smoked or chewed Tobacco  in the last 2  yrs please stop smoking, stop any regular Alcohol  and or any Recreational drug use.  Wear Seat belts while driving.  Please note: You were cared for by a hospitalist during your hospital stay. Once you are discharged, your primary care physician will handle any further medical issues. Please note that NO REFILLS for any discharge medications will be authorized once you are discharged, as it is imperative that you return to your primary care physician (or establish a relationship with a primary care physician if you do not have one) for your post hospital discharge needs so that they can reassess your need for medications and monitor your lab values.

## 2017-11-11 ENCOUNTER — Inpatient Hospital Stay (HOSPITAL_COMMUNITY)
Admission: EM | Admit: 2017-11-11 | Discharge: 2017-11-12 | DRG: 082 | Disposition: A | Discharge status: Hospice | Payer: Medicaid Other | Attending: Internal Medicine | Admitting: Internal Medicine

## 2017-11-11 ENCOUNTER — Other Ambulatory Visit: Payer: Self-pay

## 2017-11-11 ENCOUNTER — Emergency Department (HOSPITAL_COMMUNITY): Payer: Medicaid Other

## 2017-11-11 ENCOUNTER — Encounter (HOSPITAL_COMMUNITY): Payer: Self-pay | Admitting: *Deleted

## 2017-11-11 DIAGNOSIS — W1830XA Fall on same level, unspecified, initial encounter: Secondary | ICD-10-CM | POA: Diagnosis present

## 2017-11-11 DIAGNOSIS — L0390 Cellulitis, unspecified: Secondary | ICD-10-CM | POA: Diagnosis present

## 2017-11-11 DIAGNOSIS — Z781 Physical restraint status: Secondary | ICD-10-CM

## 2017-11-11 DIAGNOSIS — C220 Liver cell carcinoma: Secondary | ICD-10-CM | POA: Diagnosis present

## 2017-11-11 DIAGNOSIS — R4182 Altered mental status, unspecified: Secondary | ICD-10-CM | POA: Diagnosis present

## 2017-11-11 DIAGNOSIS — E860 Dehydration: Secondary | ICD-10-CM

## 2017-11-11 DIAGNOSIS — Z515 Encounter for palliative care: Secondary | ICD-10-CM | POA: Diagnosis present

## 2017-11-11 DIAGNOSIS — Z7901 Long term (current) use of anticoagulants: Secondary | ICD-10-CM

## 2017-11-11 DIAGNOSIS — Z66 Do not resuscitate: Secondary | ICD-10-CM | POA: Diagnosis present

## 2017-11-11 DIAGNOSIS — E871 Hypo-osmolality and hyponatremia: Secondary | ICD-10-CM | POA: Diagnosis present

## 2017-11-11 DIAGNOSIS — S065X9A Traumatic subdural hemorrhage with loss of consciousness of unspecified duration, initial encounter: Principal | ICD-10-CM | POA: Diagnosis present

## 2017-11-11 DIAGNOSIS — I10 Essential (primary) hypertension: Secondary | ICD-10-CM | POA: Diagnosis present

## 2017-11-11 DIAGNOSIS — Z6825 Body mass index (BMI) 25.0-25.9, adult: Secondary | ICD-10-CM | POA: Diagnosis not present

## 2017-11-11 DIAGNOSIS — L03116 Cellulitis of left lower limb: Secondary | ICD-10-CM

## 2017-11-11 DIAGNOSIS — C7800 Secondary malignant neoplasm of unspecified lung: Secondary | ICD-10-CM | POA: Diagnosis present

## 2017-11-11 DIAGNOSIS — F419 Anxiety disorder, unspecified: Secondary | ICD-10-CM | POA: Diagnosis present

## 2017-11-11 DIAGNOSIS — K920 Hematemesis: Secondary | ICD-10-CM | POA: Diagnosis present

## 2017-11-11 DIAGNOSIS — R740 Nonspecific elevation of levels of transaminase and lactic acid dehydrogenase [LDH]: Secondary | ICD-10-CM

## 2017-11-11 DIAGNOSIS — J439 Emphysema, unspecified: Secondary | ICD-10-CM | POA: Diagnosis present

## 2017-11-11 DIAGNOSIS — J432 Centrilobular emphysema: Secondary | ICD-10-CM

## 2017-11-11 DIAGNOSIS — E43 Unspecified severe protein-calorie malnutrition: Secondary | ICD-10-CM | POA: Diagnosis present

## 2017-11-11 DIAGNOSIS — S065XAA Traumatic subdural hemorrhage with loss of consciousness status unknown, initial encounter: Secondary | ICD-10-CM | POA: Diagnosis present

## 2017-11-11 DIAGNOSIS — D689 Coagulation defect, unspecified: Secondary | ICD-10-CM

## 2017-11-11 DIAGNOSIS — K7031 Alcoholic cirrhosis of liver with ascites: Secondary | ICD-10-CM | POA: Diagnosis present

## 2017-11-11 DIAGNOSIS — J449 Chronic obstructive pulmonary disease, unspecified: Secondary | ICD-10-CM | POA: Diagnosis present

## 2017-11-11 DIAGNOSIS — I4581 Long QT syndrome: Secondary | ICD-10-CM | POA: Diagnosis present

## 2017-11-11 DIAGNOSIS — R188 Other ascites: Secondary | ICD-10-CM | POA: Diagnosis present

## 2017-11-11 DIAGNOSIS — I81 Portal vein thrombosis: Secondary | ICD-10-CM | POA: Diagnosis present

## 2017-11-11 DIAGNOSIS — G9340 Encephalopathy, unspecified: Secondary | ICD-10-CM | POA: Diagnosis present

## 2017-11-11 DIAGNOSIS — F17210 Nicotine dependence, cigarettes, uncomplicated: Secondary | ICD-10-CM | POA: Diagnosis present

## 2017-11-11 DIAGNOSIS — K7030 Alcoholic cirrhosis of liver without ascites: Secondary | ICD-10-CM | POA: Diagnosis present

## 2017-11-11 DIAGNOSIS — K7460 Unspecified cirrhosis of liver: Secondary | ICD-10-CM

## 2017-11-11 DIAGNOSIS — R7401 Elevation of levels of liver transaminase levels: Secondary | ICD-10-CM | POA: Diagnosis present

## 2017-11-11 DIAGNOSIS — F17200 Nicotine dependence, unspecified, uncomplicated: Secondary | ICD-10-CM | POA: Diagnosis present

## 2017-11-11 LAB — CBC WITH DIFFERENTIAL/PLATELET
BASOS ABS: 0 10*3/uL (ref 0.0–0.1)
BASOS PCT: 0 %
EOS ABS: 0 10*3/uL (ref 0.0–0.7)
EOS PCT: 0 %
HCT: 32 % — ABNORMAL LOW (ref 39.0–52.0)
Hemoglobin: 10.2 g/dL — ABNORMAL LOW (ref 13.0–17.0)
Lymphocytes Relative: 5 %
Lymphs Abs: 0.9 10*3/uL (ref 0.7–4.0)
MCH: 28.9 pg (ref 26.0–34.0)
MCHC: 31.9 g/dL (ref 30.0–36.0)
MCV: 90.7 fL (ref 78.0–100.0)
MONO ABS: 0.7 10*3/uL (ref 0.1–1.0)
MONOS PCT: 4 %
Neutro Abs: 17.2 10*3/uL — ABNORMAL HIGH (ref 1.7–7.7)
Neutrophils Relative %: 91 %
PLATELETS: 208 10*3/uL (ref 150–400)
RBC: 3.53 MIL/uL — ABNORMAL LOW (ref 4.22–5.81)
RDW: 20.2 % — AB (ref 11.5–15.5)
WBC: 18.9 10*3/uL — ABNORMAL HIGH (ref 4.0–10.5)

## 2017-11-11 LAB — CBC
HCT: 33.1 % — ABNORMAL LOW (ref 39.0–52.0)
Hemoglobin: 10.5 g/dL — ABNORMAL LOW (ref 13.0–17.0)
MCH: 29.1 pg (ref 26.0–34.0)
MCHC: 31.7 g/dL (ref 30.0–36.0)
MCV: 91.7 fL (ref 78.0–100.0)
PLATELETS: 196 10*3/uL (ref 150–400)
RBC: 3.61 MIL/uL — AB (ref 4.22–5.81)
RDW: 21.3 % — ABNORMAL HIGH (ref 11.5–15.5)
WBC: 22.3 10*3/uL — ABNORMAL HIGH (ref 4.0–10.5)

## 2017-11-11 LAB — PROTIME-INR
INR: 2.54
INR: 1.5
INR: 1.39
INR: 1.32
PROTHROMBIN TIME: 27.1 s — AB (ref 11.4–15.2)
PROTHROMBIN TIME: 18 s — AB (ref 11.4–15.2)
PROTHROMBIN TIME: 16.9 s — AB (ref 11.4–15.2)
Prothrombin Time: 16.3 seconds — ABNORMAL HIGH (ref 11.4–15.2)

## 2017-11-11 LAB — URINALYSIS, ROUTINE W REFLEX MICROSCOPIC
BILIRUBIN URINE: NEGATIVE
Glucose, UA: NEGATIVE mg/dL
Hgb urine dipstick: NEGATIVE
KETONES UR: NEGATIVE mg/dL
Leukocytes, UA: NEGATIVE
Nitrite: NEGATIVE
PROTEIN: NEGATIVE mg/dL
Specific Gravity, Urine: 1.015 (ref 1.005–1.030)
pH: 5 (ref 5.0–8.0)

## 2017-11-11 LAB — COMPREHENSIVE METABOLIC PANEL
ALBUMIN: 2.4 g/dL — AB (ref 3.5–5.0)
ALT: 32 U/L (ref 0–44)
ANION GAP: 11 (ref 5–15)
AST: 209 U/L — AB (ref 15–41)
Alkaline Phosphatase: 221 U/L — ABNORMAL HIGH (ref 38–126)
BILIRUBIN TOTAL: 2.9 mg/dL — AB (ref 0.3–1.2)
BUN: 33 mg/dL — AB (ref 6–20)
CHLORIDE: 97 mmol/L — AB (ref 98–111)
CO2: 20 mmol/L — ABNORMAL LOW (ref 22–32)
Calcium: 8.4 mg/dL — ABNORMAL LOW (ref 8.9–10.3)
Creatinine, Ser: 1.17 mg/dL (ref 0.61–1.24)
GFR calc Af Amer: >60 mL/min (ref >60)
GFR calc non Af Amer: >60 mL/min (ref >60)
GLUCOSE: 105 mg/dL — AB (ref 70–99)
POTASSIUM: 5 mmol/L (ref 3.5–5.1)
Sodium: 128 mmol/L — ABNORMAL LOW (ref 135–145)
TOTAL PROTEIN: 7 g/dL (ref 6.5–8.1)

## 2017-11-11 LAB — MAGNESIUM: Magnesium: 2.3 mg/dL (ref 1.7–2.4)

## 2017-11-11 LAB — TYPE AND SCREEN
ABO/RH(D): A POS
ANTIBODY SCREEN: NEGATIVE

## 2017-11-11 LAB — AMMONIA: AMMONIA: 70 umol/L — AB (ref 9–35)

## 2017-11-11 LAB — ETHANOL: Alcohol, Ethyl (B): <10 mg/dL (ref <10)

## 2017-11-11 LAB — PHOSPHORUS: Phosphorus: 6.2 mg/dL — ABNORMAL HIGH (ref 2.5–4.6)

## 2017-11-11 LAB — POC OCCULT BLOOD, ED: FECAL OCCULT BLD: POSITIVE — AB

## 2017-11-11 MED ORDER — POTASSIUM CHLORIDE CRYS ER 20 MEQ PO TBCR
20.0000 meq | EXTENDED_RELEASE_TABLET | Freq: Every day | ORAL | Status: DC
  Administered 2017-11-11: 20 meq via ORAL
  Filled 2017-11-11: qty 1

## 2017-11-11 MED ORDER — NICOTINE 21 MG/24HR TD PT24
21.0000 mg | MEDICATED_PATCH | Freq: Every day | TRANSDERMAL | Status: DC
  Administered 2017-11-11 – 2017-11-12 (×2): 21 mg via TRANSDERMAL
  Filled 2017-11-11 (×2): qty 1

## 2017-11-11 MED ORDER — FOLIC ACID 1 MG PO TABS
1.0000 mg | ORAL_TABLET | Freq: Every day | ORAL | Status: DC
  Administered 2017-11-11: 1 mg via ORAL
  Filled 2017-11-11: qty 1

## 2017-11-11 MED ORDER — FENTANYL CITRATE (PF) 100 MCG/2ML IJ SOLN
50.0000 ug | Freq: Once | INTRAMUSCULAR | Status: AC
  Administered 2017-11-11: 50 ug via INTRAVENOUS
  Filled 2017-11-11: qty 2

## 2017-11-11 MED ORDER — DOXYCYCLINE HYCLATE 100 MG PO TABS
100.0000 mg | ORAL_TABLET | Freq: Two times a day (BID) | ORAL | Status: DC
  Administered 2017-11-11 (×2): 100 mg via ORAL
  Filled 2017-11-11 (×2): qty 1

## 2017-11-11 MED ORDER — LEVETIRACETAM IN NACL 500 MG/100ML IV SOLN
500.0000 mg | Freq: Once | INTRAVENOUS | Status: AC
  Administered 2017-11-11: 500 mg via INTRAVENOUS
  Filled 2017-11-11: qty 100

## 2017-11-11 MED ORDER — TRAMADOL HCL 50 MG PO TABS: 50.0000 mg | ORAL_TABLET | Freq: Three times a day (TID) | ORAL | Status: DC | PRN

## 2017-11-11 MED ORDER — VITAMIN K1 10 MG/ML IJ SOLN
10.0000 mg | INTRAVENOUS | Status: AC
  Administered 2017-11-11: 10 mg via INTRAVENOUS
  Filled 2017-11-11: qty 1

## 2017-11-11 MED ORDER — VITAMIN K1 10 MG/ML IJ SOLN
INTRAMUSCULAR | Status: AC
  Filled 2017-11-11: qty 1

## 2017-11-11 MED ORDER — PROTHROMBIN COMPLEX CONC HUMAN 500 UNITS IV KIT
2000.0000 [IU] | PACK | Status: AC
  Administered 2017-11-11: 2000 [IU] via INTRAVENOUS
  Filled 2017-11-11: qty 2000

## 2017-11-11 MED ORDER — LEVETIRACETAM IN NACL 500 MG/100ML IV SOLN
INTRAVENOUS | Status: AC
  Filled 2017-11-11: qty 100

## 2017-11-11 MED ORDER — SODIUM CHLORIDE 0.9% FLUSH: 3.0000 mL | INTRAVENOUS | Status: DC | PRN

## 2017-11-11 MED ORDER — LEVETIRACETAM IN NACL 500 MG/100ML IV SOLN
500.0000 mg | Freq: Two times a day (BID) | INTRAVENOUS | Status: DC
  Administered 2017-11-11 – 2017-11-12 (×2): 500 mg via INTRAVENOUS
  Filled 2017-11-11 (×4): qty 100

## 2017-11-11 MED ORDER — SODIUM CHLORIDE 0.9 % IV SOLN
50.0000 ug/h | INTRAVENOUS | Status: DC
  Administered 2017-11-12: 50 ug/h via INTRAVENOUS
  Filled 2017-11-11 (×5): qty 1

## 2017-11-11 MED ORDER — FAMOTIDINE IN NACL 20-0.9 MG/50ML-% IV SOLN
20.0000 mg | Freq: Two times a day (BID) | INTRAVENOUS | Status: DC
  Administered 2017-11-11 (×2): 20 mg via INTRAVENOUS
  Filled 2017-11-11 (×2): qty 50

## 2017-11-11 MED ORDER — SODIUM CHLORIDE 0.9 % IV SOLN: 250.0000 mL | INTRAVENOUS | Status: DC | PRN

## 2017-11-11 MED ORDER — LACTULOSE 10 GM/15ML PO SOLN
15.0000 g | Freq: Three times a day (TID) | ORAL | Status: DC
  Administered 2017-11-11 (×3): 15 g via ORAL
  Filled 2017-11-11 (×3): qty 30

## 2017-11-11 MED ORDER — SODIUM CHLORIDE 0.9 % IV SOLN: INTRAVENOUS | Status: DC

## 2017-11-11 MED ORDER — UMECLIDINIUM-VILANTEROL 62.5-25 MCG/INH IN AEPB
1.0000 | INHALATION_SPRAY | Freq: Every day | RESPIRATORY_TRACT | Status: DC
  Administered 2017-11-12: 1 via RESPIRATORY_TRACT
  Filled 2017-11-11: qty 14

## 2017-11-11 MED ORDER — IOPAMIDOL (ISOVUE-370) INJECTION 76%
100.0000 mL | Freq: Once | INTRAVENOUS | Status: AC | PRN
  Administered 2017-11-11: 100 mL via INTRAVENOUS

## 2017-11-11 MED ORDER — SODIUM CHLORIDE 0.9% FLUSH: 3.0000 mL | Freq: Two times a day (BID) | INTRAVENOUS | Status: DC

## 2017-11-11 MED ORDER — ONDANSETRON HCL 4 MG/2ML IJ SOLN: 4.0000 mg | Freq: Four times a day (QID) | INTRAMUSCULAR | Status: DC | PRN

## 2017-11-11 MED ORDER — ONDANSETRON HCL 4 MG PO TABS: 4.0000 mg | ORAL_TABLET | Freq: Four times a day (QID) | ORAL | Status: DC | PRN

## 2017-11-11 MED ORDER — FENTANYL CITRATE (PF) 100 MCG/2ML IJ SOLN
100.0000 ug | Freq: Once | INTRAMUSCULAR | Status: AC
  Administered 2017-11-11: 100 ug via INTRAVENOUS
  Filled 2017-11-11: qty 2

## 2017-11-11 MED ORDER — VITAMIN B-1 100 MG PO TABS
100.0000 mg | ORAL_TABLET | Freq: Every day | ORAL | Status: DC
  Administered 2017-11-11: 100 mg via ORAL
  Filled 2017-11-11: qty 1

## 2017-11-11 MED ORDER — ONDANSETRON HCL 4 MG/2ML IJ SOLN
4.0000 mg | Freq: Once | INTRAMUSCULAR | Status: AC
  Administered 2017-11-11: 4 mg via INTRAVENOUS
  Filled 2017-11-11: qty 2

## 2017-11-11 MED ORDER — LORAZEPAM 2 MG/ML IJ SOLN
0.5000 mg | Freq: Four times a day (QID) | INTRAMUSCULAR | Status: DC | PRN
  Administered 2017-11-11 – 2017-11-12 (×2): 0.5 mg via INTRAVENOUS
  Filled 2017-11-11 (×2): qty 1

## 2017-11-11 NOTE — ED Notes (Signed)
**Note De-Identified [place] Obfuscation** Urine return of 300 ml. After cath insertion. EDP  Notifed. Bladder scan showing >999

## 2017-11-11 NOTE — ED Notes (Signed)
**Note De-Identified [place] Obfuscation** Lab into draw PT/ INR

## 2017-11-11 NOTE — ED Notes (Signed)
**Note De-identified [place] Obfuscation** Pt and family updated on plan of care,  

## 2017-11-11 NOTE — Consult Note (Signed)
**Note De-Identified Cormier Obfuscation** Chief Complaint   Chief Complaint  Patient presents with  . Hematemesis    HPI   HPI: Marc Wilson is a 62 y.o. male with past medical history significant for liver cirrhosis, hepatocellular adenocarcinoma with lung mets, acities, portal vein thrombosis on coumadin, COPD who presented to [facility] earlier today due to hematemesis. He had fallen 2 days prior striking head. Did not seek medical attention. He underwent CT scan of head which was significant for SDH. NSY consulted due to CT head findings. TH admitted due to medical comorbidities.   Patient is altered and unable to answer my questions. No family at bedside.   Patient Active Problem List   Diagnosis Date Noted  . Subdural hematoma (HCC) 11/11/2017  . Severe protein-calorie malnutrition (HCC) 11/11/2017  . Transaminitis 11/11/2017  . Hyponatremia   . Cirrhosis (HCC)   . 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(HCC)   . Cellulitis 11/02/2017  . Portal vein thrombosis 11/02/2017  . Liver mass 10/17/2017  . Essential hypertension 08/21/2017  . Compression fracture of first lumbar vertebra (HCC) 11/29/2016  . COPD (chronic obstructive pulmonary disease) with emphysema (HCC) 11/29/2016  . Tobacco use disorder, severe, dependence 11/24/2016  . Skin lesion of face 11/24/2016  . Chest skin lesion 11/24/2016  . Concern about skin cancer without diagnosis 11/24/2016  . Weight loss, non-intentional 11/24/2016  . Chronic cough 11/24/2016  . Alcohol use disorder, moderate, dependence (HCC) 11/24/2016  . 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 Hepatitis C virus infection 02/19/2013  . Ascites 02/10/2013  . Pericardial effusion 02/07/2013  . Lytic lesion of bone on x-ray 02/07/2013  . Liver cirrhosis, alcoholic (HCC) 02/07/2013    PMH: Past Medical History:  Diagnosis Date  . Acute renal failure (HCC) 02/07/2013  . Alcoholism (HCC)   . Ascites   . Cirrhosis (HCC)   . Hepatitis C 02/10/2013  . Liver cancer  (HCC)   . Lung cancer (HCC)   . Lytic lesion of bone on x-ray 02/07/2013  . Pericardial effusion 02/07/2013  . UTI (urinary tract infection) 02/07/2013    PSH: Past Surgical History:  Procedure Laterality Date  . ESOPHAGOGASTRODUODENOSCOPY N/A 02/12/2013   Procedure: ESOPHAGOGASTRODUODENOSCOPY (EGD);  Surgeon: Iva Boop, MD;  Location: [facility] ENDOSCOPY;  Service: Endoscopy;  Laterality: N/A;  . IR PARACENTESIS  10/30/2017    Medications Prior to Admission  Medication Sig Dispense Refill Last Dose  . doxycycline (VIBRAMYCIN) 100 MG capsule Take 1 capsule (100 mg total) by mouth 2 (two) times daily for 7 days. 14 capsule 0   . furosemide (LASIX) 20 MG tablet Take 2 tablets (40 mg total) by mouth daily. 30 tablet 2 11/02/2017 at Unknown time  . potassium chloride SA (K-DUR,KLOR-CON) 20 MEQ tablet Take 1 tablet (20 mEq total) by mouth daily. 30 tablet 2 11/02/2017 at Unknown time  . spironolactone (ALDACTONE) 25 MG tablet Take 1 tablet (25 mg total) by mouth daily. 30 tablet 2 11/02/2017 at Unknown time  . umeclidinium-vilanterol (ANORO ELLIPTA) 62.5-25 MCG/INH AEPB Inhale 1 puff into the lungs daily. **Every day inhaler** 1 each 12 11/02/2017 at Unknown time  . warfarin (COUMADIN) 5 MG tablet Take 1 tablet (5 mg total) by mouth daily. 30 tablet 0 11/02/2017 at 10AM    SH: Social History   Tobacco Use  . Smoking status: Current Every Day Smoker    Packs/day: 0.25    Years: 40.00    Pack years: 10.00    Types: Cigarettes  . Smokeless tobacco: Never **Note De-Identified Kunde Obfuscation** Used  Substance Use Topics  . Alcohol use: Not Currently    Alcohol/week: 12.0 standard drinks    Types: 12 Cans of beer per week    Comment: 12 beers daily x 40 years- none in 4 or 5 weeks  . Drug use: No    Types: Marijuana    Comment: has d/c as of 01/2013    MEDS: Prior to Admission medications   Medication Sig Start Date End Date Taking? Authorizing Provider  doxycycline (VIBRAMYCIN) 100 MG capsule Take 1 capsule (100 mg total) by mouth  2 (two) times daily for 7 days. 11/07/17 11/14/17  Johnson, Clanford L, MD  furosemide (LASIX) 20 MG tablet Take 2 tablets (40 mg total) by mouth daily. 10/17/17   Lockamy, Randi L, NP-C  potassium chloride SA (K-DUR,KLOR-CON) 20 MEQ tablet Take 1 tablet (20 mEq total) by mouth daily. 10/17/17   Lockamy, Randi L, NP-C  spironolactone (ALDACTONE) 25 MG tablet Take 1 tablet (25 mg total) by mouth daily. 10/17/17   Lockamy, Randi L, NP-C  umeclidinium-vilanterol (ANORO ELLIPTA) 62.5-25 MCG/INH AEPB Inhale 1 puff into the lungs daily. **Every day inhaler** 12/15/16   Delynn Flavin M, DO  warfarin (COUMADIN) 5 MG tablet Take 1 tablet (5 mg total) by mouth daily. 10/14/17 2017-11-29  Eber Hong, MD    ALLERGY: No Known Allergies  Social History   Tobacco Use  . Smoking status: Current Every Day Smoker    Packs/day: 0.25    Years: 40.00    Pack years: 10.00    Types: Cigarettes  . Smokeless tobacco: Never Used  Substance Use Topics  . Alcohol use: Not Currently    Alcohol/week: 12.0 standard drinks    Types: 12 Cans of beer per week    Comment: 12 beers daily x 40 years- none in 4 or 5 weeks     Family History  Problem Relation Age of Onset  . Diabetes type II Mother   . Cirrhosis Mother   . Diabetes type II Father   . Alcoholism Father   . Cirrhosis Father   . Cancer Sister        CLL  . Cancer Sister        gastric carcinoid  . Diabetes type II Brother   . Cancer Brother   . Pulmonary embolism Daughter   . Bipolar disorder Daughter   . Depression Daughter   . Colon cancer Neg Hx   . Throat cancer Neg Hx   . Heart disease Neg Hx   . Kidney disease Neg Hx      ROS   ROS unable to obtain  Exam   Vitals:   11/11/17 1135 11/11/17 1137  BP: 116/69 92/60  Pulse:    Resp: 14 16  Temp:    SpO2:      Frail elderly male Resting comfortably Intermittently follows commands with extensive encouragement Does move all extremities.  Unable to assess strength  Results -  Imaging/Labs   Results for orders placed or performed during the hospital encounter of 11/11/17 (from the past 48 hour(s))  POC occult blood, ED     Status: Abnormal   Collection Time: 11/11/17  4:18 AM  Result Value Ref Range   Fecal Occult Bld POSITIVE (A) NEGATIVE  CBC with Differential/Platelet     Status: Abnormal   Collection Time: 11/11/17  4:32 AM  Result Value Ref Range   WBC 18.9 (H) 4.0 - 10.5 K/uL   RBC 3.53 (L) 4.22 - 5.81 **Note De-Identified Cadet Obfuscation** MIL/uL   Hemoglobin 10.2 (L) 13.0 - 17.0 g/dL   HCT 59.5 (L) 63.8 - 75.6 %   MCV 90.7 78.0 - 100.0 fL   MCH 28.9 26.0 - 34.0 pg   MCHC 31.9 30.0 - 36.0 g/dL   RDW 43.3 (H) 29.5 - 18.8 %   Platelets 208 150 - 400 K/uL   Neutrophils Relative % 91 %   Neutro Abs 17.2 (H) 1.7 - 7.7 K/uL   Lymphocytes Relative 5 %   Lymphs Abs 0.9 0.7 - 4.0 K/uL   Monocytes Relative 4 %   Monocytes Absolute 0.7 0.1 - 1.0 K/uL   Eosinophils Relative 0 %   Eosinophils Absolute 0.0 0.0 - 0.7 K/uL   Basophils Relative 0 %   Basophils Absolute 0.0 0.0 - 0.1 K/uL    Comment: Performed at [facility], [address], [place], [place] [address]  Comprehensive metabolic panel     Status: Abnormal   Collection Time: 11/11/17  4:32 AM  Result Value Ref Range   Sodium 128 (L) 135 - 145 mmol/L   Potassium 5.0 3.5 - 5.1 mmol/L   Chloride 97 (L) 98 - 111 mmol/L   CO2 20 (L) 22 - 32 mmol/L   Glucose, Bld 105 (H) 70 - 99 mg/dL   BUN 33 (H) 6 - 20 mg/dL   Creatinine, Ser 6.30 0.61 - 1.24 mg/dL   Calcium 8.4 (L) 8.9 - 10.3 mg/dL   Total Protein 7.0 6.5 - 8.1 g/dL   Albumin 2.4 (L) 3.5 - 5.0 g/dL   AST 160 (H) 15 - 41 U/L   ALT 32 0 - 44 U/L   Alkaline Phosphatase 221 (H) 38 - 126 U/L   Total Bilirubin 2.9 (H) 0.3 - 1.2 mg/dL   GFR calc non Af Amer >60 >60 mL/min   GFR calc Af Amer >60 >60 mL/min    Comment: (NOTE) The eGFR has been calculated using the CKD EPI equation. This calculation has not been validated in all clinical situations. eGFR's persistently <60 mL/min  signify possible Chronic Kidney Disease.    Anion gap 11 5 - 15    Comment: Performed at [facility], [address], [place], [place] [address]  Protime-INR     Status: Abnormal   Collection Time: 11/11/17  4:32 AM  Result Value Ref Range   Prothrombin Time 27.1 (H) 11.4 - 15.2 seconds   INR 2.54     Comment: Performed at [facility], [address], [place], [place] [address]  Type and screen     Status: None   Collection Time: 11/11/17  4:33 AM  Result Value Ref Range   ABO/RH(D) A POS    Antibody Screen NEG    Sample Expiration      11/14/2017 Performed at [facility], [address] [place], [place] [address]   Ammonia     Status: Abnormal   Collection Time: 11/11/17  4:33 AM  Result Value Ref Range   Ammonia 70 (H) 9 - 35 umol/L    Comment: Performed at [facility], [address] [place], [place] [address]  Ethanol     Status: None   Collection Time: 11/11/17  4:33 AM  Result Value Ref Range   Alcohol, Ethyl (B) <10 <10 mg/dL    Comment: (NOTE) Lowest detectable limit for serum alcohol is 10 mg/dL. For medical purposes only. Performed at [facility], [address], [place], [place] [address]   Urinalysis, Routine w reflex microscopic **Note De-Identified Slaby Obfuscation** Status: Abnormal   Collection Time: 11/11/17  8:41 AM  Result Value Ref Range   Color, Urine AMBER (A) YELLOW    Comment: BIOCHEMICALS MAY BE AFFECTED BY COLOR   APPearance CLEAR CLEAR   Specific Gravity, Urine 1.015 1.005 - 1.030   pH 5.0 5.0 - 8.0   Glucose, UA NEGATIVE NEGATIVE mg/dL   Hgb urine dipstick NEGATIVE NEGATIVE   Bilirubin Urine NEGATIVE NEGATIVE   Ketones, ur NEGATIVE NEGATIVE mg/dL   Protein, ur NEGATIVE NEGATIVE mg/dL   Nitrite NEGATIVE NEGATIVE   Leukocytes, UA NEGATIVE NEGATIVE    Comment: Performed at [facility], [address], [place], [place] [address]  Protime-INR     Status: Abnormal   Collection Time: 11/11/17  9:37 AM  Result Value Ref Range   Prothrombin Time 18.0 (H) 11.4 - 15.2 seconds    INR 1.50     Comment: Performed at [facility], [address] [place], [place] [address]    Ct Angio Head W Or Wo Contrast  Result Date: 11/11/2017 CLINICAL DATA:  Cerebral hemorrhage suspected. Anticoagulated/coagulopathic patient with metastatic hepatoma. EXAM: CT ANGIOGRAPHY HEAD TECHNIQUE: Multidetector CT imaging of the head was performed using the standard protocol during bolus administration of intravenous contrast. Multiplanar CT image reconstructions and MIPs were obtained to evaluate the vascular anatomy. CONTRAST:  ISOVUE-370 IOPAMIDOL (ISOVUE-370) INJECTION 76% COMPARISON:  Head CT from earlier today FINDINGS: CTA HEAD Anterior circulation: Vessels are smooth and widely patent. No atheromatous changes or aneurysm. Negative for stenosis. No evidence of vascular malformation. Posterior circulation: Vessels are smooth and widely patent. No aneurysm or atheromatous changes. Negative for stenosis. Venous sinuses: Patent Anatomic variants: None significant Delayed phase: Known subdural hemorrhage along the posterior left cerebral convexity low-density areas likely reflecting serum. Maximal thickness in the left occipital region is 18 mm. Thin volume blood extends along the left tentorium. Trace hygroma suspected along the right frontal parietal convexity. IMPRESSION: 1. Subdural hematoma along the left occipital parietal convexity without progression from recent scan. 2. No evidence of underlying vascular lesion. No visible enhancing mass. Electronically Signed   By: Marnee Spring M.D.   On: 11/11/2017 07:31   Ct Head Wo Contrast  Result Date: 11/11/2017 CLINICAL DATA:  62 y/o M; history of metastatic liver cancer. Hemoptysis. EXAM: CT HEAD WITHOUT CONTRAST TECHNIQUE: Contiguous axial images were obtained from the base of the skull through the vertex without intravenous contrast. COMPARISON:  None. FINDINGS: Brain: Subdural hematoma over the left parietal convexity, falx, and tentorium.  Hemorrhage has a lobulated contour with several hyperdense blood/fluid levels suggesting an underlying mass or vascular malformation. The hematoma measures up to 2.4 cm in thickness. Mass effect from the subdural hematoma effaces the occipital horn of left lateral ventricle and results in minimal 2 mm left-to-right midline shift. No brain parenchymal hemorrhage, stroke, or downward herniation. Vascular: Mild calcific atherosclerosis of carotid siphons. Skull: Normal. Negative for fracture or focal lesion. Sinuses/Orbits: Right maxillary sinus and bilateral sphenoid sinus mucosal thickening. Chronic inflammatory changes of the walls of bilateral maxillary sinuses and the sphenoid sinus. Normal aeration of mastoid air cells. Other: None. IMPRESSION: 1. Subdural hematoma over the left parietal convexity, falx, and tentorium. Hemorrhage has a lobulated contour with blood fluid levels, possibly representing an underlying mass or vascular malformation. Consider CT angiogram of the head to further characterize. 2. Mild associated mass effect with effacement of occipital horn of left lateral ventricle into 2 mm left-to-right midline shift. 3. No brain parenchymal hemorrhage, stroke, **Note De-Identified Pascucci Obfuscation** or herniation. These results were called by telephone at the time of interpretation on 11/11/2017 at 6:22 am to Dr. Zadie Rhine , who verbally acknowledged these results. Electronically Signed   By: Mitzi Hansen M.D.   On: 11/11/2017 06:25   Ct Abdomen Pelvis W Contrast  Result Date: 11/11/2017 CLINICAL DATA:  Nausea/vomiting/diarrhea, weakness, altered mental status. Recently diagnosed hepatocellular carcinoma with multifocal lung metastases EXAM: CT ABDOMEN AND PELVIS WITH CONTRAST TECHNIQUE: Multidetector CT imaging of the abdomen and pelvis was performed using the standard protocol following bolus administration of intravenous contrast. CONTRAST:  ISOVUE-370 IOPAMIDOL (ISOVUE-370) INJECTION 76% COMPARISON:  MRI  abdomen dated 10/30/2017. CT chest dated 10/14/2017. FINDINGS: Lower chest: Innumerable pulmonary metastases throughout the lungs. Two dominant right lower lobe nodules measure 2.2 and 2.6 cm (series 14/image 16). Hepatobiliary: Multifocal/aggregate right hepatic mass measuring at least 16.9 cm in the right hepatic lobe (series 12/image 30), corresponding to suspected hepatocellular carcinoma, better evaluated on prior MRI. Underlying cirrhosis. Gallbladder is unremarkable. No intrahepatic or extrahepatic ductal dilatation. Pancreas: Within normal limits. Spleen: Within normal limits. Adrenals/Urinary Tract: Adrenal glands are within normal limits. 9 mm cyst in the anterior left lower kidney. Right kidney is within normal limits. No hydronephrosis. Bladder is within normal limits. Stomach/Bowel: Stomach is within normal limits. No evidence of bowel obstruction. Normal appendix (series 12/image 77). Vascular/Lymphatic: No evidence of abdominal aortic aneurysm. Atherosclerotic calcifications the abdominal aorta and branch vessels. Right portal vein thrombosis (series 12/image 37), likely secondary to tumor involvement. Gastroesophageal varices.  Recanalized periumbilical vein. No suspicious abdominopelvic lymphadenopathy. Reproductive: Prostate is unremarkable. Other: Large volume abdominopelvic ascites. Musculoskeletal: Degenerative changes of the lumbar spine. IMPRESSION: Large right hepatic mass, corresponding to suspected hepatocellular carcinoma, better evaluated on prior MRI. Right portal vein thrombosis of the right portal vein, likely secondary to tumor involvement. Cirrhosis. Gastroesophageal varices. Large volume abdominopelvic ascites. Innumerable pulmonary metastases, incompletely visualized. Electronically Signed   By: Charline Bills M.D.   On: 11/11/2017 07:43   Dg Chest Port 1 View  Result Date: 11/11/2017 CLINICAL DATA:  62 y/o  M; vomiting bright red blood. EXAM: PORTABLE CHEST 1 VIEW  COMPARISON:  11/06/2017 chest radiograph FINDINGS: Numerous pulmonary nodules are grossly stable. No new consolidation, effusion, or pneumothorax. Stable normal cardiac silhouette. Bones are unremarkable. IMPRESSION: Stable numerous pulmonary nodules compatible with metastatic disease. No new consolidation identified. Electronically Signed   By: Mitzi Hansen M.D.   On: 11/11/2017 05:21    Impression/Plan   62 y.o. male with traumatic SDH after falling two days prior. Unable to obtain thorough neurological exam due to altered mental status, although he does move all extremities. On coumadin. Kecentra protocol initiated by [facility].  I have reviewed case and imaging with attending. No need for acute NS intervention. In fact, patient has been made DNR/DNI by report. Goals of care will need to be discussed with family as a whole. Hold on MRI brain at this time. Keppra for seizure prophylaxis. Monitor neuro exam q 2 hours. Repeat head CT tomorrow am.

## 2017-11-11 NOTE — ED Notes (Signed)
**Note De-Identified Storr Obfuscation** Pt very restless. Flailing arms and restless in bed Stating he has to urinate. Bladder scan showed >902 ml in bladder. Dr Christy Gentles notifed gave verbal order to give fentalnyl 50 mcg and place cath

## 2017-11-11 NOTE — Progress Notes (Addendum)
**Note De-Identified Braatz Obfuscation** Patient transferred here from [facility].  Discussed with Dr. Dyann Kief.  H&P reviewed.  S: Patient noted to be confused.  Had any vomiting since he has been here.  Patient denies any headaches.  O: Noted to be stable.  Vital signs reviewed.    Lungs show diminished air entry at the bases.   S1-S2 is normal regular.   Abdomen noted to be distended with ascites.  Hepatomegaly appreciated.  Labs reviewed.  A/P: Assessment and plan as outlined in Dr. Anson Fret H&P.    Patient with fall recently and found to have subdural hematoma.  This was in the setting of warfarin.  Patient has been given reversal agents.  Continue to hold warfarin.  Discussed with neurosurgery who will be evaluating the patient and may consider MRI brain.    Discussed also with Dr. Lyndel Safe with gastroenterology who will consult on this patient later today or tomorrow.  GI bleed in the setting of anticoagulation.  History suggestive of Mallory-Weiss however patient with history of liver cirrhosis and so could have esophageal varices.  He had an upper endoscopy in 2014 which was normal.  Continue with octreotide infusion.  Pepcid intravenously.  Recheck hemoglobin later today.  If he has recurrence of his GI bleed may consider PPI infusion.  Patient with other numerous medical problems as outlined in the H&P including hepatocellular carcinoma with pulmonary mets.  Agree with palliative medicine input.  Patient noted to be DNR.  Discussed with nursing staff.  Will continue to follow.  Bonnielee Haff 11/11/2017

## 2017-11-11 NOTE — ED Triage Notes (Signed)
**Note De-Identified Krasinski Obfuscation** Pt c/o vomiting bright red blood tonight approximately 6-7 times, has hx of same occurring last week,

## 2017-11-11 NOTE — H&P (Signed)
**Note De-Identified [place] Obfuscation** History and Physical    Marc Wilson BMW:[id number] DOB: July 05, 1955 DOA: 11/11/2017  Referring MD/NP/PA: Dr. Christy Gentles. PCP: Janora Norlander, DO  Patient coming from: Home  Chief Complaint: Fall, altered mental status, vomiting blood.  HPI: Marc Wilson is a 62 y.o. 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  Family reported that for the last 2 days he has been sick on his stomach and has had multiple episodes of vomiting with the last couple of them  demonstrating bright red blood with stomach contents.  They also expressed a mechanical Fall approximately 2 days prior to admission, where he hit his head but no medical attention was pursued at that time.  Patient has been noticed with decreased appetite and not maintaining good hydration. There has not been any fever, chills, chest pain, worsening breathing, dysuria, hematuria, melena, hematochezia or any other complaints.  In the ED images studies has demonstrated positive subdural hematoma, hemoglobin of 10.2, BUN of 33, creatinine 1.17, normal platelets.  Neurosurgery was consulted due to subdural hematoma and they recommend the patient to be transferred to [facility] for further evaluation and treatment.  They also recommended Keppra to be started for seizure prophylaxis and complete acute reverse of anticoagulation. [facility] contacted to admit patient for further evaluation and management.  Past Medical/Surgical History: Past Medical History:  Diagnosis Date  . Acute renal failure ([place]) 02/07/2013  . Alcoholism ([place])   . Ascites   . Cirrhosis ([place])   . Hepatitis C  02/10/2013  . Liver cancer ([place])   . Lung cancer ([place])   . Lytic lesion of bone on x-ray 02/07/2013  . Pericardial effusion 02/07/2013  . UTI (urinary tract infection) 02/07/2013    Past Surgical History:  Procedure Laterality Date  . ESOPHAGOGASTRODUODENOSCOPY N/A 02/12/2013   Procedure: ESOPHAGOGASTRODUODENOSCOPY (EGD);  Surgeon: Gatha Mayer, MD;  Location[facility] ENDOSCOPY;  Service: Endoscopy;  Laterality: N/A;  . IR PARACENTESIS  10/30/2017    Social History:  reports that he has been smoking cigarettes. He has a 10.00 pack-year smoking history. He has never used smokeless tobacco. He reports that he drank about 12.0 standard drinks of alcohol per week. He reports that he does not use drugs.  Allergies: No Known Allergies  Family History:  Family History  Problem Relation Age of Onset  . Diabetes type II Mother   . Cirrhosis Mother   . Diabetes type II Father   . Alcoholism Father   . Cirrhosis Father   . Cancer Sister        CLL  . Cancer Sister        gastric carcinoid  . Diabetes type II Brother   . Cancer Brother   . Pulmonary embolism Daughter   . Bipolar disorder Daughter   . Depression Daughter   . Colon cancer Neg Hx   . Throat cancer Neg Hx   . Heart disease Neg Hx   . Kidney disease Neg Hx     Prior to Admission medications   Medication Sig Start Date End Date Taking? Authorizing Provider  doxycycline (VIBRAMYCIN) 100 MG capsule Take 1 capsule (100 mg total) by mouth 2 (two) times daily for 7 days. 11/07/17 11/14/17 **Note De-Identified [place] Obfuscation** Johnson, Clanford L, MD  furosemide (LASIX) 20 MG tablet Take 2 tablets (40 mg total) by mouth daily. 10/17/17   Lockamy, Randi L, NP-C  potassium chloride SA (K-DUR,KLOR-CON) 20 MEQ tablet Take 1 tablet (20 mEq total) by mouth daily. 10/17/17   Lockamy, Randi L, NP-C  spironolactone (ALDACTONE) 25 MG tablet Take 1 tablet (25 mg total) by mouth daily. 10/17/17   Lockamy, Randi L, NP-C  umeclidinium-vilanterol (ANORO ELLIPTA) 62.5-25 MCG/INH AEPB  Inhale 1 puff into the lungs daily. **Every day inhaler** 12/15/16   Ronnie Doss M, DO  warfarin (COUMADIN) 5 MG tablet Take 1 tablet (5 mg total) by mouth daily. 10/14/17 14-Nov-2017  Noemi Chapel, MD    Review of Systems:  Unable to fully assess review of system given ongoing patient's confusion.  After discussing with family members he had experienced positive hematemesis, anorexia and altered mental status changes. No diarrhea, dysuria, melena, hematochezia, chest pain, shortness of breath, fever, chills or any other complaints.   Physical Exam: Vitals:   11/11/17 0613 11/11/17 0630 11/11/17 0730 11/11/17 0800  BP: 121/81 123/78 119/86 92/78  Pulse: 96 100 98   Resp: 16 12 15  (!) 24  Temp:      TempSrc:      SpO2: 99% 99% 97%   Weight:      Height:        Constitutional: Confused, in mild distress, afebrile and denying chest pain.  No further episode of hematemesis while in ED.   Eyes: PERRL, lids and conjunctivae normal; no nystagmus. ENMT: Mucous membranes are dry. Posterior pharynx clear of any exudate or lesions. Poor dentition.  Neck: normal, supple, no masses, no thyromegaly, no JVD  Respiratory: Positive for scattered rhonchi, no wheezing on exam.  No using accessory muscles.   Cardiovascular: Sinus tachycardia, no rubs, no gallops, no murmurs.  No lower extremity swelling appreciated.  Abdomen: Vague diffuse tenderness on deep palpation, positive way fluids consistent with ascites on exam.  Abdomen is distended, positive bowel sounds.   Musculoskeletal: no clubbing / cyanosis. No joint deformity upper and lower extremities. Good ROM, lower extremity with open residual erythematous changes from recent cellulitic process.  Skin: no petechiae, no open wounds.  Neurologic: Unable to fully assess secondary to ongoing confusion.  Cranial nerve appears to be grossly intact, patient able to move 4 limbs spontaneously.  No focal deficit appreciated.   Psychiatric: Anxious, confused  and with some difficulties following commands.   Labs on Admission: I have personally reviewed the following labs and imaging studies  CBC: Recent Labs  Lab 11/05/17 0556 11/06/17 0555 11/07/17 0613 11/11/17 0432  WBC 11.1* 12.7* 13.7* 18.9*  NEUTROABS  --   --   --  17.2*  HGB 11.0* 11.2* 11.2* 10.2*  HCT 33.8* 34.5* 34.5* 32.0*  MCV 90.1 90.1 90.1 90.7  PLT 204 166 164 102   Basic Metabolic Panel: Recent Labs  Lab 11/05/17 0556 11/11/17 0432  NA 131* 128*  K 3.9 5.0  CL 99 97*  CO2 22 20*  GLUCOSE 93 105*  BUN 22* 33*  CREATININE 1.04 1.17  CALCIUM 7.9* 8.4*   GFR: Estimated Creatinine Clearance: 63.6 mL/min (by C-G formula based on SCr of 1.17 mg/dL).   Liver Function Tests: Recent Labs  Lab 11/11/17 0432  AST 209*  ALT 32  ALKPHOS 221*  BILITOT 2.9*  PROT 7.0  ALBUMIN 2.4*    Recent Labs  Lab 11/11/17 0433  AMMONIA 70*   Coagulation Profile: Recent **Note De-Identified [place] Obfuscation** Labs  Lab 11/05/17 0556 11/06/17 0555 11/07/17 0613 11/11/17 0432  INR 2.10 2.50 2.64 2.54   Urine analysis:    Component Value Date/Time   COLORURINE AMBER (A) 11/11/2017 0841   APPEARANCEUR CLEAR 11/11/2017 0841   LABSPEC 1.015 11/11/2017 0841   PHURINE 5.0 11/11/2017 0841   GLUCOSEU NEGATIVE 11/11/2017 0841   HGBUR NEGATIVE 11/11/2017 0841   BILIRUBINUR NEGATIVE 11/11/2017 0841   KETONESUR NEGATIVE 11/11/2017 0841   PROTEINUR NEGATIVE 11/11/2017 0841   UROBILINOGEN 4.0 (H) 02/07/2013 1730   NITRITE NEGATIVE 11/11/2017 0841   LEUKOCYTESUR NEGATIVE 11/11/2017 0841    Recent Results (from the past 240 hour(s))  Culture, blood (routine x 2)     Status: None   Collection Time: 11/02/17  5:44 PM  Result Value Ref Range Status   Specimen Description BLOOD LEFT ARM  Final   Special Requests   Final    BOTTLES DRAWN AEROBIC AND ANAEROBIC Blood Culture adequate volume   Culture   Final    NO GROWTH 5 DAYS Performed at [facility], [address] [place], [place] [address]    Report  Status 11/07/2017 FINAL  Final  Culture, blood (routine x 2)     Status: None   Collection Time: 11/02/17  5:51 PM  Result Value Ref Range Status   Specimen Description BLOOD RIGHT HAND  Final   Special Requests   Final    BOTTLES DRAWN AEROBIC ONLY Blood Culture results may not be optimal due to an inadequate volume of blood received in culture bottles   Culture   Final    NO GROWTH 5 DAYS Performed at [facility], [address], [place], [place] [address]    Report Status 11/07/2017 FINAL  Final     Radiological Exams on Admission: Ct Angio Head W Or Wo Contrast  Result Date: 11/11/2017 CLINICAL DATA:  Cerebral hemorrhage suspected. Anticoagulated/coagulopathic patient with metastatic hepatoma. EXAM: CT ANGIOGRAPHY HEAD TECHNIQUE: Multidetector CT imaging of the head was performed using the standard protocol during bolus administration of intravenous contrast. Multiplanar CT image reconstructions and MIPs were obtained to evaluate the vascular anatomy. CONTRAST:  144mL ISOVUE-370 IOPAMIDOL (ISOVUE-370) INJECTION 76% COMPARISON:  Head CT from earlier today FINDINGS: CTA HEAD Anterior circulation: Vessels are smooth and widely patent. No atheromatous changes or aneurysm. Negative for stenosis. No evidence of vascular malformation. Posterior circulation: Vessels are smooth and widely patent. No aneurysm or atheromatous changes. Negative for stenosis. Venous sinuses: Patent Anatomic variants: None significant Delayed phase: Known subdural hemorrhage along the posterior left cerebral convexity low-density areas likely reflecting serum. Maximal thickness in the left occipital region is 18 mm. Thin volume blood extends along the left tentorium. Trace hygroma suspected along the right frontal parietal convexity. IMPRESSION: 1. Subdural hematoma along the left occipital parietal convexity without progression from recent scan. 2. No evidence of underlying vascular lesion. No visible enhancing mass.  Electronically Signed   By: Monte Fantasia M.D.   On: 11/11/2017 07:31   Ct Head Wo Contrast  Result Date: 11/11/2017 CLINICAL DATA:  62 y/o M; history of metastatic liver cancer. Hemoptysis. EXAM: CT HEAD WITHOUT CONTRAST TECHNIQUE: Contiguous axial images were obtained from the base of the skull through the vertex without intravenous contrast. COMPARISON:  None. FINDINGS: Brain: Subdural hematoma over the left parietal convexity, falx, and tentorium. Hemorrhage has a lobulated contour with several hyperdense blood/fluid levels suggesting an underlying mass or vascular malformation. The hematoma measures up to 2.4 cm in thickness. Mass effect from **Note De-Identified [place] Obfuscation** the subdural hematoma effaces the occipital horn of left lateral ventricle and results in minimal 2 mm left-to-right midline shift. No brain parenchymal hemorrhage, stroke, or downward herniation. Vascular: Mild calcific atherosclerosis of carotid siphons. Skull: Normal. Negative for fracture or focal lesion. Sinuses/Orbits: Right maxillary sinus and bilateral sphenoid sinus mucosal thickening. Chronic inflammatory changes of the walls of bilateral maxillary sinuses and the sphenoid sinus. Normal aeration of mastoid air cells. Other: None. IMPRESSION: 1. Subdural hematoma over the left parietal convexity, falx, and tentorium. Hemorrhage has a lobulated contour with blood fluid levels, possibly representing an underlying mass or vascular malformation. Consider CT angiogram of the head to further characterize. 2. Mild associated mass effect with effacement of occipital horn of left lateral ventricle into 2 mm left-to-right midline shift. 3. No brain parenchymal hemorrhage, stroke, or herniation. These results were called by telephone at the time of interpretation on 11/11/2017 at 6:22 am to Dr. Ripley Fraise , who verbally acknowledged these results. Electronically Signed   By: Kristine Garbe M.D.   On: 11/11/2017 06:25   Ct Abdomen Pelvis W  Contrast  Result Date: 11/11/2017 CLINICAL DATA:  Nausea/vomiting/diarrhea, weakness, altered mental status. Recently diagnosed hepatocellular carcinoma with multifocal lung metastases EXAM: CT ABDOMEN AND PELVIS WITH CONTRAST TECHNIQUE: Multidetector CT imaging of the abdomen and pelvis was performed using the standard protocol following bolus administration of intravenous contrast. CONTRAST:  155mL ISOVUE-370 IOPAMIDOL (ISOVUE-370) INJECTION 76% COMPARISON:  MRI abdomen dated 10/30/2017. CT chest dated 10/14/2017. FINDINGS: Lower chest: Innumerable pulmonary metastases throughout the lungs. Two dominant right lower lobe nodules measure 2.2 and 2.6 cm (series 14/image 16). Hepatobiliary: Multifocal/aggregate right hepatic mass measuring at least 16.9 cm in the right hepatic lobe (series 12/image 30), corresponding to suspected hepatocellular carcinoma, better evaluated on prior MRI. Underlying cirrhosis. Gallbladder is unremarkable. No intrahepatic or extrahepatic ductal dilatation. Pancreas: Within normal limits. Spleen: Within normal limits. Adrenals/Urinary Tract: Adrenal glands are within normal limits. 9 mm cyst in the anterior left lower kidney. Right kidney is within normal limits. No hydronephrosis. Bladder is within normal limits. Stomach/Bowel: Stomach is within normal limits. No evidence of bowel obstruction. Normal appendix (series 12/image 77). Vascular/Lymphatic: No evidence of abdominal aortic aneurysm. Atherosclerotic calcifications the abdominal aorta and branch vessels. Right portal vein thrombosis (series 12/image 37), likely secondary to tumor involvement. Gastroesophageal varices.  Recanalized periumbilical vein. No suspicious abdominopelvic lymphadenopathy. Reproductive: Prostate is unremarkable. Other: Large volume abdominopelvic ascites. Musculoskeletal: Degenerative changes of the lumbar spine. IMPRESSION: Large right hepatic mass, corresponding to suspected hepatocellular carcinoma,  better evaluated on prior MRI. Right portal vein thrombosis of the right portal vein, likely secondary to tumor involvement. Cirrhosis. Gastroesophageal varices. Large volume abdominopelvic ascites. Innumerable pulmonary metastases, incompletely visualized. Electronically Signed   By: Julian Hy M.D.   On: 11/11/2017 07:43   Dg Chest Port 1 View  Result Date: 11/11/2017 CLINICAL DATA:  62 y/o  M; vomiting bright red blood. EXAM: PORTABLE CHEST 1 VIEW COMPARISON:  11/06/2017 chest radiograph FINDINGS: Numerous pulmonary nodules are grossly stable. No new consolidation, effusion, or pneumothorax. Stable normal cardiac silhouette. Bones are unremarkable. IMPRESSION: Stable numerous pulmonary nodules compatible with metastatic disease. No new consolidation identified. Electronically Signed   By: Kristine Garbe M.D.   On: 11/11/2017 05:21    EKG: Independently reviewed.  Prolonged QT appreciated, sinus tachycardia.  Assessment/Plan 1-subdural hematoma ([place]): After experiencing mechanical fall and hitting his head. -Patient chronically on anticoagulation (using Coumadin, INR therapeutic). -Case has been discussed with neurosurgery who has **Note De-Identified [place] Obfuscation** recommended full reversal of anticoagulation therapy and transferred to [facility] for further evaluation and management. -A CT angios has been done in order to help differentiating the component of malformation or masses around subdural hematoma; which demonstrated to be just the blood collection and no masses. -Patient receive Kcentra and also IV vitamin K -Pharmacy actively following INR for reversal. -Patient will be admitted to stepdown bed. -keppra BID recommended by neurosurgery for seizure prophylaxis.   2-acute encephalopathy: In the setting of elevated ammonia with an underlying history of cirrhosis. -Will be cautious with the use of sedatives medication and narcotics as much as possible. -Patient started on lactulose. -He is no  longer drinking alcohol. -Follow mentation.  3-upper GI.  Patient with history of cirrhosis, portal vein thrombosis and esophageal varices. -Family reported multiple episodes of vomiting for the last 2 days with the last couple of 10 having some bright red blood tinge. -Bleeding most likely associated with esophageal varices given his underlying history versus Mallory-Weiss. -Patient was chronically on anticoagulation.  His Coumadin will be reversed. -Vital signs overall stable and hemoglobin 10.2 -Will secured to large IV bore type and screen. -Patient will be started on famotidine every 12 hours and octreotide. -GI service consult to be done at [facility].  4-Liver cirrhosis, alcoholic ([place]) -Once medically stable will resume Lasix and Aldactone -Patient blood pressure is a slightly soft currently. -patient NPO and appears intravascular dry. -also with concerns for acute bleeding; will hold diuretics for now.   5-Ascites -will benefit of paracentesis down the road.  6-Tobacco use disorder, severe, dependence -nicotine patch has been ordered -once mentation is improved, would benefit of formal cessation counseling.  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  9-Left lower leg Cellulitis -will complete antibiotic therapy. -no swelling or drainage on exam.  10-Portal vein thrombosis -associated with hepatic carcinoma  -chronically on coumadin -anticoagulation on hold due to subdural hematoma and GIB. -re-evaluate safety for future anticoagulation once stable.  11-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[facility]) -actively follow by oncology service -overall with poor prognosis -palliative care has been consulted -family in agreement with DNR/DNI  12-Severe  protein-calorie malnutrition ([place]) -currently NPO -once able to tolerate by mouth would benefit of feeding supplements.  13-Transaminitis -in the setting of alcoholic cirrhosis and hepatic adenocarcinoma -follow LFT's  14-prolonged QT -will monitor on telemetry -follow electrolytes -minimize meds that can further prolonged QT>  DVT prophylaxis: SCDs Code Status: DNR Family Communication: Daughter at bedside. Disposition Plan: Patient will be admitted to [facility] for further evaluation and management.  Disposition after discharge to be determined based on his clinical improvement. Consults called: Neurosurgery consulted by ED physician; palliative care consult also requested. Admission status: inpatient, stepdown, LOS > 2 midnights.    Time Spent: 70 minutes  Barton Dubois MD [facility] Pager [phone]  If 7PM-7AM, please contact night-coverage [email or web address] Password [email or web address]  11/11/2017, 8:59 AM

## 2017-11-11 NOTE — ED Notes (Signed)
**Note De-identified [place] Obfuscation** Daughter at bedside.

## 2017-11-11 NOTE — ED Notes (Signed)
**Note De-Identified [place] Obfuscation** Pt is able to state his name, where he is at but unsure of the date,

## 2017-11-11 NOTE — ED Notes (Signed)
**Note De-Identified [place] Obfuscation** carelink in to transfer pt

## 2017-11-11 NOTE — ED Provider Notes (Signed)
**Note De-Identified Clingerman Obfuscation** [facility] EMERGENCY DEPARTMENT Provider Note   CSN: [id number] Arrival date & time: 11/11/17  0319      Pt seen with NP student, I performed history/physical/documentation  History   Chief Complaint Chief Complaint  Patient presents with  . Hematemesis   Level 5 caveat due to altered mental status HPI Marc Wilson is a 62 y.o. male.  The history is provided by the patient. The history is limited by the condition of the patient.  Emesis   This is a new problem. The problem occurs 5 to 10 times per day. The problem has been gradually worsening. The emesis has an appearance of stomach contents and bright red blood. There has been no fever. Associated symptoms include abdominal pain.  Patient presents for vomiting.  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  Reportedly patient had up to 6-7 episodes of bright bloody this.  Patient is unable to fully elaborate but he does report he does not feel well.  Patient was recently admitted for cellulitis.  Past Medical History:  Diagnosis Date  . Acute renal failure ([place]) 02/07/2013  . Alcoholism ([place])   . Ascites   . Cirrhosis ([place])   . Hepatitis C 02/10/2013  . Liver cancer ([place])   . Lung cancer ([place])   . Lytic lesion of bone on x-ray 02/07/2013  . Pericardial effusion 02/07/2013  . UTI (urinary tract infection) 02/07/2013    Patient Active Problem List   Diagnosis Date Noted  . Cirrhosis ([place])   . 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 Cellulitis 11/02/2017  . Portal vein thrombosis 11/02/2017  . Liver mass 10/17/2017  . Essential hypertension 08/21/2017  . Compression fracture of first lumbar vertebra ([place]) 11/29/2016  . 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 Tobacco use disorder, severe, dependence 11/24/2016  . Skin lesion of face  11/24/2016  . Chest skin lesion 11/24/2016  . Concern about skin cancer without diagnosis 11/24/2016  . Weight loss, non-intentional 11/24/2016  . Chronic cough 11/24/2016  . Alcohol use disorder, moderate, dependence ([place]) 11/24/2016  . 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 Hepatitis C virus infection 02/19/2013  . Ascites 02/10/2013  . Pericardial effusion 02/07/2013  . Lytic lesion of bone on x-ray 02/07/2013  . Liver cirrhosis, alcoholic ([place]) 83/15/1761    Past Surgical History:  Procedure Laterality Date  . ESOPHAGOGASTRODUODENOSCOPY N/A 02/12/2013   Procedure: ESOPHAGOGASTRODUODENOSCOPY (EGD);  Surgeon: Gatha Mayer, MD;  Location: [facility] ENDOSCOPY;  Service: Endoscopy;  Laterality: N/A;  . IR PARACENTESIS  10/30/2017        Home Medications    Prior to Admission medications   Medication Sig Start Date End Date Taking? Authorizing Provider  doxycycline (VIBRAMYCIN) 100 MG capsule Take 1 capsule (100 mg total) by mouth 2 (two) times daily for 7 days. 11/07/17 11/14/17  Johnson, Clanford L, MD  furosemide (LASIX) 20 MG tablet Take 2 tablets (40 mg total) by mouth daily. 10/17/17   Lockamy, Randi L, NP-C  potassium chloride SA (K-DUR,KLOR-CON) 20 MEQ tablet Take 1 tablet (20 mEq total) by mouth daily. 10/17/17   Lockamy, Randi L, NP-C  spironolactone (ALDACTONE) 25 MG tablet Take 1 tablet (25 mg total) by mouth daily. 10/17/17   Lockamy, Randi L, NP-C  umeclidinium-vilanterol (ANORO ELLIPTA) 62.5-25 MCG/INH AEPB Inhale 1 puff into the lungs daily. **Every day inhaler** 12/15/16   Lajuana Ripple, **Note De-Identified Drummond Obfuscation** Ashly M, DO  warfarin (COUMADIN) 5 MG tablet Take 1 tablet (5 mg total) by mouth daily. 10/14/17 2017-12-09  Noemi Chapel, MD    Family History Family History  Problem Relation Age of Onset  . Diabetes type II Mother   . Cirrhosis Mother   . Diabetes type II Father   . Alcoholism Father   . Cirrhosis Father   . Cancer Sister        CLL  . Cancer Sister        gastric  carcinoid  . Diabetes type II Brother   . Cancer Brother   . Pulmonary embolism Daughter   . Bipolar disorder Daughter   . Depression Daughter   . Colon cancer Neg Hx   . Throat cancer Neg Hx   . Heart disease Neg Hx   . Kidney disease Neg Hx     Social History Social History   Tobacco Use  . Smoking status: Current Every Day Smoker    Packs/day: 0.25    Years: 40.00    Pack years: 10.00    Types: Cigarettes  . Smokeless tobacco: Never Used  Substance Use Topics  . Alcohol use: Not Currently    Alcohol/week: 12.0 standard drinks    Types: 12 Cans of beer per week    Comment: 12 beers daily x 40 years- none in 4 or 5 weeks  . Drug use: No    Types: Marijuana    Comment: has d/c as of 01/2013     Allergies   Patient has no known allergies.   Review of Systems Review of Systems  Unable to perform ROS: Mental status change  Gastrointestinal: Positive for abdominal pain and vomiting.     Physical Exam Updated Vital Signs BP 111/76   Pulse 88   Temp 98.2 F (36.8 C) (Oral)   Resp 14   Ht 1.702 m (5\' 7" )   Wt 73 kg   SpO2 96%   BMI 25.21 kg/m   Physical Exam CONSTITUTIONAL: Cachectic, ill-appearing, appears older than stated age HEAD: Normocephalic/atraumatic EYES: EOMI/PERRL, icterus noted ENMT: Mucous membranes dry, poor dentition NECK: supple no meningeal signs SPINE/BACK:entire spine nontender CV: S1/S2 noted, no murmurs/rubs/gallops noted LUNGS: Bibasilar crackles ABDOMEN: soft, distended with mild diffuse tenderness GU:no cva tenderness, wearing diaper Rectal-black stool noted, no blood NEURO: Pt is awake/alert, patient is confused to current date moves all extremitiesx4.  No facial droop.   EXTREMITIES: pulses normal/equal, full ROM, no signs of trauma, cellulitis to left lower extremity, no crepitus or drainage SKIN: warm, color normal PSYCH: Unable to assess   ED Treatments / Results  Labs (all labs ordered are listed, but only abnormal  results are displayed) Labs Reviewed  CBC WITH DIFFERENTIAL/PLATELET - Abnormal; Notable for the following components:      Result Value   WBC 18.9 (*)    RBC 3.53 (*)    Hemoglobin 10.2 (*)    HCT 32.0 (*)    RDW 20.2 (*)    Neutro Abs 17.2 (*)    All other components within normal limits  COMPREHENSIVE METABOLIC PANEL - Abnormal; Notable for the following components:   Sodium 128 (*)    Chloride 97 (*)    CO2 20 (*)    Glucose, Bld 105 (*)    BUN 33 (*)    Calcium 8.4 (*)    Albumin 2.4 (*)    AST 209 (*)    Alkaline Phosphatase 221 (*)    Total Bilirubin **Note De-Identified Krausz Obfuscation** 2.9 (*)    All other components within normal limits  PROTIME-INR - Abnormal; Notable for the following components:   Prothrombin Time 27.1 (*)    All other components within normal limits  AMMONIA - Abnormal; Notable for the following components:   Ammonia 70 (*)    All other components within normal limits  POC OCCULT BLOOD, ED - Abnormal; Notable for the following components:   Fecal Occult Bld POSITIVE (*)    All other components within normal limits  ETHANOL  TYPE AND SCREEN    EKG EKG Interpretation  Date/Time:  Sunday November 11 2017 04:20:30 EDT Ventricular Rate:  102 PR Interval:    QRS Duration: 104 QT Interval:  348 QTC Calculation: 182 R Axis:   48 Text Interpretation:  Sinus tachycardia Borderline abnrm T, anterolateral leads Interpretation limited secondary to artifact Confirmed by Ripley Fraise [id number]) on 11/11/2017 4:33:41 AM   Radiology Ct Head Wo Contrast  Result Date: 11/11/2017 CLINICAL DATA:  62 y/o M; history of metastatic liver cancer. Hemoptysis. EXAM: CT HEAD WITHOUT CONTRAST TECHNIQUE: Contiguous axial images were obtained from the base of the skull through the vertex without intravenous contrast. COMPARISON:  None. FINDINGS: Brain: Subdural hematoma over the left parietal convexity, falx, and tentorium. Hemorrhage has a lobulated contour with several hyperdense blood/fluid levels  suggesting an underlying mass or vascular malformation. The hematoma measures up to 2.4 cm in thickness. Mass effect from the subdural hematoma effaces the occipital horn of left lateral ventricle and results in minimal 2 mm left-to-right midline shift. No brain parenchymal hemorrhage, stroke, or downward herniation. Vascular: Mild calcific atherosclerosis of carotid siphons. Skull: Normal. Negative for fracture or focal lesion. Sinuses/Orbits: Right maxillary sinus and bilateral sphenoid sinus mucosal thickening. Chronic inflammatory changes of the walls of bilateral maxillary sinuses and the sphenoid sinus. Normal aeration of mastoid air cells. Other: None. IMPRESSION: 1. Subdural hematoma over the left parietal convexity, falx, and tentorium. Hemorrhage has a lobulated contour with blood fluid levels, possibly representing an underlying mass or vascular malformation. Consider CT angiogram of the head to further characterize. 2. Mild associated mass effect with effacement of occipital horn of left lateral ventricle into 2 mm left-to-right midline shift. 3. No brain parenchymal hemorrhage, stroke, or herniation. These results were called by telephone at the time of interpretation on 11/11/2017 at 6:22 am to Dr. Ripley Fraise , who verbally acknowledged these results. Electronically Signed   By: Kristine Garbe M.D.   On: 11/11/2017 06:25   Dg Chest Port 1 View  Result Date: 11/11/2017 CLINICAL DATA:  62 y/o  M; vomiting bright red blood. EXAM: PORTABLE CHEST 1 VIEW COMPARISON:  11/06/2017 chest radiograph FINDINGS: Numerous pulmonary nodules are grossly stable. No new consolidation, effusion, or pneumothorax. Stable normal cardiac silhouette. Bones are unremarkable. IMPRESSION: Stable numerous pulmonary nodules compatible with metastatic disease. No new consolidation identified. Electronically Signed   By: Kristine Garbe M.D.   On: 11/11/2017 05:21    Procedures Procedures  CRITICAL  CARE Performed by: Sharyon Cable Total critical care time: 45 minutes Critical care time was exclusive of separately billable procedures and treating other patients. Critical care was necessary to treat or prevent imminent or life-threatening deterioration. Critical care was time spent personally by me on the following activities: development of treatment plan with patient and/or surrogate as well as nursing, discussions with consultants, evaluation of patient's response to treatment, examination of patient, obtaining history from patient or surrogate, ordering and performing treatments and interventions, ordering **Note De-Identified Raftery Obfuscation** and review of laboratory studies, ordering and review of radiographic studies, pulse oximetry and re-evaluation of patient's condition.   Medications Ordered in ED Medications  prothrombin complex conc human (KCENTRA) IVPB 2,000 Units (2,000 Units Intravenous New Bag/Given 11/11/17 0720)  phytonadione (VITAMIN K) 10 mg in dextrose 5 % 50 mL IVPB (10 mg Intravenous New Bag/Given 11/11/17 0730)  levETIRAcetam (KEPPRA) IVPB 500 mg/100 mL premix (has no administration in time range)  ondansetron (ZOFRAN) injection 4 mg (4 mg Intravenous Given 11/11/17 0345)  fentaNYL (SUBLIMAZE) injection 50 mcg (50 mcg Intravenous Given 11/11/17 0550)  iopamidol (ISOVUE-370) 76 % injection 100 mL (100 mLs Intravenous Contrast Given 11/11/17 0659)     Initial Impression / Assessment and Plan / ED Course  I have reviewed the triage vital signs and the nursing notes.  Pertinent labs & imaging results that were available during my care of the patient were reviewed by me and considered in my medical decision making (see chart for details).     4:40 AM Patient appears ill.  There is reports of hematemesis, and he does have evidence of melena.  He has high risk for GI bleed due to cirrhosis.  Previous EGD did reveal no acute findings in 2014.  He also appears to have mental status changes, but is afebrile  at this time.  Imaging labs are pending at this time Family is not available at this time 4:56 AM Daughter has arrived.  She does report the cellulitis in his left leg is improved.  He had been walking, but now is too confused.  She reports he had been feeling sick all day, and then had multiple episodes of hematemesis tonight.  She reports he is rapidly declining.  She reports he did fall about 2 days ago and did hit his head. We will check a CT head.  Patient will need to be admitted. Other considerations include SBP, hepatic encephalopathy. 6:27 AM CT scan reviewed with radiologist which revealed subdural hematoma, mass not excluded.  Radiologist recommends CT angios head. Patient's INR is over 2. It is reported that he did fall and hit his back.  I will also obtain a CT abdomen pelvis. Due to intracranial hemorrhage and presence of elevated INR, Eppie Gibson has been ordered.  Neurosurgery has been consulted. 6:55 AM D/w PA Vinny with neurosurgery.  We discussed case in its entirety.  Recommended plan includes reversal of Coumadin.  Keppra for seizure prophylaxis.  Admit to medicine due to complex medical conditions.  They will consult once he arrives at [facility] 7:32 AM Patient and family updated on findings and plan. Plan to admit to [facility] for reversal of coagulopathy and monitoring mental status.  He will require neurosurgical consult upon arrival.  Patient will also likely benefit from work-up for potential stable GI bleed.  We will also likely need paracentesis as well as SBP is still possibility D/w Dr. Dyann Kief for admission Final Clinical Impressions(s) / ED Diagnoses   Final diagnoses:  Subdural hematoma ([place])  Hyponatremia  Hematemesis with nausea  Dehydration  Cirrhosis of liver with ascites, unspecified hepatic cirrhosis type [facility])  Coagulopathy [facility])    ED Discharge Orders    None       Ripley Fraise, MD 11/11/17 [phone]

## 2017-11-11 NOTE — Progress Notes (Signed)
**Note De-identified Pion Obfuscation** Pt arrived to unit

## 2017-11-11 NOTE — ED Notes (Signed)
**Note De-identified Savo Obfuscation** ED Provider at bedside. 

## 2017-11-11 NOTE — ED Provider Notes (Signed)
**Note De-Identified Plouff Obfuscation** Pt becoming more agitated ekg performed due to sinus tach   EKG Interpretation  Date/Time:  Sunday November 11 2017 08:30:09 EDT Ventricular Rate:  131 PR Interval:    QRS Duration: 98 QT Interval:  420 QTC Calculation: 621 R Axis:   2 Text Interpretation:  Sinus tachycardia Abnormal R-wave progression, early transition Minimal ST depression, anterolateral leads Borderline ST elevation, anterior leads Prolonged QT interval Confirmed by Ripley Fraise [phone]) on 11/11/2017 8:34:31 AM      He is awake/alert, tachycardic but hypertensive IV fentanyl ordered Nursing has placed foley He is awaiting admit Family updated on plan    Ripley Fraise, MD 11/11/17 [phone]

## 2017-11-12 ENCOUNTER — Ambulatory Visit: Payer: Self-pay | Admitting: Family

## 2017-11-12 DIAGNOSIS — Z7189 Other specified counseling: Secondary | ICD-10-CM

## 2017-11-12 DIAGNOSIS — Z515 Encounter for palliative care: Secondary | ICD-10-CM

## 2017-11-12 DIAGNOSIS — C7800 Secondary malignant neoplasm of unspecified lung: Secondary | ICD-10-CM

## 2017-11-12 DIAGNOSIS — C220 Liver cell carcinoma: Secondary | ICD-10-CM

## 2017-11-12 DIAGNOSIS — R188 Other ascites: Secondary | ICD-10-CM

## 2017-11-12 DIAGNOSIS — K7460 Unspecified cirrhosis of liver: Secondary | ICD-10-CM

## 2017-11-12 DIAGNOSIS — J439 Emphysema, unspecified: Secondary | ICD-10-CM

## 2017-11-12 LAB — COMPREHENSIVE METABOLIC PANEL
ALT: 46 U/L — AB (ref 0–44)
AST: 289 U/L — ABNORMAL HIGH (ref 15–41)
Albumin: 2.2 g/dL — ABNORMAL LOW (ref 3.5–5.0)
Alkaline Phosphatase: 220 U/L — ABNORMAL HIGH (ref 38–126)
Anion gap: 13 (ref 5–15)
BUN: 38 mg/dL — ABNORMAL HIGH (ref 6–20)
CHLORIDE: 99 mmol/L (ref 98–111)
CO2: 20 mmol/L — ABNORMAL LOW (ref 22–32)
CREATININE: 1.45 mg/dL — AB (ref 0.61–1.24)
Calcium: 8.3 mg/dL — ABNORMAL LOW (ref 8.9–10.3)
GFR calc non Af Amer: 51 mL/min — ABNORMAL LOW (ref >60)
GFR, EST AFRICAN AMERICAN: 59 mL/min — AB (ref >60)
Glucose, Bld: 74 mg/dL (ref 70–99)
Potassium: 5.9 mmol/L — ABNORMAL HIGH (ref 3.5–5.1)
Sodium: 132 mmol/L — ABNORMAL LOW (ref 135–145)
Total Bilirubin: 3.1 mg/dL — ABNORMAL HIGH (ref 0.3–1.2)
Total Protein: 6 g/dL — ABNORMAL LOW (ref 6.5–8.1)

## 2017-11-12 LAB — CBC
HCT: 33.9 % — ABNORMAL LOW (ref 39.0–52.0)
Hemoglobin: 10.6 g/dL — ABNORMAL LOW (ref 13.0–17.0)
MCH: 29 pg (ref 26.0–34.0)
MCHC: 31.3 g/dL (ref 30.0–36.0)
MCV: 92.9 fL (ref 78.0–100.0)
PLATELETS: 236 10*3/uL (ref 150–400)
RBC: 3.65 MIL/uL — AB (ref 4.22–5.81)
RDW: 21.8 % — AB (ref 11.5–15.5)
WBC: 18.6 10*3/uL — ABNORMAL HIGH (ref 4.0–10.5)

## 2017-11-12 LAB — PROTIME-INR
INR: 1.26
Prothrombin Time: 15.7 seconds — ABNORMAL HIGH (ref 11.4–15.2)

## 2017-11-12 LAB — URINE CULTURE: CULTURE: NO GROWTH

## 2017-11-12 MED ORDER — POLYVINYL ALCOHOL 1.4 % OP SOLN: 1.0000 [drp] | Freq: Four times a day (QID) | OPHTHALMIC | 0 refills | Status: AC | PRN

## 2017-11-12 MED ORDER — GLYCOPYRROLATE 0.2 MG/ML IJ SOLN: 0.2000 mg | INTRAMUSCULAR | Status: DC | PRN

## 2017-11-12 MED ORDER — ACETAMINOPHEN 325 MG PO TABS: 650.0000 mg | ORAL_TABLET | Freq: Four times a day (QID) | ORAL | Status: DC | PRN

## 2017-11-12 MED ORDER — SODIUM CHLORIDE 0.9% FLUSH
3.0000 mL | INTRAVENOUS | Status: DC | PRN
Start: 2017-11-12 — End: 2017-11-12

## 2017-11-12 MED ORDER — LORAZEPAM 2 MG/ML IJ SOLN: 0.5000 mg | Freq: Four times a day (QID) | INTRAMUSCULAR | 0 refills | Status: AC | PRN

## 2017-11-12 MED ORDER — HALOPERIDOL LACTATE 2 MG/ML PO CONC: 0.5000 mg | ORAL | Status: DC | PRN

## 2017-11-12 MED ORDER — ACETAMINOPHEN 650 MG RE SUPP: 650.0000 mg | Freq: Four times a day (QID) | RECTAL | 0 refills | Status: AC | PRN

## 2017-11-12 MED ORDER — GLYCOPYRROLATE 0.2 MG/ML IJ SOLN: 0.2000 mg | INTRAMUSCULAR | Status: AC | PRN

## 2017-11-12 MED ORDER — MORPHINE SULFATE (PF) 2 MG/ML IV SOLN: 1.0000 mg | INTRAVENOUS | 0 refills | Status: AC | PRN

## 2017-11-12 MED ORDER — UMECLIDINIUM-VILANTEROL 62.5-25 MCG/INH IN AEPB
1.0000 | INHALATION_SPRAY | Freq: Every day | RESPIRATORY_TRACT | Status: DC
  Filled 2017-11-12: qty 14

## 2017-11-12 MED ORDER — POLYVINYL ALCOHOL 1.4 % OP SOLN
1.0000 [drp] | Freq: Four times a day (QID) | OPHTHALMIC | Status: DC | PRN
  Filled 2017-11-12: qty 15

## 2017-11-12 MED ORDER — SODIUM CHLORIDE 0.9% FLUSH: 3.0000 mL | Freq: Two times a day (BID) | INTRAVENOUS | Status: DC

## 2017-11-12 MED ORDER — ONDANSETRON HCL 4 MG/2ML IJ SOLN: 4.0000 mg | Freq: Four times a day (QID) | INTRAMUSCULAR | 0 refills | Status: AC | PRN

## 2017-11-12 MED ORDER — ONDANSETRON 4 MG PO TBDP: 4.0000 mg | ORAL_TABLET | Freq: Four times a day (QID) | ORAL | Status: DC | PRN

## 2017-11-12 MED ORDER — SODIUM CHLORIDE 0.9 % IV SOLN
250.0000 mL | INTRAVENOUS | Status: DC | PRN
Start: 2017-11-12 — End: 2017-11-12

## 2017-11-12 MED ORDER — ONDANSETRON HCL 4 MG/2ML IJ SOLN: 4.0000 mg | Freq: Four times a day (QID) | INTRAMUSCULAR | Status: DC | PRN

## 2017-11-12 MED ORDER — HALOPERIDOL 0.5 MG PO TABS: 0.5000 mg | ORAL_TABLET | ORAL | Status: DC | PRN

## 2017-11-12 MED ORDER — GLYCOPYRROLATE 1 MG PO TABS: 1.0000 mg | ORAL_TABLET | ORAL | Status: DC | PRN

## 2017-11-12 MED ORDER — MORPHINE SULFATE (PF) 2 MG/ML IV SOLN
1.0000 mg | INTRAVENOUS | Status: DC | PRN
  Administered 2017-11-12: 1 mg via INTRAVENOUS
  Filled 2017-11-12: qty 1

## 2017-11-12 MED ORDER — BIOTENE DRY MOUTH MT LIQD: 15.0000 mL | OROMUCOSAL | Status: DC | PRN

## 2017-11-12 MED ORDER — NICOTINE 21 MG/24HR TD PT24: 21.0000 mg | MEDICATED_PATCH | Freq: Every day | TRANSDERMAL | 0 refills | Status: AC

## 2017-11-12 MED ORDER — HALOPERIDOL LACTATE 5 MG/ML IJ SOLN: 0.5000 mg | INTRAMUSCULAR | Status: DC | PRN

## 2017-11-12 MED ORDER — ACETAMINOPHEN 650 MG RE SUPP: 650.0000 mg | Freq: Four times a day (QID) | RECTAL | Status: DC | PRN

## 2017-11-12 MED ORDER — HALOPERIDOL LACTATE 5 MG/ML IJ SOLN: 0.5000 mg | INTRAMUSCULAR | Status: AC | PRN

## 2017-11-12 MED ORDER — ACETAMINOPHEN 325 MG PO TABS: 650.0000 mg | ORAL_TABLET | Freq: Four times a day (QID) | ORAL | Status: AC | PRN

## 2017-11-12 MED ORDER — ONDANSETRON 4 MG PO TBDP: 4.0000 mg | ORAL_TABLET | Freq: Four times a day (QID) | ORAL | 0 refills | Status: AC | PRN

## 2017-11-12 NOTE — Care Management Note (Signed)
**Note De-Identified Woodle Obfuscation** Case Management Note  Patient Details  Name: Ysidro Ramsay Haris MRN: [id number] Date of Birth: 1955/04/08  Subjective/Objective:                    Action/Plan: Pt discharging to [facility]. Bedside RN provided number for report:  [phone], room 4 PTAR arranged per request, DNR form and transport form on chart. Bedside RN updated. D/C summary faxed to [phone]   Expected Discharge Date:  11/12/17               Expected Discharge Plan:  [place]  In-House Referral:  Clinical Social Work  Discharge planning Services  CM Consult  Post Acute Care Choice:    Choice offered to:     DME Arranged:    DME Agency:     HH Arranged:    [place] Agency:     Status of Service:  Completed, signed off  If discussed at [employer], dates discussed:    Additional Comments:  Pollie Friar, RN 11/12/2017, 2:18 PM

## 2017-11-12 NOTE — Progress Notes (Signed)
**Note De-Identified Ramstad Obfuscation** Neurosurgery Progress Note  No issues overnight. Daughter at bedside with her husband. She is his only child. States she is medical POA and has notorized form. Will present to his attending. She states she thought they were coming here for comfort care. She does not wish to pursue any medical intervention given his condition. She states he would not have wanted to live like this.   EXAM:  BP 122/81 (BP Location: Left Arm)   Pulse 96   Temp 98.7 F (37.1 C) (Oral)   Resp 16   Ht 5\' 7"  (1.702 m)   Wt 74.1 kg   SpO2 96%   BMI 25.59 kg/m   Obtunded  PLAN Family transitioning over to comfort care. Discussed with nursing. Will hold on labs. Cancel CT head NSY will sign off. Please call for any concerns.

## 2017-11-12 NOTE — Progress Notes (Signed)
**Note De-Identified Hou Obfuscation** Per palliative order: CM called [facility] and faxed them the information they requested. CSW aware and following also.

## 2017-11-12 NOTE — Care Management Note (Signed)
**Note De-Identified Antolin Obfuscation** Case Management Note  Patient Details  Name: Marc Wilson MRN: [id number] Date of Birth: [birth year]  Subjective/Objective:   Pt admitted with subdural hematoma. He is from home alone.     PCP: Dr Lajuana Ripple Insurance: none              Action/Plan: Palliative consulted and the plans if comfort care. Daughter is requesting [facility] hosp[facility] in [place] aware.  Primary Contact is daughter [name]: [phone]    Expected Discharge Date:                  Expected Discharge Plan:  [place]  In-House Referral:  Clinical Social Work  Discharge planning Services     Post Acute Care Choice:    Choice offered to:     DME Arranged:    DME Agency:     HH Arranged:    [place] Agency:     Status of Service:  In process, will continue to follow  If discussed at Long Length of Stay Meetings, dates discussed:    Additional Comments:  Pollie Friar, RN 11/12/2017, 11:30 AM

## 2017-11-12 NOTE — Consult Note (Addendum)
**Note De-Identified [place] Obfuscation** Consultation Note Date: 11/12/2017   Patient Name: Marc Wilson  DOB: 09-Oct-1955  MRN: [id number]  Age / Sex: 62 y.o., male  PCP: Janora Norlander, DO Referring Physician: Cherene Altes, MD  Reason for Consultation: Establishing goals of care  HPI/Patient Profile: Marc Wilson is a 62 y.o. 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 Noted to have [place] due to fall prior to admission.   Clinical Assessment and Goals of Care: Patient is resting in bed, mitten restraint in place. He opens eye to speaker, but is not verbal. His daughter Marc Wilson is at bedside. She is only child and the patient is divorced. His parents are deceased. He has siblings.    She states prior to his previous admission and D/C 8/7, he was alert and oriented, and was fully functional. He was given his cancer diagnosis in July, and had decided at that time he did not want to pursue treatment.   She states she does not want her father to suffer and would like to focus on comfort care. Discussed transfer to a hospice home. She would like her father moved back to hospice home in [place] where they live. She is aware of risk of decompensation and transition upon transport.   Marc Wilson is next of kin and decision maker.    SUMMARY OF RECOMMENDATIONS    Transition to comfort care. Would like placement in [facility].  Palliative team checked with [facility] hospice, will not be able to continue Octreotide on D/C, will continue until D/C.    Code Status/Advance Care  Planning:  DNR    Symptom Management:   Morphine for pain.  Ativan for anxiety  Haldol for agitation.   Per nursing, no vomiting since admission. Stop Octreotide on D/C.   Palliative Prophylaxis:   Eye Care and Oral Care  Prognosis:   < 2 weeks  Discharge Planning: Hopeful for hospice home.      Primary Diagnoses: Present on Admission: . Tobacco use disorder, severe, dependence . Portal vein thrombosis . Liver cirrhosis, alcoholic ([place]) . 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([place]) . Essential hypertension . COPD (chronic obstructive pulmonary disease) with emphysema ([place]) . Ascites . Cellulitis . Subdural hematoma ([place]) . Severe protein-calorie malnutrition ([place]) . Transaminitis   I have reviewed the medical record, interviewed the patient and family, and examined the patient. The following aspects are pertinent.  Past Medical History:  Diagnosis Date  . Acute renal failure ([place]) 02/07/2013  . Alcoholism ([place])   . Ascites   . Cirrhosis ([place])   . Hepatitis C 02/10/2013  . Liver cancer ([place])   . Lung cancer ([employer])   . Lytic lesion of bone on x-ray 02/07/2013  . Pericardial effusion 02/07/2013  . **Note De-Identified [place] Obfuscation** UTI (urinary tract infection) 02/07/2013   Social History   Socioeconomic History  . Marital status: Legally Separated    Spouse name: Not on file  . Number of children: 1  . Years of education: Not on file  . Highest education level: 8th grade  Occupational History  . Not on file  Social Needs  . Financial resource strain: Very hard  . Food insecurity:    Worry: Never true    Inability: Often true  . Transportation needs:    Medical: No    Non-medical: No  Tobacco Use  . Smoking status: Current Every Day Smoker    Packs/day: 0.25    Years: 40.00    Pack years: 10.00    Types: Cigarettes  . Smokeless tobacco: Never Used  Substance and Sexual Activity  . Alcohol use: Not Currently    Alcohol/week: 12.0 standard drinks    Types: 12 Cans of  beer per week    Comment: 12 beers daily x 40 years- none in 4 or 5 weeks  . Drug use: No    Types: Marijuana    Comment: has d/c as of 01/2013  . Sexual activity: Not on file  Lifestyle  . Physical activity:    Days per week: 0 days    Minutes per session: 0 min  . Stress: Very much  Relationships  . Social connections:    Talks on phone: Once a week    Gets together: Twice a week    Attends religious service: Never    Active member of club or organization: No    Attends meetings of clubs or organizations: Never    Relationship status: Divorced  Other Topics Concern  . Not on file  Social History Narrative   Pt states that "i'm broke and I need all the help I can get". He also states that in 2 months he will likely be homeless.   Family History  Problem Relation Age of Onset  . Diabetes type II Mother   . Cirrhosis Mother   . Diabetes type II Father   . Alcoholism Father   . Cirrhosis Father   . Cancer Sister        CLL  . Cancer Sister        gastric carcinoid  . Diabetes type II Brother   . Cancer Brother   . Pulmonary embolism Daughter   . Bipolar disorder Daughter   . Depression Daughter   . Colon cancer Neg Hx   . Throat cancer Neg Hx   . Heart disease Neg Hx   . Kidney disease Neg Hx    Scheduled Meds: . nicotine  21 mg Transdermal Daily  . sodium chloride flush  3 mL Intravenous Q12H  . [START ON 01-Dec-2017] umeclidinium-vilanterol  1 puff Inhalation Daily   Continuous Infusions: . sodium chloride    . famotidine (PEPCID) IV 20 mg (11/11/17 2154)  . levETIRAcetam 500 mg (11/12/17 0913)  . octreotide  (SANDOSTATIN)    IV infusion 50 mcg/hr (11/12/17 0027)   PRN Meds:.sodium chloride, LORazepam, ondansetron **OR** ondansetron (ZOFRAN) IV, sodium chloride flush Medications Prior to Admission:  Prior to Admission medications   Medication Sig Start Date End Date Taking? Authorizing Provider  albuterol (PROVENTIL HFA;VENTOLIN HFA) 108 (90 Base) MCG/ACT  inhaler Inhale 1-2 puffs into the lungs every 6 (six) hours as needed for wheezing or shortness of breath.   Yes [provider]  Bisacodyl (LAXATIVE PO) Take 1 tablet by mouth daily **Note De-Identified [place] Obfuscation** as needed (Constipation).   Yes [provider]  doxycycline (VIBRAMYCIN) 100 MG capsule Take 1 capsule (100 mg total) by mouth 2 (two) times daily for 7 days. 11/07/17 11/14/17 Yes Johnson, Clanford L, MD  furosemide (LASIX) 20 MG tablet Take 2 tablets (40 mg total) by mouth daily. 10/17/17  Yes Lockamy, Randi L, NP-C  potassium chloride SA (K-DUR,KLOR-CON) 20 MEQ tablet Take 1 tablet (20 mEq total) by mouth daily. 10/17/17  Yes Lockamy, Randi L, NP-C  spironolactone (ALDACTONE) 25 MG tablet Take 1 tablet (25 mg total) by mouth daily. 10/17/17  Yes Lockamy, Randi L, NP-C  umeclidinium-vilanterol (ANORO ELLIPTA) 62.5-25 MCG/INH AEPB Inhale 1 puff into the lungs daily. **Every day inhaler** Patient taking differently: Inhale 1 puff into the lungs daily.  12/15/16  Yes Ronnie Doss M, DO  warfarin (COUMADIN) 5 MG tablet Take 1 tablet (5 mg total) by mouth daily. 10/14/17 11/30/2017 Yes Noemi Chapel, MD   No Known Allergies Review of Systems  Unable to perform ROS   Physical Exam  Constitutional:  Thin and frail.   Pulmonary/Chest: Effort normal.  Neurological:  Awakens to shift in bed. Nonverbal   Skin: Skin is warm and dry.    Vital Signs: BP 121/85 (BP Location: Left Arm)   Pulse 98   Temp 98.4 F (36.9 C)   Resp 17   Ht 5\' 7"  (1.702 m)   Wt 74.1 kg   SpO2 93%   BMI 25.59 kg/m  Pain Scale: 0-10   Pain Score: 0-No pain   SpO2: SpO2: 93 % O2 Device:SpO2: 93 % O2 Flow Rate: .O2 Flow Rate (L/min): 1.5 L/min  IO: Intake/output summary:   Intake/Output Summary (Last 24 hours) at 11/12/2017 1103 Last data filed at 11/12/2017 0431 Gross per 24 hour  Intake 422.56 ml  Output 850 ml  Net -427.44 ml    LBM: Last BM Date: (PTA) Baseline Weight: Weight: 73 kg Most recent weight:  Weight: 74.1 kg     Palliative Assessment/Data: 20%     Time In: 10:50 Time Out: 11:00 Time Total: 70 min  Greater than 50%  of this time was spent counseling and coordinating care related to the above assessment and plan.  Signed by: Asencion Gowda, NP   Please contact Palliative Medicine Team phone at [phone] for questions and concerns.  For individual provider: See Shea Evans

## 2017-11-12 NOTE — Progress Notes (Signed)
**Note De-Identified [place] Obfuscation** Patient discharged to hospice of [facility]  report called to nurse Maudie Mercury. Patient transported by [facility] services discharge paperwork sent with [facility] staff. Family at bedside and aware of d/c plan.  Laurene Melendrez, Tivis Ringer, RN

## 2017-11-12 NOTE — Plan of Care (Signed)
**Note De-identified [place] Obfuscation**  **Note De-Identified [place] Obfuscation** Problem: Education: Goal: Knowledge of General Education information will improve Description Including pain rating scale, medication(s)/side effects and non-pharmacologic comfort measures Outcome: Completed/Met   Problem: Health Behavior/Discharge Planning: Goal: Ability to manage health-related needs will improve Outcome: Completed/Met   Problem: Clinical Measurements: Goal: Ability to maintain clinical measurements within normal limits will improve Outcome: Completed/Met Goal: Will remain free from infection Outcome: Completed/Met Goal: Diagnostic test results will improve Outcome: Completed/Met Goal: Respiratory complications will improve Outcome: Completed/Met Goal: Cardiovascular complication will be avoided Outcome: Completed/Met   Problem: Activity: Goal: Risk for activity intolerance will decrease Outcome: Completed/Met   Problem: Nutrition: Goal: Adequate nutrition will be maintained Outcome: Completed/Met   Problem: Coping: Goal: Level of anxiety will decrease Outcome: Completed/Met   Problem: Elimination: Goal: Will not experience complications related to bowel motility Outcome: Completed/Met Goal: Will not experience complications related to urinary retention Outcome: Completed/Met   Problem: Pain Managment: Goal: General experience of comfort will improve Outcome: Completed/Met   Problem: Safety: Goal: Ability to remain free from injury will improve Outcome: Completed/Met   Problem: Skin Integrity: Goal: Risk for impaired skin integrity will decrease Outcome: Completed/Met

## 2017-11-12 NOTE — Clinical Social Work Note (Signed)
**Note De-Identified [place] Obfuscation** Clinical Social Work Assessment  Patient Details  Name: Marc Wilson MRN: [id number] Date of Birth: Dec 18, 1955  Date of referral:  11/12/17               Reason for consult:  End of Life/Hospice                Permission sought to share information with:  Facility Sport and exercise psychologist, Family Supports Permission granted to share information::  Yes, Verbal Permission Granted  Name::     [name]  Agency::  [facility]  Relationship::  Daughter  Contact Information:     Housing/Transportation Living arrangements for the past 2 months:  Single Family Home Source of Information:  Adult Children, Medical Team Patient Interpreter Needed:  None Criminal Activity/Legal Involvement Pertinent to Current Situation/Hospitalization:  No - Comment as needed Significant Relationships:  Adult Children Lives with:  Self Do you feel safe going back to the place where you live?  Yes Need for family participation in patient care:  Yes (Comment)  Care giving concerns:  Patient is at end of life and family is requesting transition to residential hospice.   Social Worker assessment / plan:  CSW alerted that patient is at end of life and requesting transition to residential hospice in [place] near family. CSW assisted in coordinating discharge to residential hospice.  Employment status:    Insurance information:  Self Pay (Medicaid Pending) PT Recommendations:  Not assessed at this time Information / Referral to community resources:  Other (Comment Required)  Patient/Family's Response to care:  Patient's family agreeable to transfer to hospice.  Patient/Family's Understanding of and Emotional Response to Diagnosis, Current Treatment, and Prognosis:  Patient's daughter is aware that her father is at end of life and is at peace with the decision. Patient's daughter just wants the patient to be comfortable and close to family.  Emotional Assessment Appearance:  Appears stated  age Attitude/Demeanor/Rapport:  Unable to Assess Affect (typically observed):  Unable to Assess Orientation:  Oriented to Self Alcohol / Substance use:  Not Applicable Psych involvement (Current and /or in the community):  No (Comment)  Discharge Needs  Concerns to be addressed:  Care Coordination Readmission within the last 30 days:  No Current discharge risk:  Terminally ill Barriers to Discharge:  No Barriers Identified   Geralynn Ochs, LCSW 11/12/2017, 2:09 PM

## 2017-11-12 NOTE — Discharge Summary (Signed)
**Note De-Identified [place] Obfuscation** DISCHARGE SUMMARY  Marc Wilson  MR#: [id number]  DOB:[birth year]  Date of Admission: 11/11/2017 Date of Discharge: 11/12/2017  Attending Physician:Praise Stennett Hennie Duos, MD  Patient's NTI:[id number], Koleen Distance, DO  Consults: Palliative Care Neurosurgery   Disposition: D/C to Residential Hospice facility   Discharge Diagnoses: Acute L parieto-occipital SDH Alcoholic Cirrhosis of the liver 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Portal vein thrombosis COPD HTN Severe protein-calorie malnutrition   Initial presentation: 62yo M w/ a hx of cirrhosis secondary to alcohol and hepatitis C, hepatocellular adenocarcinoma with lung metastases, ascites, portal vein thrombosis on chronic Coumadin, ongoing tobacco abuse, COPD, severe protein calorie malnutrition, and a recent hospital admission secondary to left lower extremity cellulitis who presented to the hospital secondary to vomiting blood.  Family reported he had been sick on his stomach w/ multiple episodes of vomiting demonstrating bright red blood with stomach contents. He also experienced a mechanical fall 2 days prior to admission in which he hit his head, but did not seek medical attention.    In the ED he was found to have a L parieto-occipital acute SDH. He was transferred to [facility] for further evaluation and treatment.    Hospital Course: After arrival at [facility], Palliative Care was consulted.  The patient's only child was able to meet w/ the Palliative Care team.  As his POA she was able to confirm that he would not desire further active care.  PC assisted in arranging for his transfer to [facility].  His care has been transitioned to full comfort care only.    Allergies as of 11/12/2017   No Known Allergies     Medication List    STOP taking these medications   albuterol 108 (90 Base) MCG/ACT inhaler Commonly known as:  PROVENTIL HFA;VENTOLIN HFA   doxycycline 100 MG capsule Commonly known as:   VIBRAMYCIN   furosemide 20 MG tablet Commonly known as:  LASIX   LAXATIVE PO   potassium chloride SA 20 MEQ tablet Commonly known as:  K-DUR,KLOR-CON   spironolactone 25 MG tablet Commonly known as:  ALDACTONE   umeclidinium-vilanterol 62.5-25 MCG/INH Aepb Commonly known as:  ANORO ELLIPTA   warfarin 5 MG tablet Commonly known as:  COUMADIN     TAKE these medications   acetaminophen 325 MG tablet Commonly known as:  TYLENOL Take 2 tablets (650 mg total) by mouth every 6 (six) hours as needed for mild pain (or Fever >/= 101).   acetaminophen 650 MG suppository Commonly known as:  TYLENOL Place 1 suppository (650 mg total) rectally every 6 (six) hours as needed for mild pain (or Fever >/= 101).   glycopyrrolate 0.2 MG/ML injection Commonly known as:  ROBINUL Inject 1 mL (0.2 mg total) into the vein every 4 (four) hours as needed (excessive secretions).   haloperidol lactate 5 MG/ML injection Commonly known as:  HALDOL Inject 0.1 mLs (0.5 mg total) into the vein every 4 (four) hours as needed (or delirium).   LORazepam 2 MG/ML injection Commonly known as:  ATIVAN Inject 0.25 mLs (0.5 mg total) into the vein every 6 (six) hours as needed for anxiety.   morphine 2 MG/ML injection Inject 0.5 mLs (1 mg total) into the vein every 2 (two) hours as needed (or dyspnea).   nicotine 21 mg/24hr patch Commonly known as:  NICODERM CQ - dosed in mg/24 hours Place 1 patch (21 mg total) onto the skin daily. Start taking on:  2017-11-22   ondansetron 4 MG disintegrating tablet **Note De-Identified [place] Obfuscation** Commonly known as:  ZOFRAN-ODT Take 1 tablet (4 mg total) by mouth every 6 (six) hours as needed for nausea.   ondansetron 4 MG/2ML Soln injection Commonly known as:  ZOFRAN Inject 2 mLs (4 mg total) into the vein every 6 (six) hours as needed for nausea.   polyvinyl alcohol 1.4 % ophthalmic solution Commonly known as:  LIQUIFILM TEARS Place 1 drop into both eyes 4 (four) times daily as needed for dry  eyes.       Day of Discharge BP 122/82 (BP Location: Left Arm)   Pulse 95   Temp 98.1 F (36.7 C) (Oral)   Resp 17   Ht 5\' 7"  (1.702 m)   Wt 74.1 kg   SpO2 93%   BMI 25.59 kg/m   Pt was resting comfortably at the time of his D/C.  Basic Metabolic Panel: Recent Labs  Lab 11/11/17 0432 11/11/17 1330 11/12/17 0223  NA 128*  --  132*  K 5.0  --  5.9*  CL 97*  --  99  CO2 20*  --  20*  GLUCOSE 105*  --  74  BUN 33*  --  38*  CREATININE 1.17  --  1.45*  CALCIUM 8.4*  --  8.3*  MG  --  2.3  --   PHOS  --  6.2*  --     Liver Function Tests: Recent Labs  Lab 11/11/17 0432 11/12/17 0223  AST 209* 289*  ALT 32 46*  ALKPHOS 221* 220*  BILITOT 2.9* 3.1*  PROT 7.0 6.0*  ALBUMIN 2.4* 2.2*    Recent Labs  Lab 11/11/17 0433  AMMONIA 70*   Coags: Recent Labs  Lab 11/11/17 0432 11/11/17 0937 11/11/17 1437 11/11/17 2030 11/12/17 0223  INR 2.54 1.50 1.39 1.32 1.26   CBC: Recent Labs  Lab 11/06/17 0555 11/07/17 0613 11/11/17 0432 11/11/17 1330 11/12/17 0223  WBC 12.7* 13.7* 18.9* 22.3* 18.6*  NEUTROABS  --   --  17.2*  --   --   HGB 11.2* 11.2* 10.2* 10.5* 10.6*  HCT 34.5* 34.5* 32.0* 33.1* 33.9*  MCV 90.1 90.1 90.7 91.7 92.9  PLT 166 164 208 196 236    Recent Results (from the past 240 hour(s))  Culture, blood (routine x 2)     Status: None   Collection Time: 11/02/17  5:44 PM  Result Value Ref Range Status   Specimen Description BLOOD LEFT ARM  Final   Special Requests   Final    BOTTLES DRAWN AEROBIC AND ANAEROBIC Blood Culture adequate volume   Culture   Final    NO GROWTH 5 DAYS Performed at [facility], [address] [place], [place] [address]    Report Status 11/07/2017 FINAL  Final  Culture, blood (routine x 2)     Status: None   Collection Time: 11/02/17  5:51 PM  Result Value Ref Range Status   Specimen Description BLOOD RIGHT HAND  Final   Special Requests   Final    BOTTLES DRAWN AEROBIC ONLY Blood Culture results may not be  optimal due to an inadequate volume of blood received in culture bottles   Culture   Final    NO GROWTH 5 DAYS Performed at [facility], [address], [place], [place] [address]    Report Status 11/07/2017 FINAL  Final  Urine culture     Status: None   Collection Time: 11/11/17  8:41 AM  Result Value Ref Range Status   Specimen Description **Note De-Identified [place] Obfuscation** Final    URINE, CLEAN CATCH Performed at [facility], [address], [place], [place] [address]    Special Requests   Final    NONE Performed at [facility], [address] [place], [place] [address]    Culture   Final    NO GROWTH Performed at [facility], [place] [address], [place], [place] [address]    Report Status 11/12/2017 FINAL  Final     Time spent in discharge (includes decision making & examination of pt): 30 minutes  11/12/2017, 1:55 PM   Cherene Altes, MD [facility] Office  [phone] Pager [phone]  On-Call/Text Page:      [email or web address]      password [facility]

## 2017-11-12 NOTE — Discharge Instructions (Signed)
**Note De-Identified [place] Obfuscation** Hospice Introduction Hospice is a service that is designed to provide people who are terminally ill and their families with medical, spiritual, and psychological support. Its aim is to improve your quality of life by keeping you as alert and comfortable as possible. Who will be my providers when I begin hospice care? Hospice teams often include:  A nurse.  A doctor. The hospice doctor will be available for your care, but you can bring your regular doctor or nurse practitioner.  Social workers.  Religious leaders (such as a Clinical biochemist).  Trained volunteers.  What roles will providers play in my care? Hospice is performed by a team of health care professionals and volunteers who:  Help keep you comfortable: ? Hospice can be provided in your home or in a homelike setting. ? The hospice staff works with your family and friends to help meet your needs. ? You will enjoy the support of loved ones by receiving much of your basic care from family and friends.  Provide pain relief and manage your symptoms. The staff supply all necessary medicines and equipment.  Provide companionship when you are alone.  Allow you and your family to rest. They may do light housekeeping, prepare meals, and run errands.  Provide counseling. They will make sure your emotional, spiritual, and social needs and those of your family are being met.  Provide spiritual care: ? Spiritual care will be individualized to meet your needs and your family's needs. ? Spiritual care may involve:  Helping you look at what death means to you.  Helping you say goodbye to your family and friends.  Performing a specific religious ceremony or ritual.  When should hospice care begin? Most people who use hospice are believed to have fewer than 6 months to live.  Your family and health care providers can help you decide when hospice services should begin.  If your condition improves, you may discontinue the program.  What  should I consider before selecting a program? Most hospice programs are run by nonprofit, independent organizations. Some are affiliated with hospitals, nursing homes, or home health care agencies. Hospice programs can take place in the home or at a hospice center, hospital, or skilled nursing facility. When choosing a hospice program, ask the following questions:  What services are available to me?  What services will be offered to my loved ones?  How involved will my loved ones be?  How involved will my health care provider be?  Who makes up the hospice care team? How are they trained or screened?  How will my pain and symptoms be managed?  If my circumstances change, can the services be provided in a different setting, such as my home or in the hospital?  Is the program reviewed and licensed by the state or certified in some other way?  Where can I learn more about hospice? You can learn about existing hospice programs in your area from your health care providers. You can also read more about hospice online. The websites of the following organizations contain helpful information:  The [facility] and Palliative Care Organization [facility]).  The Hospice Association of America ([place]).  The [employer].  The American Cancer Society (ACS).  Hospice Net.  This information is not intended to replace advice given to you by your health care provider. Make sure you discuss any questions you have with your health care provider. Document Released: 07/07/2003 Document Revised: 11/04/2015 Document Reviewed: 01/28/2013 Elsevier Interactive Patient Education  2017 [employer].

## 2017-11-13 ENCOUNTER — Ambulatory Visit (HOSPITAL_COMMUNITY): Payer: Self-pay | Admitting: Hematology

## 2017-11-23 ENCOUNTER — Ambulatory Visit: Payer: Self-pay | Admitting: Family

## 2017-11-27 ENCOUNTER — Ambulatory Visit: Payer: Self-pay | Admitting: Internal Medicine

## 2017-12-02 DEATH — deceased

## 2018-07-30 IMAGING — US IR PARACENTESIS
1 series · 4 of 4 positions shown · non-contrast
Comparison: none

INDICATION: Recurrent ascites likely due to hepatocellular carcinoma with
metastasis. Request for therapeutic paracentesis.

[Series 1: ir (id) (id)/(id)/(id) ir · 4 of 4 slices shown]
[im 1/4]
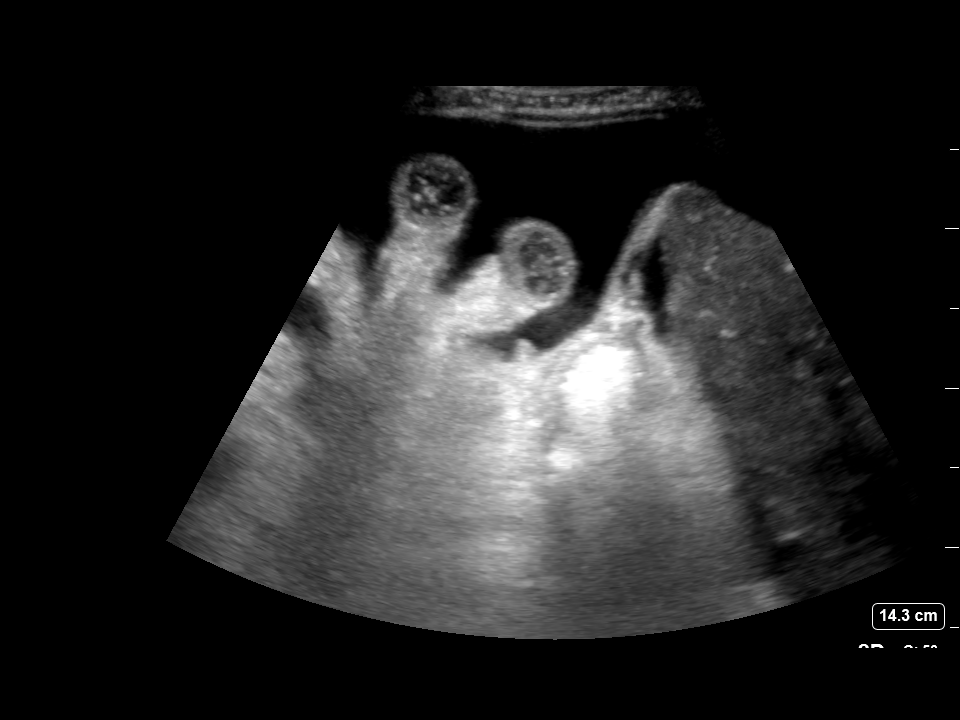
[im 2/4]
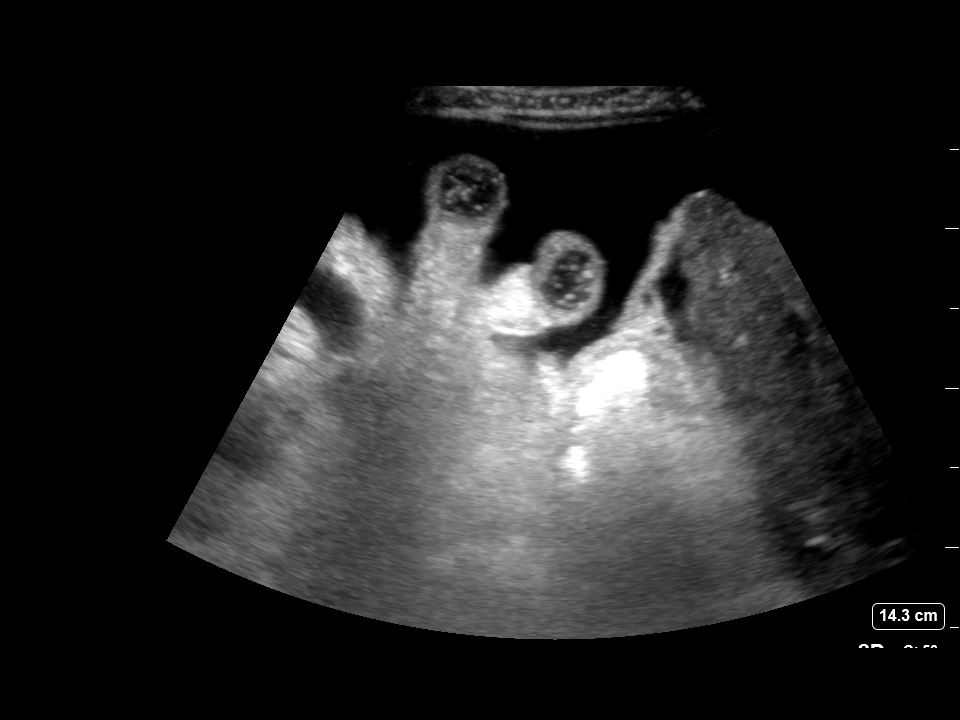
[im 3/4]
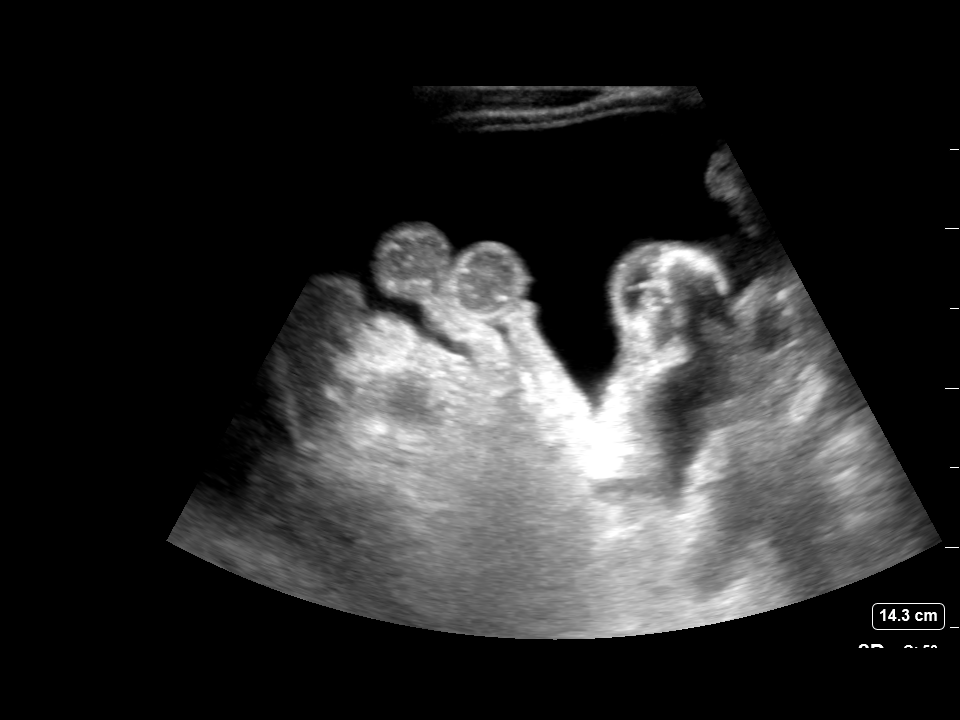
[im 4/4]
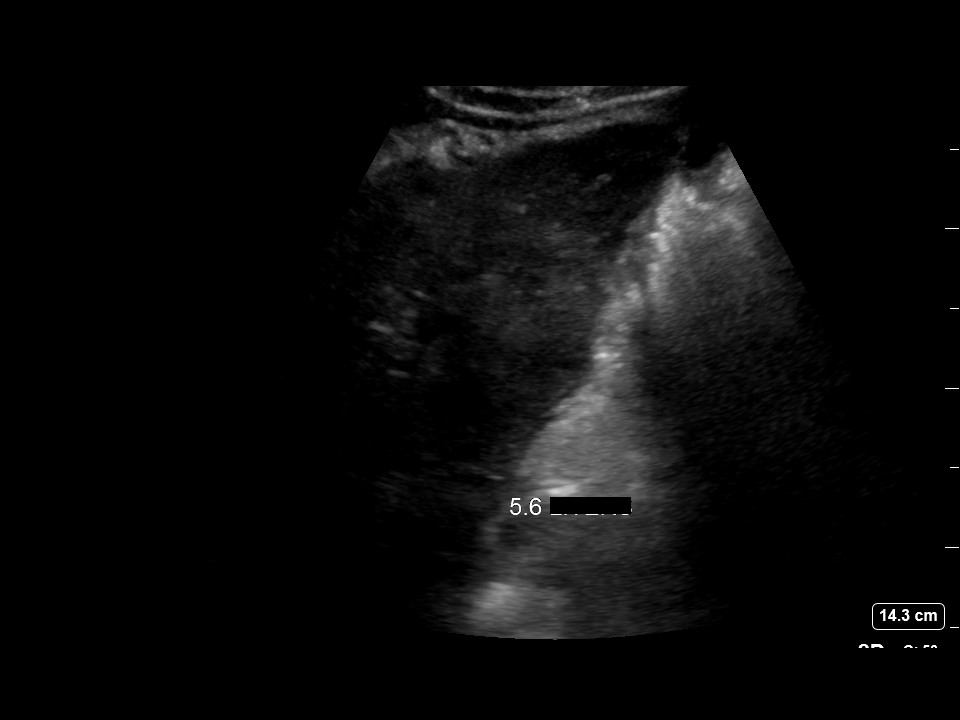

[4 of 4 positions shown; findings below may reference images not displayed]

EXAM:
ULTRASOUND GUIDED THERAPEUTIC PARACENTESIS

MEDICATIONS:
10 mL 2% lidocaine.

COMPLICATIONS:
None immediate.

PROCEDURE:
Informed written consent was obtained from the patient after a
discussion of the risks, benefits and alternatives to treatment. A
timeout was performed prior to the initiation of the procedure.

Initial ultrasound scanning demonstrates a large amount of ascites
within the right lower abdominal quadrant. The right lower abdomen
was prepped and draped in the usual sterile fashion. 2% was used for
local anesthesia.

Following this, a 19 gauge, 7-cm, Yueh catheter was introduced. An
ultrasound image was saved for documentation purposes. The
paracentesis was performed. The catheter was removed and a dressing
was applied. The patient tolerated the procedure well without
immediate post procedural complication.
FINDINGS: A total of approximately 5.6L of yellow fluid was removed. Samples
were not sent to the laboratory as requested by the clinical team.
IMPRESSION: Successful ultrasound-guided paracentesis yielding 5.6 liters of
peritoneal fluid.

Read by Carchalli, Anar Qurbali

## 2018-08-11 IMAGING — CT CT ABD-PELV W/ CM
4 of 15 series · 15 of 47 positions shown, 16 images · IV contrast (Isovue)
Comparison: MRI abdomen dated 10/30/2017. CT chest dated
10/14/2017.

CLINICAL DATA: Nausea/vomiting/diarrhea, weakness, altered mental
status. Recently diagnosed hepatocellular carcinoma with multifocal
lung metastases

EXAM:
CT ABDOMEN AND PELVIS WITH CONTRAST
TECHNIQUE: Multidetector CT imaging of the abdomen and pelvis was performed
using the standard protocol following bolus administration of
intravenous contrast.
CONTRAST:  100mL T2XHO7-XVU IOPAMIDOL (T2XHO7-XVU) INJECTION 76%

[Series 6: ax thin · axial · 0.38mm/px · z∈[+1491,+1638]mm · 8 of 192 slices shown]
[im 22/192  brain]
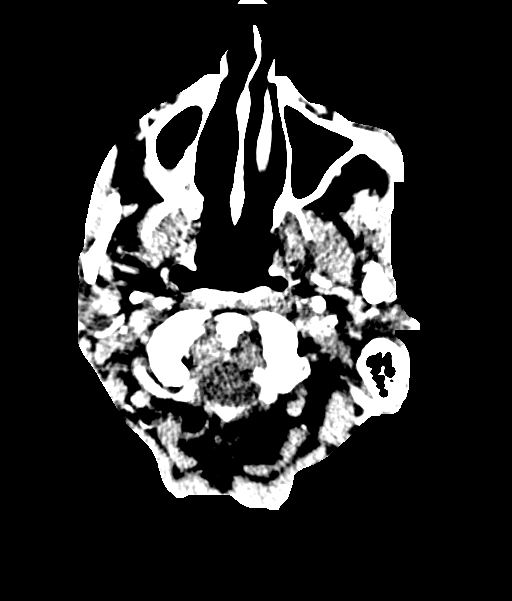
[im 43/192  brain]
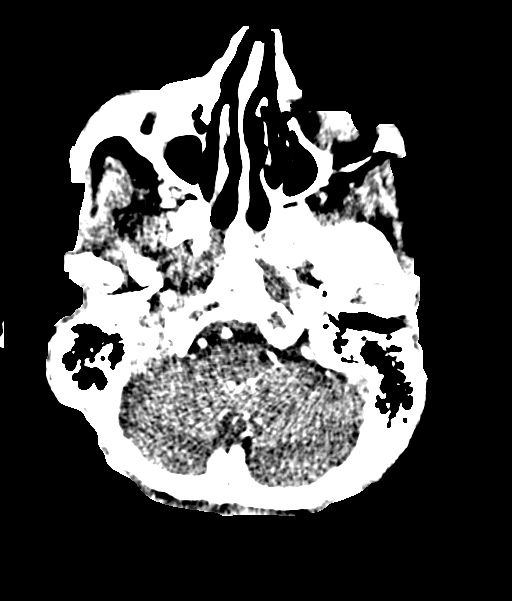
[im 64/192  brain]
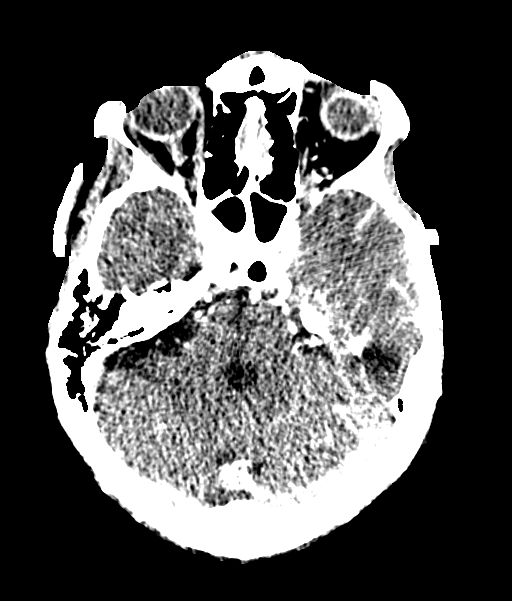
[im 85/192  brain]
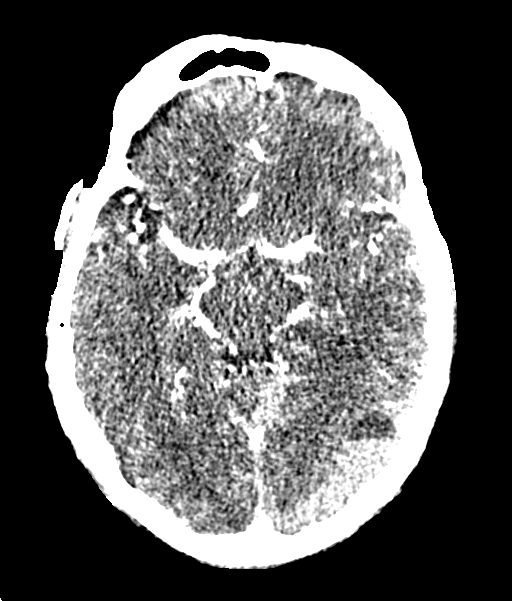
[im 107/192  brain]
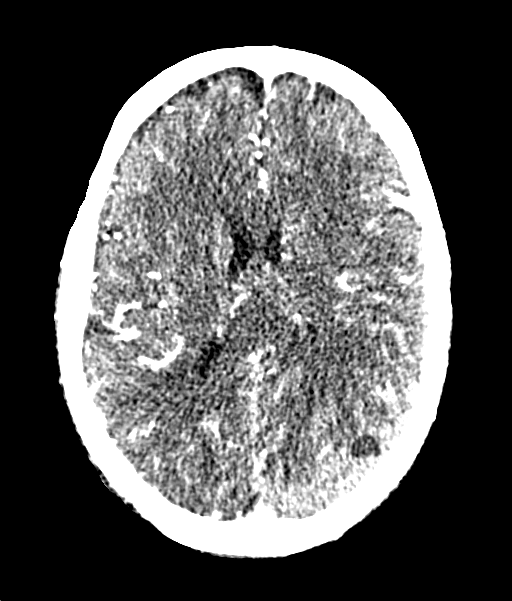
[im 128/192  brain]
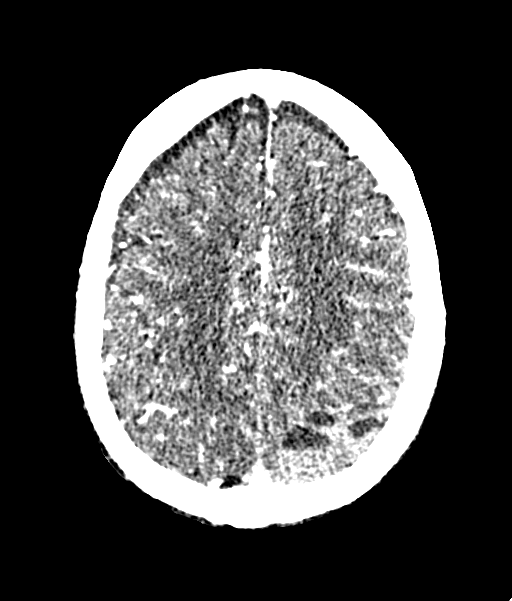
[im 149/192  brain]
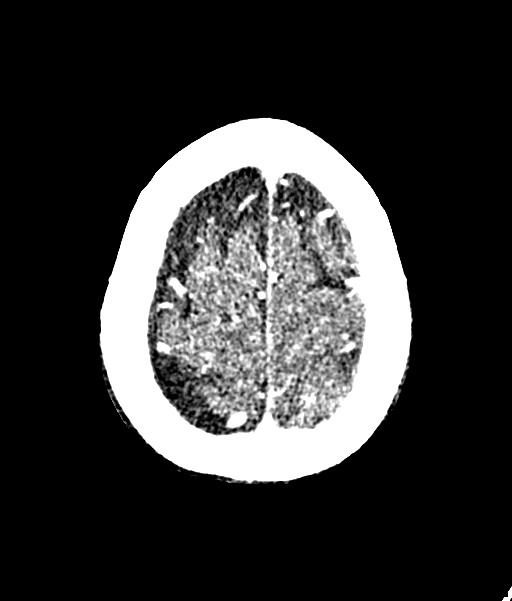
[im 170/192  brain]
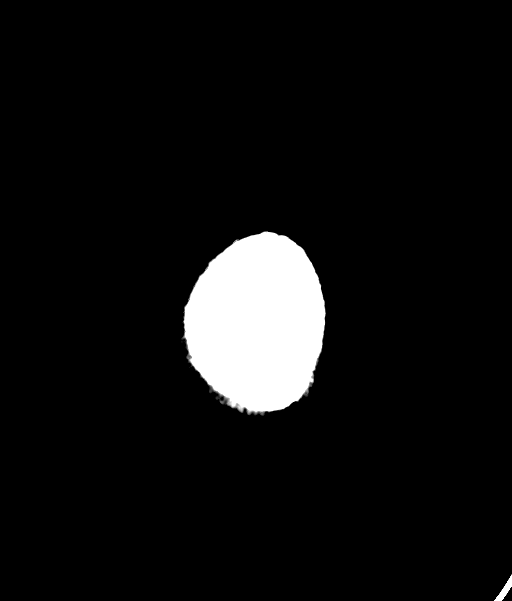

[Series 8: cor thin · coronal · 0.38mm/px · 2 of 225 slices shown]
[im 75/225  brain]
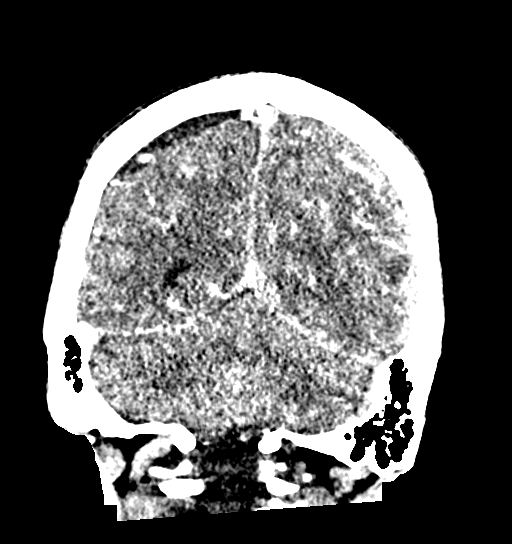
[im 150/225  brain]
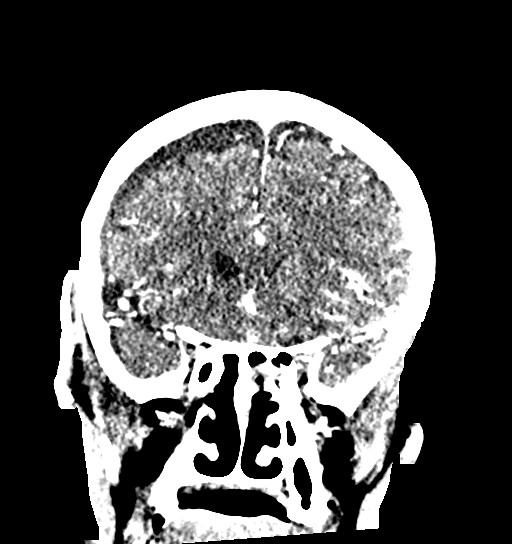

[Series 10: sag thin · sagittal · 0.37mm/px · 1 of 201 slices shown]
[im 101/201  brain]
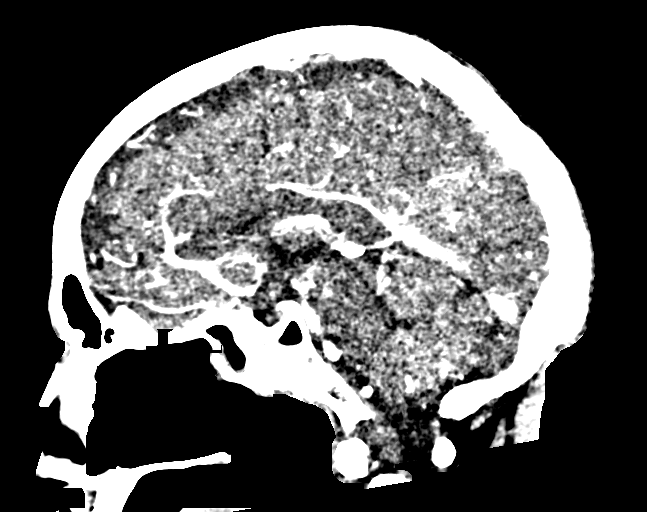

[Series 12: axial st · axial · 0.78mm/px · z∈[+777,+1127]mm · 4 of 118 slices shown, 5 images]
[im 24/118  brain]
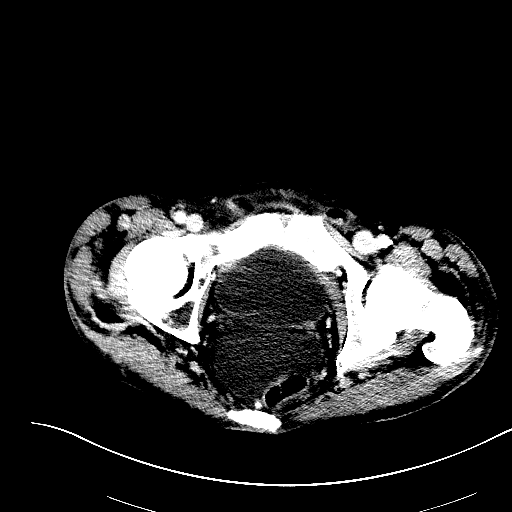
[im 24/118  bone]
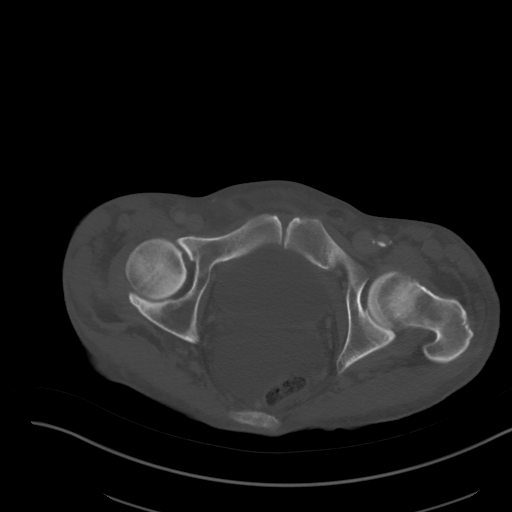
[im 47/118  brain]
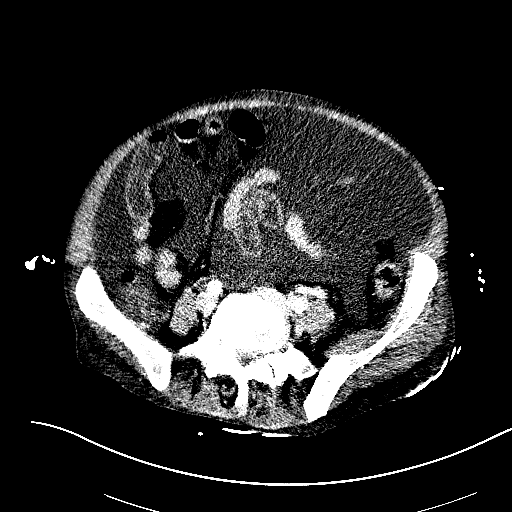
[im 71/118  brain]
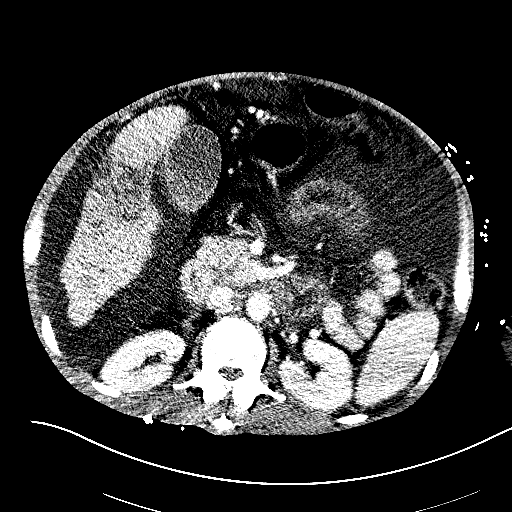
[im 94/118  brain]
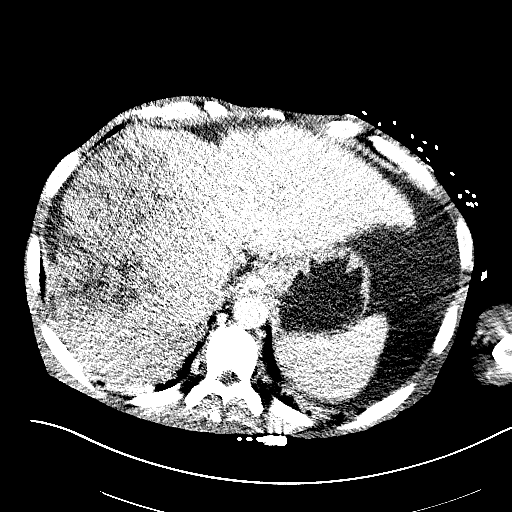

[15 of 47 positions shown; findings below may reference images not displayed]

FINDINGS: Lower chest: Innumerable pulmonary metastases throughout the lungs.
Two dominant right lower lobe nodules measure 2.2 and 2.6 cm (series
14/image 16).

Hepatobiliary: Multifocal/aggregate right hepatic mass measuring at
least 16.9 cm in the right hepatic lobe (series 12/image 30),
corresponding to suspected hepatocellular carcinoma, better
evaluated on prior MRI. Underlying cirrhosis.

Gallbladder is unremarkable. No intrahepatic or extrahepatic ductal
dilatation.

Pancreas: Within normal limits.

Spleen: Within normal limits.

Adrenals/Urinary Tract: Adrenal glands are within normal limits.

9 mm cyst in the anterior left lower kidney. Right kidney is within
normal limits. No hydronephrosis.

Bladder is within normal limits.

Stomach/Bowel: Stomach is within normal limits.

No evidence of bowel obstruction.

Normal appendix (series 12/image 77).

Vascular/Lymphatic: No evidence of abdominal aortic aneurysm.

Atherosclerotic calcifications the abdominal aorta and branch
vessels.

Right portal vein thrombosis (series 12/image 37), likely secondary
to tumor involvement.

Gastroesophageal varices.  Recanalized periumbilical vein.

No suspicious abdominopelvic lymphadenopathy.

Reproductive: Prostate is unremarkable.

Other: Large volume abdominopelvic ascites.

Musculoskeletal: Degenerative changes of the lumbar spine.
IMPRESSION: Large right hepatic mass, corresponding to suspected hepatocellular
carcinoma, better evaluated on prior MRI.

Right portal vein thrombosis of the right portal vein, likely
secondary to tumor involvement.

Cirrhosis. Gastroesophageal varices. Large volume abdominopelvic
ascites.

Innumerable pulmonary metastases, incompletely visualized.
